# Patient Record
Sex: Male | Born: 1935
Health system: Southern US, Community
[De-identification: ages and names within clinical notes are randomized; demographics above are authoritative.]

## PROBLEM LIST (undated history)

## (undated) ENCOUNTER — Emergency Department (HOSPITAL_BASED_OUTPATIENT_CLINIC_OR_DEPARTMENT_OTHER): Payer: Medicare Other

## (undated) DIAGNOSIS — R768 Other specified abnormal immunological findings in serum: Secondary | ICD-10-CM

## (undated) DIAGNOSIS — I480 Paroxysmal atrial fibrillation: Secondary | ICD-10-CM

## (undated) DIAGNOSIS — D126 Benign neoplasm of colon, unspecified: Secondary | ICD-10-CM

## (undated) DIAGNOSIS — I10 Essential (primary) hypertension: Secondary | ICD-10-CM

## (undated) DIAGNOSIS — K5790 Diverticulosis of intestine, part unspecified, without perforation or abscess without bleeding: Secondary | ICD-10-CM

## (undated) DIAGNOSIS — G4733 Obstructive sleep apnea (adult) (pediatric): Secondary | ICD-10-CM

## (undated) HISTORY — DX: Essential (primary) hypertension: I10

## (undated) HISTORY — DX: Hemochromatosis, unspecified: E83.119

## (undated) HISTORY — DX: Obstructive sleep apnea (adult) (pediatric): G47.33

## (undated) HISTORY — DX: Diverticulosis of intestine, part unspecified, without perforation or abscess without bleeding: K57.90

## (undated) HISTORY — PX: KNEE ARTHROSCOPY: SUR90

## (undated) HISTORY — DX: Paroxysmal atrial fibrillation: I48.0

## (undated) HISTORY — DX: Benign neoplasm of colon, unspecified: D12.6

## (undated) HISTORY — PX: TONSILLECTOMY: SUR1361

## (undated) HISTORY — DX: Other specified abnormal immunological findings in serum: R76.8

## (undated) HISTORY — PX: COLONOSCOPY: SHX174

---

## 1987-10-08 ENCOUNTER — Encounter: Payer: Self-pay | Admitting: Gastroenterology

## 1988-11-10 ENCOUNTER — Encounter: Payer: Self-pay | Admitting: Gastroenterology

## 1988-11-24 ENCOUNTER — Encounter: Payer: Self-pay | Admitting: Gastroenterology

## 1990-06-18 ENCOUNTER — Encounter: Payer: Self-pay | Admitting: Gastroenterology

## 1993-10-03 DIAGNOSIS — D126 Benign neoplasm of colon, unspecified: Secondary | ICD-10-CM

## 1993-10-03 HISTORY — DX: Benign neoplasm of colon, unspecified: D12.6

## 1993-10-04 ENCOUNTER — Encounter: Payer: Self-pay | Admitting: Gastroenterology

## 1993-10-05 ENCOUNTER — Encounter: Payer: Self-pay | Admitting: Gastroenterology

## 1996-12-30 ENCOUNTER — Encounter: Payer: Self-pay | Admitting: Gastroenterology

## 2001-05-24 ENCOUNTER — Encounter (INDEPENDENT_AMBULATORY_CARE_PROVIDER_SITE_OTHER): Payer: Self-pay | Admitting: *Deleted

## 2002-02-27 ENCOUNTER — Encounter: Payer: Self-pay | Admitting: Gastroenterology

## 2002-04-07 ENCOUNTER — Encounter: Payer: Self-pay | Admitting: Gastroenterology

## 2006-04-14 ENCOUNTER — Encounter: Admission: RE | Admit: 2006-04-14 | Discharge: 2006-04-14 | Payer: Self-pay | Admitting: Orthopedic Surgery

## 2007-06-04 ENCOUNTER — Ambulatory Visit: Payer: Self-pay | Admitting: Gastroenterology

## 2007-06-11 ENCOUNTER — Ambulatory Visit: Payer: Self-pay | Admitting: Gastroenterology

## 2007-06-11 ENCOUNTER — Encounter: Payer: Self-pay | Admitting: Gastroenterology

## 2008-05-08 HISTORY — PX: NM MYOCAR PERF WALL MOTION: HXRAD629

## 2009-03-23 ENCOUNTER — Encounter: Payer: Self-pay | Admitting: Physician Assistant

## 2009-03-24 ENCOUNTER — Telehealth: Payer: Self-pay | Admitting: Gastroenterology

## 2009-03-25 ENCOUNTER — Ambulatory Visit: Payer: Self-pay | Admitting: Gastroenterology

## 2009-03-25 DIAGNOSIS — K5730 Diverticulosis of large intestine without perforation or abscess without bleeding: Secondary | ICD-10-CM | POA: Insufficient documentation

## 2009-03-25 DIAGNOSIS — I10 Essential (primary) hypertension: Secondary | ICD-10-CM | POA: Insufficient documentation

## 2009-03-25 DIAGNOSIS — Z860100 Personal history of colon polyps, unspecified: Secondary | ICD-10-CM | POA: Insufficient documentation

## 2009-03-25 DIAGNOSIS — R1011 Right upper quadrant pain: Secondary | ICD-10-CM

## 2009-03-29 ENCOUNTER — Ambulatory Visit (HOSPITAL_COMMUNITY): Admission: RE | Admit: 2009-03-29 | Discharge: 2009-03-29 | Payer: Self-pay | Admitting: Gastroenterology

## 2009-03-31 ENCOUNTER — Encounter: Payer: Self-pay | Admitting: Physician Assistant

## 2009-03-31 DIAGNOSIS — K8020 Calculus of gallbladder without cholecystitis without obstruction: Secondary | ICD-10-CM | POA: Insufficient documentation

## 2009-04-01 ENCOUNTER — Encounter: Payer: Self-pay | Admitting: Gastroenterology

## 2009-04-05 HISTORY — PX: LAPAROSCOPIC CHOLECYSTECTOMY: SUR755

## 2009-04-23 ENCOUNTER — Encounter (INDEPENDENT_AMBULATORY_CARE_PROVIDER_SITE_OTHER): Payer: Self-pay | Admitting: *Deleted

## 2009-04-23 ENCOUNTER — Ambulatory Visit (HOSPITAL_COMMUNITY): Admission: RE | Admit: 2009-04-23 | Discharge: 2009-04-23 | Payer: Self-pay | Admitting: General Surgery

## 2009-05-05 ENCOUNTER — Encounter: Payer: Self-pay | Admitting: Gastroenterology

## 2009-05-21 ENCOUNTER — Encounter: Payer: Self-pay | Admitting: Gastroenterology

## 2009-06-07 ENCOUNTER — Ambulatory Visit: Payer: Self-pay | Admitting: Gastroenterology

## 2009-06-07 DIAGNOSIS — R933 Abnormal findings on diagnostic imaging of other parts of digestive tract: Secondary | ICD-10-CM

## 2009-06-07 DIAGNOSIS — R972 Elevated prostate specific antigen [PSA]: Secondary | ICD-10-CM

## 2009-06-08 LAB — CONVERTED CEMR LAB
INR: 1.3 — ABNORMAL HIGH (ref 0.8–1.0)
PSA: 5.21 ng/mL — ABNORMAL HIGH (ref 0.10–4.00)
Prothrombin Time: 13.1 s — ABNORMAL HIGH (ref 9.1–11.7)

## 2009-06-11 DIAGNOSIS — R740 Nonspecific elevation of levels of transaminase and lactic acid dehydrogenase [LDH]: Secondary | ICD-10-CM

## 2009-06-11 LAB — CONVERTED CEMR LAB
Angiotensin 1 Converting Enzyme: 14 units/L (ref 9–67)
Anti Nuclear Antibody(ANA): POSITIVE — AB
HCV Ab: NEGATIVE
Hepatitis B Surface Ag: NEGATIVE

## 2009-06-23 ENCOUNTER — Encounter: Payer: Self-pay | Admitting: Gastroenterology

## 2009-06-23 ENCOUNTER — Ambulatory Visit (HOSPITAL_COMMUNITY): Admission: RE | Admit: 2009-06-23 | Discharge: 2009-06-23 | Payer: Self-pay | Admitting: Gastroenterology

## 2009-06-29 ENCOUNTER — Telehealth: Payer: Self-pay | Admitting: Gastroenterology

## 2009-07-07 ENCOUNTER — Ambulatory Visit: Payer: Self-pay | Admitting: Gastroenterology

## 2009-07-08 LAB — CONVERTED CEMR LAB
ALT: 36 units/L (ref 0–53)
AST: 37 units/L (ref 0–37)
Alkaline Phosphatase: 68 units/L (ref 39–117)
Bilirubin, Direct: 0.2 mg/dL (ref 0.0–0.3)
Eosinophils Absolute: 0.2 10*3/uL (ref 0.0–0.7)
Eosinophils Relative: 2.3 % (ref 0.0–5.0)
HCT: 43.8 % (ref 39.0–52.0)
Lymphs Abs: 2 10*3/uL (ref 0.7–4.0)
MCHC: 34.9 g/dL (ref 30.0–36.0)
MCV: 93.1 fL (ref 78.0–100.0)
Monocytes Absolute: 0.6 10*3/uL (ref 0.1–1.0)
Platelets: 196 10*3/uL (ref 150.0–400.0)
Prothrombin Time: 13.4 s — ABNORMAL HIGH (ref 9.1–11.7)
Total Bilirubin: 0.7 mg/dL (ref 0.3–1.2)
WBC: 7.6 10*3/uL (ref 4.5–10.5)

## 2009-07-21 ENCOUNTER — Ambulatory Visit: Payer: Self-pay | Admitting: Gastroenterology

## 2009-07-21 LAB — CONVERTED CEMR LAB
Basophils Relative: 0.1 % (ref 0.0–3.0)
Eosinophils Absolute: 0.2 10*3/uL (ref 0.0–0.7)
Hemoglobin: 15.2 g/dL (ref 13.0–17.0)
MCHC: 34.3 g/dL (ref 30.0–36.0)
MCV: 94.7 fL (ref 78.0–100.0)
Monocytes Absolute: 0.5 10*3/uL (ref 0.1–1.0)
Neutro Abs: 4.3 10*3/uL (ref 1.4–7.7)
Neutrophils Relative %: 65.2 % (ref 43.0–77.0)
RBC: 4.68 M/uL (ref 4.22–5.81)
RDW: 11.8 % (ref 11.5–14.6)

## 2009-08-04 ENCOUNTER — Ambulatory Visit: Payer: Self-pay | Admitting: Gastroenterology

## 2009-08-04 LAB — CONVERTED CEMR LAB
Basophils Absolute: 0 10*3/uL (ref 0.0–0.1)
Eosinophils Absolute: 0.1 10*3/uL (ref 0.0–0.7)
Lymphocytes Relative: 23.6 % (ref 12.0–46.0)
MCHC: 33.5 g/dL (ref 30.0–36.0)
Neutrophils Relative %: 65.4 % (ref 43.0–77.0)
RDW: 11.9 % (ref 11.5–14.6)

## 2009-08-18 ENCOUNTER — Ambulatory Visit: Payer: Self-pay | Admitting: Gastroenterology

## 2009-08-18 LAB — CONVERTED CEMR LAB
Basophils Absolute: 0 10*3/uL (ref 0.0–0.1)
Basophils Relative: 0.2 % (ref 0.0–3.0)
Hemoglobin: 14.9 g/dL (ref 13.0–17.0)
Lymphocytes Relative: 21.2 % (ref 12.0–46.0)
Monocytes Relative: 7 % (ref 3.0–12.0)
Neutro Abs: 4.2 10*3/uL (ref 1.4–7.7)
RBC: 4.63 M/uL (ref 4.22–5.81)
RDW: 11.7 % (ref 11.5–14.6)
WBC: 6 10*3/uL (ref 4.5–10.5)

## 2009-09-01 ENCOUNTER — Ambulatory Visit: Payer: Self-pay | Admitting: Gastroenterology

## 2009-09-01 LAB — CONVERTED CEMR LAB
Basophils Absolute: 0 10*3/uL (ref 0.0–0.1)
HCT: 46.3 % (ref 39.0–52.0)
Hemoglobin: 15.4 g/dL (ref 13.0–17.0)
Lymphs Abs: 1.4 10*3/uL (ref 0.7–4.0)
Monocytes Relative: 8.6 % (ref 3.0–12.0)
Neutro Abs: 4.1 10*3/uL (ref 1.4–7.7)
RDW: 11.7 % (ref 11.5–14.6)

## 2009-09-15 ENCOUNTER — Ambulatory Visit: Payer: Self-pay | Admitting: Gastroenterology

## 2009-09-15 LAB — CONVERTED CEMR LAB
Basophils Absolute: 0 10*3/uL (ref 0.0–0.1)
Eosinophils Absolute: 0.1 10*3/uL (ref 0.0–0.7)
HCT: 43.3 % (ref 39.0–52.0)
Lymphs Abs: 1.6 10*3/uL (ref 0.7–4.0)
MCV: 94.2 fL (ref 78.0–100.0)
Monocytes Absolute: 0.5 10*3/uL (ref 0.1–1.0)
Monocytes Relative: 7.2 % (ref 3.0–12.0)
Platelets: 167 10*3/uL (ref 150.0–400.0)
RDW: 12.4 % (ref 11.5–14.6)

## 2009-09-29 ENCOUNTER — Ambulatory Visit: Payer: Self-pay | Admitting: Gastroenterology

## 2009-09-29 LAB — CONVERTED CEMR LAB
Basophils Absolute: 0 10*3/uL
Basophils Relative: 0.4 %
Eosinophils Absolute: 0.1 10*3/uL
Eosinophils Relative: 1.4 %
HCT: 42.4 %
Hemoglobin: 14.4 g/dL
Lymphocytes Relative: 23.3 %
Lymphs Abs: 1.5 10*3/uL
MCHC: 34 g/dL
MCV: 94 fL
Monocytes Absolute: 0.6 10*3/uL
Monocytes Relative: 10.2 %
Neutro Abs: 4.1 10*3/uL
Neutrophils Relative %: 64.7 %
Platelets: 162 10*3/uL
RBC: 4.51 M/uL
RDW: 12 %
WBC: 6.3 10*3/uL

## 2009-10-13 ENCOUNTER — Ambulatory Visit: Payer: Self-pay | Admitting: Gastroenterology

## 2009-10-13 LAB — CONVERTED CEMR LAB
Basophils Relative: 0.5 % (ref 0.0–3.0)
Eosinophils Relative: 1.2 % (ref 0.0–5.0)
HCT: 42 % (ref 39.0–52.0)
Lymphs Abs: 1.2 10*3/uL (ref 0.7–4.0)
MCV: 93.1 fL (ref 78.0–100.0)
Monocytes Absolute: 0.6 10*3/uL (ref 0.1–1.0)
Monocytes Relative: 9.4 % (ref 3.0–12.0)
RBC: 4.51 M/uL (ref 4.22–5.81)
WBC: 6 10*3/uL (ref 4.5–10.5)

## 2009-10-27 ENCOUNTER — Ambulatory Visit: Payer: Self-pay | Admitting: Gastroenterology

## 2009-10-27 LAB — CONVERTED CEMR LAB
Basophils Relative: 0.3 % (ref 0.0–3.0)
Eosinophils Absolute: 0.1 10*3/uL (ref 0.0–0.7)
HCT: 41.2 % (ref 39.0–52.0)
Lymphs Abs: 1.1 10*3/uL (ref 0.7–4.0)
MCHC: 34.8 g/dL (ref 30.0–36.0)
MCV: 93.1 fL (ref 78.0–100.0)
Monocytes Absolute: 0.6 10*3/uL (ref 0.1–1.0)
Neutrophils Relative %: 72.7 % (ref 43.0–77.0)
RBC: 4.42 M/uL (ref 4.22–5.81)

## 2009-11-10 ENCOUNTER — Ambulatory Visit: Payer: Self-pay | Admitting: Gastroenterology

## 2009-11-10 LAB — CONVERTED CEMR LAB
Basophils Absolute: 0 10*3/uL (ref 0.0–0.1)
Basophils Relative: 0.6 % (ref 0.0–3.0)
Eosinophils Absolute: 0.2 10*3/uL (ref 0.0–0.7)
Lymphocytes Relative: 24.3 % (ref 12.0–46.0)
MCHC: 35.1 g/dL (ref 30.0–36.0)
MCV: 92.8 fL (ref 78.0–100.0)
Monocytes Absolute: 0.5 10*3/uL (ref 0.1–1.0)
Neutrophils Relative %: 63.1 % (ref 43.0–77.0)
Platelets: 182 10*3/uL (ref 150.0–400.0)
RBC: 4.42 M/uL (ref 4.22–5.81)
RDW: 12.7 % (ref 11.5–14.6)

## 2009-11-29 ENCOUNTER — Ambulatory Visit: Payer: Self-pay | Admitting: Gastroenterology

## 2009-11-29 LAB — CONVERTED CEMR LAB
Basophils Absolute: 0 10*3/uL (ref 0.0–0.1)
Basophils Relative: 0.3 % (ref 0.0–3.0)
Eosinophils Absolute: 0.3 10*3/uL (ref 0.0–0.7)
Iron: 101 ug/dL (ref 42–165)
Lymphocytes Relative: 24.4 % (ref 12.0–46.0)
MCHC: 34.9 g/dL (ref 30.0–36.0)
MCV: 92.6 fL (ref 78.0–100.0)
Monocytes Absolute: 0.6 10*3/uL (ref 0.1–1.0)
Neutrophils Relative %: 63.3 % (ref 43.0–77.0)
Platelets: 180 10*3/uL (ref 150.0–400.0)
RDW: 12.6 % (ref 11.5–14.6)
Saturation Ratios: 33.2 % (ref 20.0–50.0)
Transferrin: 217.6 mg/dL (ref 212.0–360.0)

## 2009-11-30 ENCOUNTER — Encounter (INDEPENDENT_AMBULATORY_CARE_PROVIDER_SITE_OTHER): Payer: Self-pay | Admitting: *Deleted

## 2009-12-02 ENCOUNTER — Telehealth: Payer: Self-pay | Admitting: Gastroenterology

## 2009-12-13 ENCOUNTER — Ambulatory Visit: Payer: Self-pay | Admitting: Gastroenterology

## 2009-12-13 LAB — CONVERTED CEMR LAB
Basophils Absolute: 0 10*3/uL (ref 0.0–0.1)
Basophils Relative: 0.4 % (ref 0.0–3.0)
Eosinophils Absolute: 0.1 10*3/uL (ref 0.0–0.7)
Hemoglobin: 12.9 g/dL — ABNORMAL LOW (ref 13.0–17.0)
Lymphocytes Relative: 22.6 % (ref 12.0–46.0)
MCHC: 35.3 g/dL (ref 30.0–36.0)
Monocytes Relative: 8.9 % (ref 3.0–12.0)
Neutro Abs: 4.4 10*3/uL (ref 1.4–7.7)
Neutrophils Relative %: 66 % (ref 43.0–77.0)
RBC: 3.93 M/uL — ABNORMAL LOW (ref 4.22–5.81)

## 2010-01-10 ENCOUNTER — Ambulatory Visit: Payer: Self-pay | Admitting: Gastroenterology

## 2010-01-10 LAB — CONVERTED CEMR LAB
Basophils Relative: 0.3 % (ref 0.0–3.0)
HCT: 37.5 % — ABNORMAL LOW (ref 39.0–52.0)
Hemoglobin: 13.1 g/dL (ref 13.0–17.0)
Lymphocytes Relative: 22.1 % (ref 12.0–46.0)
Lymphs Abs: 1.7 10*3/uL (ref 0.7–4.0)
MCHC: 35 g/dL (ref 30.0–36.0)
Monocytes Relative: 7.8 % (ref 3.0–12.0)
Neutro Abs: 5.2 10*3/uL (ref 1.4–7.7)
RBC: 4.02 M/uL — ABNORMAL LOW (ref 4.22–5.81)

## 2010-01-27 ENCOUNTER — Ambulatory Visit: Payer: Self-pay | Admitting: Gastroenterology

## 2010-02-09 ENCOUNTER — Ambulatory Visit: Payer: Self-pay | Admitting: Gastroenterology

## 2010-03-08 ENCOUNTER — Ambulatory Visit: Payer: Self-pay | Admitting: Gastroenterology

## 2010-03-08 LAB — CONVERTED CEMR LAB
Basophils Relative: 0.3 % (ref 0.0–3.0)
Eosinophils Absolute: 0 10*3/uL (ref 0.0–0.7)
Eosinophils Relative: 0.5 % (ref 0.0–5.0)
HCT: 39.8 % (ref 39.0–52.0)
Hemoglobin: 13.6 g/dL (ref 13.0–17.0)
Lymphs Abs: 1 10*3/uL (ref 0.7–4.0)
MCHC: 34.3 g/dL (ref 30.0–36.0)
MCV: 91 fL (ref 78.0–100.0)
Monocytes Absolute: 0.3 10*3/uL (ref 0.1–1.0)
Neutro Abs: 6.5 10*3/uL (ref 1.4–7.7)
RBC: 4.37 M/uL (ref 4.22–5.81)
WBC: 7.8 10*3/uL (ref 4.5–10.5)

## 2010-04-11 ENCOUNTER — Ambulatory Visit: Payer: Self-pay | Admitting: Gastroenterology

## 2010-04-12 LAB — CONVERTED CEMR LAB
Basophils Absolute: 0 10*3/uL (ref 0.0–0.1)
Basophils Relative: 0.5 % (ref 0.0–3.0)
Eosinophils Absolute: 0.1 10*3/uL (ref 0.0–0.7)
MCHC: 34.2 g/dL (ref 30.0–36.0)
MCV: 89.9 fL (ref 78.0–100.0)
Monocytes Absolute: 0.5 10*3/uL (ref 0.1–1.0)
Neutrophils Relative %: 64.3 % (ref 43.0–77.0)
Platelets: 181 10*3/uL (ref 150.0–400.0)
RBC: 4.11 M/uL — ABNORMAL LOW (ref 4.22–5.81)
RDW: 12.9 % (ref 11.5–14.6)

## 2010-05-18 ENCOUNTER — Encounter: Payer: Self-pay | Admitting: Gastroenterology

## 2010-06-17 ENCOUNTER — Other Ambulatory Visit: Payer: Self-pay | Admitting: Gastroenterology

## 2010-06-17 ENCOUNTER — Ambulatory Visit
Admission: RE | Admit: 2010-06-17 | Discharge: 2010-06-17 | Payer: Self-pay | Source: Home / Self Care | Attending: Gastroenterology | Admitting: Gastroenterology

## 2010-06-17 LAB — IBC PANEL
Iron: 58 ug/dL (ref 42–165)
Saturation Ratios: 15.6 % — ABNORMAL LOW (ref 20.0–50.0)
Transferrin: 265 mg/dL (ref 212.0–360.0)

## 2010-06-17 LAB — CBC WITH DIFFERENTIAL/PLATELET
Basophils Absolute: 0 10*3/uL (ref 0.0–0.1)
Basophils Relative: 0.5 % (ref 0.0–3.0)
Eosinophils Absolute: 0.1 10*3/uL (ref 0.0–0.7)
Eosinophils Relative: 1.6 % (ref 0.0–5.0)
HCT: 44.8 % (ref 39.0–52.0)
Hemoglobin: 15.3 g/dL (ref 13.0–17.0)
Lymphocytes Relative: 21.4 % (ref 12.0–46.0)
Lymphs Abs: 1.8 10*3/uL (ref 0.7–4.0)
MCHC: 34 g/dL (ref 30.0–36.0)
MCV: 88.2 fl (ref 78.0–100.0)
Monocytes Absolute: 0.6 10*3/uL (ref 0.1–1.0)
Monocytes Relative: 7.4 % (ref 3.0–12.0)
Neutro Abs: 5.7 10*3/uL (ref 1.4–7.7)
Neutrophils Relative %: 69.1 % (ref 43.0–77.0)
Platelets: 184 10*3/uL (ref 150.0–400.0)
RBC: 5.09 Mil/uL (ref 4.22–5.81)
RDW: 14.6 % (ref 11.5–14.6)
WBC: 8.3 10*3/uL (ref 4.5–10.5)

## 2010-06-17 LAB — FERRITIN: Ferritin: 13.9 ng/mL — ABNORMAL LOW (ref 22.0–322.0)

## 2010-07-07 NOTE — Letter (Signed)
Summary: [facility]   Imported By: Sherian Rein 07/05/2009 10:09:38  _____________________________________________________________________  External Attachment:    Type:   Image     Comment:   External Document

## 2010-07-07 NOTE — Procedures (Signed)
Summary: [facility]   Imported By: Sherian Rein 06/07/2009 12:00:39  _____________________________________________________________________  External Attachment:    Type:   Image     Comment:   External Document

## 2010-07-07 NOTE — Progress Notes (Signed)
Summary: Biopsy Results   Phone Note Call from Patient Call back at Home Phone [phone]   Caller: Patient Call For: Dr. Russella Dar Reason for Call: Talk to Nurse Summary of Call: Pt is calling about biopsy results Initial call taken by: Karna Christmas,  June 29, 2009 11:18 AM  Follow-up for Phone Call        Patient  aware Dr Russella Dar is out of the office this week as soon as he has a chance to review we will call patient back.  Patient would like to come in and see Dr Russella Dar to review.  REV scheduled for 07-07-09 1:45 Follow-up by: Darcey Nora RN, CGRN,  June 29, 2009 11:38 AM

## 2010-07-07 NOTE — Procedures (Signed)
Summary: Colonoscopy/[facility]  Colonoscopy/[facility]   Imported By: Sherian Rein 06/07/2009 11:58:54  _____________________________________________________________________  External Attachment:    Type:   Image     Comment:   External Document

## 2010-07-07 NOTE — Assessment & Plan Note (Signed)
Summary: f/u--ch.   History of Present Illness Visit Type: Follow-up Visit Primary GI MD: Elie Goody MD [facility] Primary Carlo Guevarra: Merri Brunette, MD Requesting Hava Massingale: na Chief Complaint: Patient here to follow up on his hemochromatosis, he states he is doing good.  History of Present Illness:   Andrew [name] returns for followup of microlithiasis. His ferritin and iron saturation have decreased substantially. He is now on monthly phlebotomies. He tolerates the phlebotomies without difficulty. Iron                      101 ug/dL                   65-784 Transferrin               217.6 mg/dL                 696.2-952.8 Iron Saturation           33.2 %        Ferritin                  33.8 ng/mL                  22.0-322.0  White Cell Count          7.7 K/uL                    4.5-10.5   Red Cell Count       [L]  4.02 Mil/uL                 4.22-5.81   Hemoglobin                13.1 g/dL                   41.3-24.4   Hematocrit           [L]  37.5 %                      39.0-52.0   MCV                       93.3 fl                     78.0-100.0   MCHC                      35.0 g/dL                   01.0-27.2   RDW                       12.4 %                      11.5-14.6   Platelet Count            164.0 K/uL                  150.0-400.0   GI Review of Systems      Denies abdominal pain, acid reflux, belching, bloating, chest pain, dysphagia with liquids, dysphagia with solids, heartburn, loss of appetite, nausea, vomiting, vomiting blood, weight loss, and  weight gain.        Denies anal fissure, black tarry stools, change in bowel habit, constipation, diarrhea, diverticulosis, fecal incontinence, heme positive  stool, hemorrhoids, irritable bowel syndrome, jaundice, light color stool, liver problems, rectal bleeding, and  rectal pain.   Current Medications (verified): 1)  Metoprolol Tartrate 50 Mg Tabs (Metoprolol Tartrate) .... Take 1 Tablet By Mouth Two Times A Day 2)   Lisinopril 40 Mg Tabs (Lisinopril) .... Take 1 Tablet By Mouth Once A Day 3)  Fish Oil Capsule (Unknown Dosage) .... Take 1 Capsule By Mouth Once Daily As Needed 4)  Multivitamins  Tabs (Multiple Vitamin) .... Take 1 Tablet By Mouth Once A Day 5)  Amlodipine Besylate 5 Mg Tabs (Amlodipine Besylate) .... Once Daily 6)  Prostate Drug .... Take One By Mouth Once Daily  Allergies (verified): No Known Drug Allergies  Past History:  Past Medical History: Hepatitis B antibody + Hypertension Diverticulosis Adenomatous Colon Polyps 10/1993 Hemochromatosis: COMPOUND HETEROZYGOUS FOR C282Y & H63D MUTATIONS  Past Surgical History: Reviewed history from 06/07/2009 and no changes required. Tonsillectomy Laparoscopic Cholecystectomy 04/2009 Knee Arthroscopy  Family History: Reviewed history from 03/25/2009 and no changes required. No FH of Colon Cancer:  Family History of Esophageal Cancer: Father  Social History: Reviewed history from 03/25/2009 and no changes required. Occupation: Retired Patient is a former smoker. -stopped at age 52 Alcohol Use - yes-2 glasses wine daily Daily Caffeine Use-3 cups daily Illicit Drug Use - no  Review of Systems       The patient complains of urination - excessive.         The pertinent positives and negatives are noted as above and in the HPI. All other ROS were reviewed and were negative.   Vital Signs:  Patient profile:   77 year old male Height:      71 inches Weight:      170.0 pounds BMI:     23.80 Pulse rate:   60 / minute Pulse rhythm:   irregularly irregular BP sitting:   140 / 80  (left arm) Cuff size:   regular  Vitals Entered By: Harlow Mares CMA Duncan Dull) (January 27, 2010 10:53 AM)  Physical Exam  General:  Well developed, well nourished, no acute distress. Head:  Normocephalic and atraumatic. Eyes:  PERRLA, no icterus. Mouth:  No deformity or lesions, dentition normal. Lungs:  Clear throughout to auscultation. Heart:   Regular rate and rhythm; no murmurs, rubs,  or bruits. Abdomen:  Soft, nontender and nondistended. No masses, hepatosplenomegaly or hernias noted. Normal bowel sounds. Neurologic:  Alert and  oriented x4;  grossly normal neurologically. Psych:  Alert and cooperative. Normal mood and affect.  Impression & Recommendations:  Problem # 1:  HEREDITARY HEMOCHROMATOSIS (ICD-275.01) I reviewed the results of his liver biopsy, hepatic serologies and quantitative iron level with him. He has genetic hemochromatosis: COMPOUND HETEROZYGOUS FOR C282Y & H63D MUTATIONS. There is a remote possibility he has superimposed autoimmune hepatitis, but this is less likely. Features of cirrhosis, were not noted on his liver biopsy although his liver appeared abnormal at laparoscopy for cholecystectomy. He is advised to have his first degree relatives screened for hemochromatosis. Continue therapeutic phlebotomies monthly for now. Will change to every other month and quarterly when appropriate with the goal to maintain mild iron deficiency anemia long-term.  Orders: TLB-Hepatic/Liver Function Pnl (80076-HEPATIC) TLB-CBC Platelet - w/Differential (85025-CBCD) TLB-PT (Protime) (85610-PTP) TLB-PTT (85730-PTTL) T-Phlebotomy, Therapuetic (16109)  Problem # 2:  PERSONAL HX COLONIC POLYPS (ICD-V12.72) Surveillance colonoscopy recommended January 2014.  Problem # 3:  DIVERTICULOSIS-COLON (ICD-562.10) Long-term high fiber diet with adequate daily water intake.  Patient Instructions: 1)  Remain  on monthly phlebotomies. 2)  Please schedule a follow-up appointment in 1 year. 3)  Copy sent to : Merri Brunette, MD 4)  The medication list was reviewed and reconciled.  All changed / newly prescribed medications were explained.  A complete medication list was provided to the patient / caregiver.

## 2010-07-07 NOTE — Letter (Signed)
Summary: Office Visit Letter  [facility]  [address] [place], [place] [address]   Phone: [phone]  Fax: [phone]      November 30, 2009 MRN: [id number]   Andrew [name] [address] [place], [place]  [address]   Dear Mr. Sou,   According to our records, it is time for you to schedule a follow-up office visit with [place].   At your convenience, please call [phone] (option #2)to schedule an office visit. If you have any questions, concerns, or feel that this letter is in error, we would appreciate your call.   Sincerely,  Judie Petit T. Russella Dar, M.D.  [facility] [phone]

## 2010-07-07 NOTE — Procedures (Signed)
Summary: Colonoscopy/[facility]  Colonoscopy/[facility]   Imported By: Sherian Rein 06/07/2009 11:57:45  _____________________________________________________________________  External Attachment:    Type:   Image     Comment:   External Document

## 2010-07-07 NOTE — Progress Notes (Signed)
Summary: Lab results   Phone Note Call from Patient Call back at [phone]   Caller: Patient Call For: Juliette Alcide Reason for Call: Lab or Test Results Summary of Call: Caling about Lab results from 11-29-09 Initial call taken by: Karna Christmas,  December 02, 2009 3:49 PM  Follow-up for Phone Call        patient notified, labs were normal Follow-up by: Darcey Nora RN, CGRN,  December 02, 2009 3:53 PM

## 2010-07-07 NOTE — Assessment & Plan Note (Signed)
Summary: follow up from /lk   History of Present Illness Visit Type: follow up Primary GI MD: Elie Goody MD [facility] Primary Provider: Merri Brunette, MD Requesting Provider: Avel Peace, MD Chief Complaint: follow up from cholecystectomy, no complaints History of Present Illness:   Andrew [name] returns following cholecystectomy with intraoperative cholangiogram. Dr. Abbey Chatters saw a diffusely nodular hepatic surface suspicious for cirrhotic changes. In reviewing his record, he has a history of mildly elevated transaminases in the early 1990s with serologies consistent with prior exposure to and subsequent immunity to hepatitis B. He also had a mildly depressed alpha 1 antitrypsin level at 186 as well as an elevated iron saturation 62.9 %, however, his TIBC was low at 251. Blood work performed in October 2010 showed a normal CBC and CMET, except for sodium of 134.   GI Review of Systems      Denies abdominal pain, acid reflux, belching, bloating, chest pain, dysphagia with liquids, dysphagia with solids, heartburn, loss of appetite, nausea, vomiting, vomiting blood, weight loss, and  weight gain.        Denies anal fissure, black tarry stools, change in bowel habit, constipation, diarrhea, diverticulosis, fecal incontinence, heme positive stool, hemorrhoids, irritable bowel syndrome, jaundice, light color stool, liver problems, rectal bleeding, and  rectal pain.   Current Medications (verified): 1)  Metoprolol Tartrate 50 Mg Tabs (Metoprolol Tartrate) .... Take 1 Tablet By Mouth Two Times A Day 2)  Lisinopril 40 Mg Tabs (Lisinopril) .... Take 1 Tablet By Mouth Once A Day 3)  Fish Oil Capsule (Unknown Dosage) .... Take 1 Capsule By Mouth Once Daily As Needed 4)  Multivitamins  Tabs (Multiple Vitamin) .... Take 1 Tablet By Mouth Once A Day  Allergies (verified): No Known Drug Allergies  Past History:  Past Medical History: Hepatitis B  antibody Hypertension Diverticulosis Adenomatous Colon Polyps 10/1993  Past Surgical History: Tonsillectomy Laparoscopic Cholecystectomy 04/2009 Knee Arthroscopy  Family History: Reviewed history from 03/25/2009 and no changes required. No FH of Colon Cancer:  Family History of Esophageal Cancer: Father  Social History: Reviewed history from 03/25/2009 and no changes required. Occupation: Retired Patient is a former smoker. -stopped at age 18 Alcohol Use - yes-2 glasses wine daily Daily Caffeine Use-3 cups daily Illicit Drug Use - no  Review of Systems       The pertinent positives and negatives are noted as above and in the HPI. All other ROS were reviewed and were negative.   Vital Signs:  Patient profile:   77 year old male Height:      71 inches Weight:      181 pounds BMI:     25.34 Pulse rate:   92 / minute Pulse rhythm:   irregular BP sitting:   124 / 82  (left arm) Cuff size:   regular  Vitals Entered By: Francee Piccolo CMA Duncan Dull) (June 07, 2009 8:52 AM)  Physical Exam  General:  Well developed, well nourished, no acute distress. Head:  Normocephalic and atraumatic. Eyes:  PERRLA, no icterus. Ears:  Normal auditory acuity. Mouth:  No deformity or lesions, dentition normal. Neck:  Supple; no masses or thyromegaly. Lungs:  Clear throughout to auscultation. Heart:  Regular rate and rhythm; no murmurs, rubs,  or bruits. Abdomen:  Soft, nontender and nondistended. No masses, hepatosplenomegaly or hernias noted. Normal bowel sounds. Msk:  Symmetrical with no gross deformities. Normal posture. Pulses:  Normal pulses noted. Extremities:  No clubbing, cyanosis, edema or deformities noted. Neurologic:  Alert  and  oriented x4;  grossly normal neurologically. Cervical Nodes:  No significant cervical adenopathy. Inguinal Nodes:  No significant inguinal adenopathy. Psych:  Alert and cooperative. Normal mood and affect.  Impression &  Recommendations:  Problem # 1:  NONSPECIFIC ABN FINDING RAD & OTH EXAM GI TRACT (ICD-793.4) Further evaluation for suspected cirrhosis. Possible hemachromatosis or alpha-1 antitrypsin deficiency. Serological evaluation as below. We discussed the risks, benefits, and alternatives to an ultrasound-guided percutaneous liver biopsy, which will be considered pending results of his blood work. Orders: TLB-Ferritin (82728-FER) TLB-IBC Pnl (Iron/FE;Transferrin) (83550-IBC) TLB-Iron, (Fe) Total (83540-FE) TLB-PT (Protime) (85610-PTP) T-Alpha-1-Antitrypsin Tot (16109-60454) T-AMA [phone]) T-ANA [id number]) T-Anti SMA (57846-96295) T-Ceruloplasmin ([phone]) T-Hepatitis B Surface Antibody (02725-36644) T-Hepatitis B Surface Antigen [phone]) T-Hepatitis C Anti HCV (38756) T-Angiotensin i-Converting Enzyme (43329-51884) T-Hemochromatosis [phone])  Problem # 2:  PERSONAL HX COLONIC POLYPS (ICD-V12.72) Adenomatous colon polyps, initially diagnosed in May 1995. Surveillance colonoscopy recommended January 2014.  Other Orders: TLB-PSA (Prostate Specific Antigen) (84153-PSA)  Patient Instructions: 1)  Get your labs drawn today in the basement.  2)  Please schedule a follow-up appointment in 2 weeks.  3)  Copy sent to : Merri Brunette, MD 4)                        Avel Peace, MD 5)  The medication list was reviewed and reconciled.  All changed / newly prescribed medications were explained.  A complete medication list was provided to the patient / caregiver.

## 2010-07-07 NOTE — Progress Notes (Signed)
[facility]   Imported By: Sherian Rein 06/07/2009 12:05:48  _____________________________________________________________________  External Attachment:    Type:   Image     Comment:   External Document

## 2010-07-07 NOTE — Assessment & Plan Note (Signed)
Summary: discuss liver bx results/sheri   History of Present Illness Visit Type: follow up Primary GI MD: Elie Goody MD [facility] Primary Provider: Merri Brunette, MD Requesting Provider: Avel Peace, MD Chief Complaint: Patient to discuss results of liver biopsy History of Present Illness:   Andrew [name] returns for followup of liver disease. Serologies revealed a mildly elevated ANA at 1:80. Liver biopsy revealed chronic active hepatitis with significant iron deposition. Typical features of autoimmune hepatitis were not identified. Cirrhosis was not identified on liver biopsy. Quantitative hepaticiron level were elevated.   GI Review of Systems      Denies abdominal pain, acid reflux, belching, bloating, chest pain, dysphagia with liquids, dysphagia with solids, heartburn, loss of appetite, nausea, vomiting, vomiting blood, weight loss, and  weight gain.      Reports liver problems.     Denies anal fissure, black tarry stools, change in bowel habit, constipation, diarrhea, diverticulosis, fecal incontinence, heme positive stool, hemorrhoids, irritable bowel syndrome, jaundice, light color stool, rectal bleeding, and  rectal pain.   Current Medications (verified): 1)  Metoprolol Tartrate 50 Mg Tabs (Metoprolol Tartrate) .... Take 1 Tablet By Mouth Two Times A Day 2)  Lisinopril 40 Mg Tabs (Lisinopril) .... Take 1 Tablet By Mouth Once A Day 3)  Fish Oil Capsule (Unknown Dosage) .... Take 1 Capsule By Mouth Once Daily As Needed 4)  Multivitamins  Tabs (Multiple Vitamin) .... Take 1 Tablet By Mouth Once A Day 5)  Amlodipine Besylate 5 Mg Tabs (Amlodipine Besylate) .... Once Daily  Allergies (verified): No Known Drug Allergies  Past History:  Past Medical History: Hepatitis B antibody + Hypertension Diverticulosis Adenomatous Colon Polyps 10/1993 Hemochromatosis: COMPOUND HETEROZYGOUS FOR THE C282Y & H63D MUTATIONS  Past Surgical History: Reviewed history from 06/07/2009 and no  changes required. Tonsillectomy Laparoscopic Cholecystectomy 04/2009 Knee Arthroscopy  Family History: Reviewed history from 03/25/2009 and no changes required. No FH of Colon Cancer:  Family History of Esophageal Cancer: Father  Social History: Reviewed history from 03/25/2009 and no changes required. Occupation: Retired Patient is a former smoker. -stopped at age 9 Alcohol Use - yes-2 glasses wine daily Daily Caffeine Use-3 cups daily Illicit Drug Use - no  Review of Systems       The patient complains of itching and urination - excessive.  The patient denies allergy/sinus, anemia, anxiety-new, arthritis/joint pain, back pain, blood in urine, breast changes/lumps, change in vision, confusion, cough, coughing up blood, depression-new, fainting, fatigue, fever, headaches-new, hearing problems, heart murmur, heart rhythm changes, menstrual pain, muscle pains/cramps, night sweats, nosebleeds, pregnancy symptoms, shortness of breath, skin rash, sleeping problems, sore throat, swelling of feet/legs, swollen lymph glands, thirst - excessive , urination - excessive , urination changes/pain, urine leakage, vision changes, and voice change.    Vital Signs:  Patient profile:   77 year old male Height:      71 inches Weight:      181.25 pounds BMI:     25.37 Pulse rate:   60 / minute Pulse rhythm:   regular BP sitting:   120 / 68  (right arm) Cuff size:   regular  Vitals Entered By: June McMurray CMA Duncan Dull) (July 07, 2009 1:53 PM)  Physical Exam  General:  Well developed, well nourished, no acute distress. Head:  Normocephalic and atraumatic. Mouth:  No deformity or lesions, dentition normal. Lungs:  Clear throughout to auscultation. Heart:  Regular rate and rhythm; no murmurs, rubs,  or bruits. Abdomen:  Soft, nontender and nondistended.  No masses, hepatosplenomegaly or hernias noted. Normal bowel sounds. Psych:  Alert and cooperative. Normal mood and affect.  Impression &  Recommendations:  Problem # 1:  HEREDITARY HEMOCHROMATOSIS (ICD-275.01) I reviewed the results of his liver biopsy, hepatic serologies and quantitative iron level with him. He has genetic hemochromatosis: COMPOUND HETEROZYGOUS FOR THE C282Y & H63D MUTATIONS. There is a remote possibility he has superimposed autoimmune hepatitis, but this is less likely. Features of cirrhosis, were not noted on his liver biopsy although his liver appeared abnormal at laparoscopy for cholecystectomy. He is advised to have his first degree relatives screened for hemochromatosis. Begin therapeutic phlebotomies every other week for 2-3 months and then every month or every other month with the goal to induce and maintained mild iron deficiency anemia long-term. Phlebotomy regimen will be adjusted based on response. Orders: TLB-Hepatic/Liver Function Pnl (80076-HEPATIC) TLB-CBC Platelet - w/Differential (85025-CBCD) TLB-PT (Protime) (85610-PTP) TLB-PTT (85730-PTTL) T-Phlebotomy, Therapuetic (09811)  Patient Instructions: 1)  You have been schedule for your phlebotomy every month for 3 months then you will go to the lab every other month.  2)  You need to go to the lab today to have labs drawn.  3)  Please schedule a follow-up appointment in 6 months. 4)  Copy sent to : Merri Brunette, MD 5)                        Avel Peace, MD 6)  The medication list was reviewed and reconciled.  All changed / newly prescribed medications were explained.  A complete medication list was provided to the patient / caregiver.

## 2010-08-15 ENCOUNTER — Other Ambulatory Visit: Payer: Self-pay

## 2010-08-15 ENCOUNTER — Other Ambulatory Visit: Payer: Self-pay | Admitting: Gastroenterology

## 2010-08-15 ENCOUNTER — Encounter (INDEPENDENT_AMBULATORY_CARE_PROVIDER_SITE_OTHER): Payer: Self-pay | Admitting: *Deleted

## 2010-08-15 LAB — CBC WITH DIFFERENTIAL/PLATELET
Basophils Absolute: 0 10*3/uL (ref 0.0–0.1)
Eosinophils Absolute: 0.1 10*3/uL (ref 0.0–0.7)
MCHC: 33.5 g/dL (ref 30.0–36.0)
MCV: 87.5 fl (ref 78.0–100.0)
Monocytes Absolute: 0.6 10*3/uL (ref 0.1–1.0)
Neutrophils Relative %: 69.5 % (ref 43.0–77.0)
Platelets: 179 10*3/uL (ref 150.0–400.0)
RDW: 14.1 % (ref 11.5–14.6)
WBC: 8.1 10*3/uL (ref 4.5–10.5)

## 2010-08-22 LAB — CBC
RBC: 4.92 MIL/uL (ref 4.22–5.81)
WBC: 5.9 10*3/uL (ref 4.0–10.5)

## 2010-08-22 LAB — PROTIME-INR
INR: 1.24 (ref 0.00–1.49)
Prothrombin Time: 15.5 seconds — ABNORMAL HIGH (ref 11.6–15.2)

## 2010-09-07 LAB — BASIC METABOLIC PANEL
CO2: 31 mEq/L (ref 19–32)
Chloride: 101 mEq/L (ref 96–112)
GFR calc non Af Amer: >60 mL/min (ref >60)
Glucose, Bld: 111 mg/dL — ABNORMAL HIGH (ref 70–99)
Potassium: 4.1 mEq/L (ref 3.5–5.1)
Sodium: 139 mEq/L (ref 135–145)

## 2010-09-07 LAB — CBC
Platelets: 171 10*3/uL (ref 150–400)
RBC: 4.89 MIL/uL (ref 4.22–5.81)
WBC: 6.5 10*3/uL (ref 4.0–10.5)

## 2010-10-17 ENCOUNTER — Other Ambulatory Visit (INDEPENDENT_AMBULATORY_CARE_PROVIDER_SITE_OTHER): Payer: Medicare Other

## 2010-10-17 ENCOUNTER — Telehealth: Payer: Self-pay

## 2010-10-17 NOTE — Telephone Encounter (Signed)
Put standing orders in for labs

## 2010-10-18 ENCOUNTER — Telehealth: Payer: Self-pay

## 2010-10-18 NOTE — Telephone Encounter (Signed)
I have left a voicemail for the patient that will need to change phlebotomies to q 3 months.  Next due 01/17/11

## 2010-10-18 NOTE — Telephone Encounter (Signed)
Message copied by Darcey Nora on Tue Oct 18, 2010 11:32 AM ------      Message from: Claudette Head      Created: Tue Oct 18, 2010 10:38 AM       We can go to every 3rd month phlebotomies now

## 2011-01-17 ENCOUNTER — Other Ambulatory Visit (INDEPENDENT_AMBULATORY_CARE_PROVIDER_SITE_OTHER): Payer: Medicare Other

## 2011-01-17 ENCOUNTER — Other Ambulatory Visit: Payer: Self-pay

## 2011-01-17 LAB — HEMATOCRIT: HCT: 44.3 % (ref 39.0–52.0)

## 2011-01-17 LAB — HEMOGLOBIN: Hemoglobin: 15 g/dL (ref 13.0–17.0)

## 2011-01-18 ENCOUNTER — Telehealth: Payer: Self-pay

## 2011-01-18 NOTE — Telephone Encounter (Signed)
Patient advised of results and to continue q 3 month phlebotomy.

## 2011-01-18 NOTE — Telephone Encounter (Signed)
Message copied by Annett Fabian on Wed Jan 18, 2011 11:22 AM ------      Message from: Claudette Head T      Created: Wed Jan 18, 2011  8:45 AM       Continue q3 mo phlebotomies      Fe/TIBC/ferritin with next phlebotomy if not already ordered

## 2011-01-18 NOTE — Telephone Encounter (Signed)
No answer and no machine I will continue to try and reach the patient. 

## 2011-04-18 ENCOUNTER — Other Ambulatory Visit (INDEPENDENT_AMBULATORY_CARE_PROVIDER_SITE_OTHER): Payer: Medicare Other

## 2011-04-19 ENCOUNTER — Encounter: Payer: Self-pay | Admitting: Gastroenterology

## 2011-04-19 LAB — IBC PANEL
Iron: 155 ug/dL (ref 42–165)
Saturation Ratios: 44.1 % (ref 20.0–50.0)
Transferrin: 251.2 mg/dL (ref 212.0–360.0)

## 2011-04-19 NOTE — Telephone Encounter (Signed)
ERROR

## 2011-06-12 DIAGNOSIS — H2510 Age-related nuclear cataract, unspecified eye: Secondary | ICD-10-CM | POA: Diagnosis not present

## 2011-06-12 DIAGNOSIS — H40019 Open angle with borderline findings, low risk, unspecified eye: Secondary | ICD-10-CM | POA: Diagnosis not present

## 2011-06-12 DIAGNOSIS — H02839 Dermatochalasis of unspecified eye, unspecified eyelid: Secondary | ICD-10-CM | POA: Diagnosis not present

## 2011-06-12 DIAGNOSIS — I10 Essential (primary) hypertension: Secondary | ICD-10-CM | POA: Diagnosis not present

## 2011-06-13 DIAGNOSIS — N401 Enlarged prostate with lower urinary tract symptoms: Secondary | ICD-10-CM | POA: Diagnosis not present

## 2011-06-28 ENCOUNTER — Other Ambulatory Visit: Payer: Self-pay | Admitting: Dermatology

## 2011-06-28 DIAGNOSIS — D239 Other benign neoplasm of skin, unspecified: Secondary | ICD-10-CM | POA: Diagnosis not present

## 2011-06-28 DIAGNOSIS — L821 Other seborrheic keratosis: Secondary | ICD-10-CM | POA: Diagnosis not present

## 2011-06-28 DIAGNOSIS — L570 Actinic keratosis: Secondary | ICD-10-CM | POA: Diagnosis not present

## 2011-07-04 DIAGNOSIS — I10 Essential (primary) hypertension: Secondary | ICD-10-CM | POA: Diagnosis not present

## 2011-07-04 DIAGNOSIS — R350 Frequency of micturition: Secondary | ICD-10-CM | POA: Diagnosis not present

## 2011-07-04 DIAGNOSIS — Z125 Encounter for screening for malignant neoplasm of prostate: Secondary | ICD-10-CM | POA: Diagnosis not present

## 2011-07-11 ENCOUNTER — Other Ambulatory Visit: Payer: Medicare Other

## 2011-07-12 DIAGNOSIS — L570 Actinic keratosis: Secondary | ICD-10-CM | POA: Diagnosis not present

## 2011-07-12 DIAGNOSIS — R7309 Other abnormal glucose: Secondary | ICD-10-CM | POA: Diagnosis not present

## 2011-07-12 DIAGNOSIS — I10 Essential (primary) hypertension: Secondary | ICD-10-CM | POA: Diagnosis not present

## 2011-07-12 DIAGNOSIS — R319 Hematuria, unspecified: Secondary | ICD-10-CM | POA: Diagnosis not present

## 2011-07-12 DIAGNOSIS — I4891 Unspecified atrial fibrillation: Secondary | ICD-10-CM | POA: Diagnosis not present

## 2011-07-14 DIAGNOSIS — I4891 Unspecified atrial fibrillation: Secondary | ICD-10-CM | POA: Diagnosis not present

## 2011-07-14 DIAGNOSIS — Z7901 Long term (current) use of anticoagulants: Secondary | ICD-10-CM | POA: Diagnosis not present

## 2011-07-14 DIAGNOSIS — I10 Essential (primary) hypertension: Secondary | ICD-10-CM | POA: Diagnosis not present

## 2011-07-26 ENCOUNTER — Telehealth: Payer: Self-pay | Admitting: Gastroenterology

## 2011-07-27 DIAGNOSIS — I4891 Unspecified atrial fibrillation: Secondary | ICD-10-CM | POA: Diagnosis not present

## 2011-07-27 HISTORY — PX: US ECHOCARDIOGRAPHY: HXRAD669

## 2011-07-27 NOTE — Telephone Encounter (Signed)
I am not sure how phlebotomy is handled on anticoagulation. Would you please check with our lab and hospital lab

## 2011-07-27 NOTE — Telephone Encounter (Signed)
Per the lab there is no change in the process due to anticoagulants.  They will just have him apply pressure after the procedure longer.

## 2011-07-27 NOTE — Telephone Encounter (Signed)
OK. Please be sure the patient and lab are aware that he needs the extra pressure post phlebotomy.

## 2011-07-27 NOTE — Telephone Encounter (Signed)
Patient has been recently started on xarelto recently due to new diagnosis of a-fib. He is due for phlebotomy now.  His cardiologist wanted him to check with Dr Russella Dar prior to having phlebotomy.  Dr Russella Dar please advise

## 2011-07-27 NOTE — Telephone Encounter (Signed)
Patient aware and lab is also aware

## 2011-08-03 DIAGNOSIS — I4891 Unspecified atrial fibrillation: Secondary | ICD-10-CM | POA: Diagnosis not present

## 2011-08-03 DIAGNOSIS — I10 Essential (primary) hypertension: Secondary | ICD-10-CM | POA: Diagnosis not present

## 2011-08-07 DIAGNOSIS — I4891 Unspecified atrial fibrillation: Secondary | ICD-10-CM | POA: Diagnosis not present

## 2011-08-07 DIAGNOSIS — Z7901 Long term (current) use of anticoagulants: Secondary | ICD-10-CM | POA: Diagnosis not present

## 2011-08-11 ENCOUNTER — Encounter (HOSPITAL_COMMUNITY): Payer: Self-pay | Admitting: Pharmacy Technician

## 2011-08-11 ENCOUNTER — Other Ambulatory Visit: Payer: Self-pay | Admitting: Cardiovascular Disease

## 2011-08-14 ENCOUNTER — Other Ambulatory Visit: Payer: Self-pay | Admitting: Cardiovascular Disease

## 2011-08-14 ENCOUNTER — Ambulatory Visit
Admission: RE | Admit: 2011-08-14 | Discharge: 2011-08-14 | Disposition: A | Discharge status: Home / Self Care | Payer: Medicare Other | Source: Ambulatory Visit | Attending: Cardiovascular Disease | Admitting: Cardiovascular Disease

## 2011-08-14 DIAGNOSIS — I119 Hypertensive heart disease without heart failure: Secondary | ICD-10-CM | POA: Diagnosis not present

## 2011-08-14 DIAGNOSIS — D689 Coagulation defect, unspecified: Secondary | ICD-10-CM | POA: Diagnosis not present

## 2011-08-14 DIAGNOSIS — Z01811 Encounter for preprocedural respiratory examination: Secondary | ICD-10-CM

## 2011-08-14 DIAGNOSIS — I4891 Unspecified atrial fibrillation: Secondary | ICD-10-CM | POA: Diagnosis not present

## 2011-08-14 DIAGNOSIS — Z01818 Encounter for other preprocedural examination: Secondary | ICD-10-CM | POA: Diagnosis not present

## 2011-08-17 MED ORDER — DEXTROSE-NACL 5-0.45 % IV SOLN: INTRAVENOUS | Status: DC

## 2011-08-18 ENCOUNTER — Other Ambulatory Visit: Payer: Self-pay

## 2011-08-18 ENCOUNTER — Encounter (HOSPITAL_COMMUNITY)
Admission: RE | Disposition: A | Discharge status: Home / Self Care | Payer: Self-pay | Source: Ambulatory Visit | Attending: Cardiovascular Disease

## 2011-08-18 ENCOUNTER — Ambulatory Visit (HOSPITAL_COMMUNITY): Payer: Medicare Other | Admitting: Anesthesiology

## 2011-08-18 ENCOUNTER — Encounter (HOSPITAL_COMMUNITY): Payer: Self-pay | Admitting: Anesthesiology

## 2011-08-18 ENCOUNTER — Ambulatory Visit (HOSPITAL_COMMUNITY)
Admission: RE | Admit: 2011-08-18 | Discharge: 2011-08-18 | Disposition: A | Discharge status: Home / Self Care | Payer: Medicare Other | Source: Ambulatory Visit | Attending: Cardiovascular Disease | Admitting: Cardiovascular Disease

## 2011-08-18 DIAGNOSIS — I10 Essential (primary) hypertension: Secondary | ICD-10-CM | POA: Diagnosis not present

## 2011-08-18 DIAGNOSIS — Z7901 Long term (current) use of anticoagulants: Secondary | ICD-10-CM | POA: Insufficient documentation

## 2011-08-18 DIAGNOSIS — I4891 Unspecified atrial fibrillation: Secondary | ICD-10-CM | POA: Insufficient documentation

## 2011-08-18 HISTORY — PX: CARDIOVERSION: SHX1299

## 2011-08-18 SURGERY — CARDIOVERSION
Anesthesia: Monitor Anesthesia Care | Wound class: Clean

## 2011-08-18 MED ORDER — HYDROCORTISONE 1 % EX CREA: 1.0000 "application " | TOPICAL_CREAM | Freq: Three times a day (TID) | CUTANEOUS | Status: DC | PRN

## 2011-08-18 MED ORDER — SODIUM CHLORIDE 0.9 % IJ SOLN: 3.0000 mL | Freq: Two times a day (BID) | INTRAMUSCULAR | Status: DC

## 2011-08-18 MED ORDER — HYDROCORTISONE 1 % EX CREA
TOPICAL_CREAM | CUTANEOUS | Status: AC
  Administered 2011-08-18: 12:00:00
  Filled 2011-08-18: qty 28

## 2011-08-18 MED ORDER — SODIUM CHLORIDE 0.9 % IV SOLN: 250.0000 mL | INTRAVENOUS | Status: DC

## 2011-08-18 MED ORDER — PROPOFOL 10 MG/ML IV BOLUS
INTRAVENOUS | Status: DC | PRN
  Administered 2011-08-18: 60 mg via INTRAVENOUS

## 2011-08-18 MED ORDER — SODIUM CHLORIDE 0.9 % IJ SOLN
3.0000 mL | INTRAMUSCULAR | Status: DC | PRN
Start: 2011-08-18 — End: 2011-08-18

## 2011-08-18 NOTE — Anesthesia Procedure Notes (Signed)
Procedure Name: MAC Date/Time: 08/18/2011 11:55 AM Performed by: Leona Singleton A Pre-anesthesia Checklist: Patient identified Patient Re-evaluated:Patient Re-evaluated prior to inductionOxygen Delivery Method: Ambu bag Preoxygenation: Pre-oxygenation with 100% oxygen Ventilation: Mask ventilation without difficulty

## 2011-08-18 NOTE — Transfer of Care (Signed)
Immediate Anesthesia Transfer of Care Note  Patient: Andrew [name]  Procedure(s) Performed: Procedure(s) (LRB): CARDIOVERSION (N/A)  Patient Location: PACU and Short Stay  Anesthesia Type: MAC  Level of Consciousness: awake, alert , oriented and patient cooperative  Airway & Oxygen Therapy: Patient Spontanous Breathing and Patient connected to nasal cannula oxygen  Post-op Assessment: Report given to PACU RN and Post -op Vital signs reviewed and stable  Post vital signs: Reviewed and stable  Complications: No apparent anesthesia complications

## 2011-08-18 NOTE — Discharge Instructions (Signed)
Electrical Cardioversion Cardioversion is the delivery of a jolt of electricity to change the rhythm of the heart. Sticky patches or metal paddles are placed on the chest to deliver the electricity from a special device. This is done to restore a normal rhythm. A rhythm that is too fast or not regular keeps the heart from pumping well. Compared to medicines used to change an abnormal rhythm, cardioversion is faster and works better. It is also unpleasant and may dislodge blood clots from the heart. WHEN WOULD THIS BE DONE?  In an emergency:   There is low or no blood pressure as a result of the heart rhythm.   Normal rhythm must be restored as fast as possible to protect the brain and heart from further damage.   It may save a life.   For less serious heart rhythms, such as atrial fibrillation or flutter, in which:   The heart is beating too fast or is not regular.   The heart is still able to pump enough blood, but not as well as it should.   Medicine to change the rhythm has not worked.   It is safe to wait in order to allow time for preparation.  LET YOUR CAREGIVER KNOW ABOUT:   Every medicine you are taking. It is very important to do this! Know when to take or stop taking any of them.   Any time in the past that you have felt your heart was not beating normally.  RISKS AND COMPLICATIONS   Clots may form in the chambers of the heart if it is beating too fast. These clots may be dislodged during the procedure and travel to other parts of the body.   There is risk of a stroke during and after the procedure if a clot moves. Blood thinners lower this risk.   You may have a special test of your heart (TEE) to make sure there are no clots in your heart.  BEFORE THE PROCEDURE   You may have some tests to see how well your heart is working.   You may start taking blood thinners so your blood does not clot as easily.   Other drugs may be given to help your heart work better.   PROCEDURE (SCHEDULED)  The procedure is typically done in a hospital by a heart doctor (cardiologist).   You will be told when and where to go.   You may be given some medicine through an intravenous (IV) access to reduce discomfort and make you sleepy before the procedure.   Your whole body may move when the shock is delivered. Your chest may feel sore.   You may be able to go home after a few hours. Your heart rhythm will be watched to make sure it does not change.  HOME CARE INSTRUCTIONS   Only take medicine as directed by your caregiver. Be sure you understand how and when to take your medicine.   Learn how to feel your pulse and check it often.   Limit your activity for 48 hours.   Avoid caffeine and other stimulants as directed.  SEEK MEDICAL CARE IF:   You feel like your heart is beating too fast or your pulse is not regular.   You have any questions about your medicines.   You have bleeding that will not stop.  SEEK IMMEDIATE MEDICAL CARE IF:   You are dizzy or feel faint.   It is hard to breathe or you feel short of breath.     There is a change in discomfort in your chest.   Your speech is slurred or you have trouble moving your arm or leg on one side.   You get a muscle cramp.   Your fingers or toes turn cold or blue.  MAKE SURE YOU:   Understand these instructions.   Will watch your condition.   Will get help right away if you are not doing well or get worse.  Document Released: 05/12/2002 Document Revised: 05/11/2011 Document Reviewed: 09/11/2007 Exit[employer] Patient Information 2012 [employer]. 

## 2011-08-18 NOTE — H&P (Signed)
  Updated H&P: See dictated office notes from 08/07/11 and 07/14/11.  Pt has been on anticoagulation with Xarelto since 07/12/11.  He presents now for DC Cardioversion.  ECG today AFib at 76.  Labs reviewed.  Consent obtained. Tyrah Broers A 08/18/2011 11:50 AM

## 2011-08-18 NOTE — Preoperative (Signed)
Beta Blockers   Reason not to administer Beta Blockers:Not Applicable 

## 2011-08-18 NOTE — CV Procedure (Signed)
[facility]  CARDIOVERSION NOTE   Procedure: Electrical Cardioversion Indications:  Atrial Fibrillation  Procedure Details:  Consent: Obtained  Time Out: Verified patient identification, verified procedure, site/side was marked, verified correct patient position, special equipment/implants available, medications/allergies/relevent history reviewed, required imaging and test results available.     ECG pre  Procedure:  A Fib at 78  Patient placed on cardiac monitor, pulse oximetry, supplemental oxygen as necessary.  Sedation given: 60 mg propofol per Dr. Malen Gauze Pacer pads placed anterior and posterior chest.  Cardioverted 1 time(s).  Cardioverted at 120J.  Evaluation: Findings: Post procedure EKG shows: NSR @60  Complications: None Patient did tolerate procedure well.     Lennette Bihari, MD, [facility] 08/18/2011 11:58 AM

## 2011-08-18 NOTE — Anesthesia Preprocedure Evaluation (Addendum)
Anesthesia Evaluation  Patient identified by MRN, date of birth, ID band Patient awake    Reviewed: Allergy & Precautions, H&P , NPO status , Patient's Chart, lab work & pertinent test results, reviewed documented beta blocker date and time   History of Anesthesia Complications Negative for: history of anesthetic complications  Airway Mallampati: II TM Distance: >3 FB Neck ROM: Full    Dental  (+) Teeth Intact and Dental Advisory Given   Pulmonary former smoker         Cardiovascular Exercise Tolerance: Poor hypertension, Pt. on medications and Pt. on home beta blockers + dysrhythmias Atrial Fibrillation Rhythm:Irregular Rate:Abnormal     Neuro/Psych negative neurological ROS  negative psych ROS   GI/Hepatic negative GI ROS, (+) Hepatitis -, BHemochromatosis    Endo/Other  negative endocrine ROS  Renal/GU negative Renal ROS  negative genitourinary   Musculoskeletal negative musculoskeletal ROS (+)   Abdominal   Peds  Hematology  (+) Blood dyscrasia, ,   Anesthesia Other Findings   Reproductive/Obstetrics negative OB ROS                          Anesthesia Physical Anesthesia Plan  ASA: II  Anesthesia Plan: MAC   Post-op Pain Management:    Induction: Intravenous  Airway Management Planned: Mask  Additional Equipment:   Intra-op Plan:   Post-operative Plan:   Informed Consent: I have reviewed the patients History and Physical, chart, labs and discussed the procedure including the risks, benefits and alternatives for the proposed anesthesia with the patient or authorized representative who has indicated his/her understanding and acceptance.   Dental advisory given and History available from chart only  Plan Discussed with: Anesthesiologist  Anesthesia Plan Comments:         Anesthesia Quick Evaluation

## 2011-08-20 NOTE — Anesthesia Postprocedure Evaluation (Signed)
  Anesthesia Post-op Note  Patient: Andrew [name]  Procedure(s) Performed: Procedure(s) (LRB): CARDIOVERSION (N/A)  Patient Location:Short Stay C  Anesthesia Type: MAC  Level of Consciousness: awake, alert  and oriented  Airway and Oxygen Therapy: Patient Spontanous Breathing  Post-op Pain: none  Post-op Assessment: Post-op Vital signs reviewed, Patient's Cardiovascular Status Stable, Respiratory Function Stable and Patent Airway  Post-op Vital Signs: Reviewed and stable  Complications: No apparent anesthesia complications

## 2011-08-22 ENCOUNTER — Encounter (HOSPITAL_COMMUNITY): Payer: Self-pay | Admitting: Cardiovascular Disease

## 2011-08-24 ENCOUNTER — Other Ambulatory Visit (INDEPENDENT_AMBULATORY_CARE_PROVIDER_SITE_OTHER): Payer: Medicare Other

## 2011-09-11 DIAGNOSIS — Z7901 Long term (current) use of anticoagulants: Secondary | ICD-10-CM | POA: Diagnosis not present

## 2011-09-11 DIAGNOSIS — I4891 Unspecified atrial fibrillation: Secondary | ICD-10-CM | POA: Diagnosis not present

## 2011-09-11 DIAGNOSIS — I119 Hypertensive heart disease without heart failure: Secondary | ICD-10-CM | POA: Diagnosis not present

## 2011-09-18 DIAGNOSIS — G4730 Sleep apnea, unspecified: Secondary | ICD-10-CM | POA: Diagnosis not present

## 2011-09-18 DIAGNOSIS — G479 Sleep disorder, unspecified: Secondary | ICD-10-CM | POA: Diagnosis not present

## 2011-09-18 DIAGNOSIS — R0609 Other forms of dyspnea: Secondary | ICD-10-CM | POA: Diagnosis not present

## 2011-09-18 DIAGNOSIS — G4733 Obstructive sleep apnea (adult) (pediatric): Secondary | ICD-10-CM | POA: Diagnosis not present

## 2011-09-27 DIAGNOSIS — Z79899 Other long term (current) drug therapy: Secondary | ICD-10-CM | POA: Diagnosis not present

## 2011-09-27 DIAGNOSIS — I10 Essential (primary) hypertension: Secondary | ICD-10-CM | POA: Diagnosis not present

## 2011-10-02 DIAGNOSIS — I10 Essential (primary) hypertension: Secondary | ICD-10-CM | POA: Diagnosis not present

## 2011-10-02 DIAGNOSIS — I4891 Unspecified atrial fibrillation: Secondary | ICD-10-CM | POA: Diagnosis not present

## 2011-10-03 DIAGNOSIS — G4733 Obstructive sleep apnea (adult) (pediatric): Secondary | ICD-10-CM | POA: Diagnosis not present

## 2011-11-10 ENCOUNTER — Other Ambulatory Visit: Payer: Self-pay | Admitting: Cardiovascular Disease

## 2011-11-10 DIAGNOSIS — Z7901 Long term (current) use of anticoagulants: Secondary | ICD-10-CM | POA: Diagnosis not present

## 2011-11-10 DIAGNOSIS — I4891 Unspecified atrial fibrillation: Secondary | ICD-10-CM | POA: Diagnosis not present

## 2011-11-13 ENCOUNTER — Encounter (HOSPITAL_COMMUNITY): Payer: Self-pay | Admitting: Pharmacy Technician

## 2011-11-17 DIAGNOSIS — Z01818 Encounter for other preprocedural examination: Secondary | ICD-10-CM | POA: Diagnosis not present

## 2011-11-17 DIAGNOSIS — I4891 Unspecified atrial fibrillation: Secondary | ICD-10-CM | POA: Diagnosis not present

## 2011-11-17 DIAGNOSIS — Z79899 Other long term (current) drug therapy: Secondary | ICD-10-CM | POA: Diagnosis not present

## 2011-11-17 DIAGNOSIS — D689 Coagulation defect, unspecified: Secondary | ICD-10-CM | POA: Diagnosis not present

## 2011-11-21 ENCOUNTER — Other Ambulatory Visit: Payer: Self-pay | Admitting: Cardiovascular Disease

## 2011-11-22 ENCOUNTER — Ambulatory Visit (HOSPITAL_COMMUNITY): Payer: Medicare Other | Admitting: Anesthesiology

## 2011-11-22 ENCOUNTER — Ambulatory Visit (HOSPITAL_COMMUNITY)
Admission: RE | Admit: 2011-11-22 | Discharge: 2011-11-22 | Disposition: A | Discharge status: Home / Self Care | Payer: Medicare Other | Source: Ambulatory Visit | Attending: Cardiovascular Disease | Admitting: Cardiovascular Disease

## 2011-11-22 ENCOUNTER — Encounter (HOSPITAL_COMMUNITY): Payer: Self-pay | Admitting: Anesthesiology

## 2011-11-22 ENCOUNTER — Encounter (HOSPITAL_COMMUNITY)
Admission: RE | Disposition: A | Discharge status: Home / Self Care | Payer: Self-pay | Source: Ambulatory Visit | Attending: Cardiovascular Disease

## 2011-11-22 DIAGNOSIS — I4891 Unspecified atrial fibrillation: Secondary | ICD-10-CM | POA: Insufficient documentation

## 2011-11-22 HISTORY — PX: CARDIOVERSION: SHX1299

## 2011-11-22 SURGERY — CARDIOVERSION
Anesthesia: General | Wound class: Clean

## 2011-11-22 MED ORDER — LIDOCAINE HCL (CARDIAC) 20 MG/ML IV SOLN
INTRAVENOUS | Status: DC | PRN
  Administered 2011-11-22: 40 mg via INTRAVENOUS

## 2011-11-22 MED ORDER — DEXTROSE-NACL 5-0.45 % IV SOLN
INTRAVENOUS | Status: DC
  Administered 2011-11-22: 13:00:00 via INTRAVENOUS

## 2011-11-22 MED ORDER — DEXTROSE-NACL 5-0.45 % IV SOLN
INTRAVENOUS | Status: DC | PRN
  Administered 2011-11-22: 15:00:00 via INTRAVENOUS

## 2011-11-22 MED ORDER — PROPOFOL 10 MG/ML IV EMUL
INTRAVENOUS | Status: DC | PRN
  Administered 2011-11-22: 100 mg via INTRAVENOUS

## 2011-11-22 NOTE — Discharge Instructions (Signed)
PATIENT INSTRUCTIONS  POST-ANESTHESIA    IMMEDIATELY FOLLOWING SURGERY:  Do not drive or operate machinery for the first twenty four hours after surgery.  Do not make any important decisions for twenty four hours after surgery or while taking narcotic pain medications or sedatives.  If you develop intractable nausea and vomiting or a severe headache please notify your doctor immediately.    FOLLOW-UP:  Please make an appointment with your surgeon as instructed. You do not need to follow up with anesthesia unless specifically instructed to do so.      QUESTIONS?:  Please feel free to call your physician or the hospital operator if you have any questions, and they will be happy to assist you.

## 2011-11-22 NOTE — Transfer of Care (Signed)
Immediate Anesthesia Transfer of Care Note  Patient: Andrew [name]  Procedure(s) Performed: Procedure(s) (LRB): CARDIOVERSION (N/A)  Patient Location: Short Stay  Anesthesia Type: General  Level of Consciousness: oriented, sedated, patient cooperative and responds to stimulation  Airway & Oxygen Therapy: Patient Spontanous Breathing and Patient connected to nasal cannula oxygen  Post-op Assessment: Report given toShort stay Nurse ,Liborio Nixon, RN , Post -op Vital signs reviewed and stable, Patient moving all extremities and Patient moving all extremities X 4  Post vital signs: Reviewed and stable  Complications: No apparent anesthesia complications

## 2011-11-22 NOTE — H&P (Signed)
  Updated H&P:  See dictated office note from 11/10/11. No significant changes.  Pt has been on propafenone as well as BiPAP therapy. He has been on Xarelto.  ECG today shows AF/Flutter in 60's. Plan DC cardioversion. Labs reviewed.

## 2011-11-22 NOTE — Preoperative (Signed)
Beta Blockers   Reason not to administer Beta Blockers:Pt took Metoprolol 25mg  PO @2300hr  on 11/21/2011

## 2011-11-22 NOTE — CV Procedure (Signed)
[facility]  CARDIOVERSION NOTE   Procedure: Electrical Cardioversion Indications:  Atrial Fibrillation  Procedure Details:  Consent: obtained  Time Out: Verified patient identification, verified procedure, site/side was marked, verified correct patient position, special equipment/implants available, medications/allergies/relevent history reviewed, required imaging and test results available.  time out performed  Patient placed on cardiac monitor, pulse oximetry, supplemental oxygen as necessary.  Sedation given: 100 mg propothol per Dr. Gypsy Balsam Pacer pads placed anterior and posterior chest.  Cardioverted 1 time(s).  Cardioverted at 120J biphasic.  Converted to NSR with prolonged SNRT.  Evaluation: Findings: Post procedure EKG shows: Sinus Rhythm at 56 with PR 256 msec. Complications: none Patient did tolerate procedure well.   Lennette Bihari, MD, [facility] 11/22/2011 2:49 PM

## 2011-11-22 NOTE — Anesthesia Preprocedure Evaluation (Signed)
Anesthesia Evaluation  Patient identified by MRN, date of birth, ID band Patient awake    Reviewed: Allergy & Precautions, H&P , NPO status , Patient's Chart, lab work & pertinent test results  Airway Mallampati: I TM Distance: >3 FB Neck ROM: Full    Dental   Pulmonary    Pulmonary exam normal       Cardiovascular hypertension, + dysrhythmias Atrial Fibrillation Rhythm:Irregular Rate:Normal     Neuro/Psych    GI/Hepatic   Endo/Other    Renal/GU      Musculoskeletal   Abdominal Normal abdominal exam  (+)   Peds  Hematology   Anesthesia Other Findings   Reproductive/Obstetrics                           Anesthesia Physical Anesthesia Plan  ASA: III  Anesthesia Plan: General   Post-op Pain Management:    Induction: Intravenous  Airway Management Planned: Mask  Additional Equipment:   Intra-op Plan:   Post-operative Plan: Extubation in OR  Informed Consent: I have reviewed the patients History and Physical, chart, labs and discussed the procedure including the risks, benefits and alternatives for the proposed anesthesia with the patient or authorized representative who has indicated his/her understanding and acceptance.     Plan Discussed with: CRNA and Surgeon  Anesthesia Plan Comments:         Anesthesia Quick Evaluation

## 2011-11-23 NOTE — Anesthesia Postprocedure Evaluation (Signed)
  Anesthesia Post-op Note  Patient: Andrew [name]  Procedure(s) Performed: Procedure(s) (LRB): CARDIOVERSION (N/A)  Patient Location: PACU and Short Stay  Anesthesia Type: General  Level of Consciousness: awake  Airway and Oxygen Therapy: Patient Spontanous Breathing  Post-op Pain: none  Post-op Assessment: Post-op Vital signs reviewed, Patient's Cardiovascular Status Stable and Respiratory Function Stable  Post-op Vital Signs: stable  Complications: No apparent anesthesia complications

## 2011-11-24 ENCOUNTER — Encounter (HOSPITAL_COMMUNITY): Payer: Self-pay | Admitting: Cardiovascular Disease

## 2011-12-06 ENCOUNTER — Telehealth: Payer: Self-pay | Admitting: Gastroenterology

## 2011-12-06 NOTE — Telephone Encounter (Signed)
Pt wants to know if he is due for any Phlebotomy.

## 2011-12-13 ENCOUNTER — Other Ambulatory Visit (INDEPENDENT_AMBULATORY_CARE_PROVIDER_SITE_OTHER): Payer: Medicare Other

## 2011-12-14 DIAGNOSIS — G4733 Obstructive sleep apnea (adult) (pediatric): Secondary | ICD-10-CM | POA: Diagnosis not present

## 2011-12-14 DIAGNOSIS — I4891 Unspecified atrial fibrillation: Secondary | ICD-10-CM | POA: Diagnosis not present

## 2011-12-21 DIAGNOSIS — H40019 Open angle with borderline findings, low risk, unspecified eye: Secondary | ICD-10-CM | POA: Diagnosis not present

## 2011-12-21 DIAGNOSIS — H2589 Other age-related cataract: Secondary | ICD-10-CM | POA: Diagnosis not present

## 2012-01-10 ENCOUNTER — Other Ambulatory Visit: Payer: Self-pay | Admitting: Dermatology

## 2012-01-10 DIAGNOSIS — L820 Inflamed seborrheic keratosis: Secondary | ICD-10-CM | POA: Diagnosis not present

## 2012-01-10 DIAGNOSIS — D485 Neoplasm of uncertain behavior of skin: Secondary | ICD-10-CM | POA: Diagnosis not present

## 2012-01-10 DIAGNOSIS — L821 Other seborrheic keratosis: Secondary | ICD-10-CM | POA: Diagnosis not present

## 2012-01-10 DIAGNOSIS — L570 Actinic keratosis: Secondary | ICD-10-CM | POA: Diagnosis not present

## 2012-02-29 DIAGNOSIS — Z7901 Long term (current) use of anticoagulants: Secondary | ICD-10-CM | POA: Diagnosis not present

## 2012-02-29 DIAGNOSIS — G4731 Primary central sleep apnea: Secondary | ICD-10-CM | POA: Diagnosis not present

## 2012-02-29 DIAGNOSIS — I4891 Unspecified atrial fibrillation: Secondary | ICD-10-CM | POA: Diagnosis not present

## 2012-03-14 ENCOUNTER — Other Ambulatory Visit (INDEPENDENT_AMBULATORY_CARE_PROVIDER_SITE_OTHER): Payer: Medicare Other

## 2012-03-14 ENCOUNTER — Other Ambulatory Visit: Payer: Self-pay

## 2012-03-14 LAB — HEMATOCRIT: HCT: 45.9 % (ref 39.0–52.0)

## 2012-03-29 DIAGNOSIS — Z23 Encounter for immunization: Secondary | ICD-10-CM | POA: Diagnosis not present

## 2012-05-01 DIAGNOSIS — H40019 Open angle with borderline findings, low risk, unspecified eye: Secondary | ICD-10-CM | POA: Diagnosis not present

## 2012-05-07 ENCOUNTER — Encounter: Payer: Self-pay | Admitting: Gastroenterology

## 2012-05-16 ENCOUNTER — Other Ambulatory Visit: Payer: Self-pay | Admitting: Dermatology

## 2012-05-16 DIAGNOSIS — L821 Other seborrheic keratosis: Secondary | ICD-10-CM | POA: Diagnosis not present

## 2012-05-16 DIAGNOSIS — D485 Neoplasm of uncertain behavior of skin: Secondary | ICD-10-CM | POA: Diagnosis not present

## 2012-05-16 DIAGNOSIS — B079 Viral wart, unspecified: Secondary | ICD-10-CM | POA: Diagnosis not present

## 2012-05-16 DIAGNOSIS — L570 Actinic keratosis: Secondary | ICD-10-CM | POA: Diagnosis not present

## 2012-05-20 ENCOUNTER — Other Ambulatory Visit: Payer: Medicare Other

## 2012-06-11 ENCOUNTER — Other Ambulatory Visit: Payer: Self-pay

## 2012-06-11 ENCOUNTER — Other Ambulatory Visit (INDEPENDENT_AMBULATORY_CARE_PROVIDER_SITE_OTHER): Payer: Medicare Other

## 2012-06-11 ENCOUNTER — Encounter: Payer: Self-pay | Admitting: Gastroenterology

## 2012-06-11 ENCOUNTER — Ambulatory Visit (INDEPENDENT_AMBULATORY_CARE_PROVIDER_SITE_OTHER): Payer: Medicare Other | Admitting: Gastroenterology

## 2012-06-11 VITALS — BP 120/82 | HR 66 | Ht 71.0 in | Wt 196.4 lb

## 2012-06-11 DIAGNOSIS — R6889 Other general symptoms and signs: Secondary | ICD-10-CM | POA: Diagnosis not present

## 2012-06-11 DIAGNOSIS — R933 Abnormal findings on diagnostic imaging of other parts of digestive tract: Secondary | ICD-10-CM

## 2012-06-11 DIAGNOSIS — Z7901 Long term (current) use of anticoagulants: Secondary | ICD-10-CM | POA: Diagnosis not present

## 2012-06-11 DIAGNOSIS — Z86010 Personal history of colonic polyps: Secondary | ICD-10-CM | POA: Diagnosis not present

## 2012-06-11 DIAGNOSIS — R198 Other specified symptoms and signs involving the digestive system and abdomen: Secondary | ICD-10-CM

## 2012-06-11 LAB — CBC WITH DIFFERENTIAL/PLATELET
Eosinophils Relative: 1.2 % (ref 0.0–5.0)
HCT: 45.2 % (ref 39.0–52.0)
Hemoglobin: 15.5 g/dL (ref 13.0–17.0)
Lymphs Abs: 1.3 10*3/uL (ref 0.7–4.0)
MCV: 93.2 fl (ref 78.0–100.0)
Monocytes Absolute: 0.7 10*3/uL (ref 0.1–1.0)
Monocytes Relative: 10.4 % (ref 3.0–12.0)
Neutro Abs: 4.3 10*3/uL (ref 1.4–7.7)
WBC: 6.3 10*3/uL (ref 4.5–10.5)

## 2012-06-11 LAB — FOLATE: Folate: 21.9 ng/mL (ref >5.9)

## 2012-06-11 LAB — IBC PANEL: Saturation Ratios: 88.3 % — ABNORMAL HIGH (ref 20.0–50.0)

## 2012-06-11 LAB — VITAMIN B12: Vitamin B-12: 351 pg/mL (ref 211–911)

## 2012-06-11 MED ORDER — PEG-KCL-NACL-NASULF-NA ASC-C 100 G PO SOLR: 1.0000 | Freq: Once | ORAL | Status: DC

## 2012-06-11 NOTE — Progress Notes (Signed)
History of Present Illness: This is a 79 year old male with a history of hemochromatosis returning for followup. He has been on every 3 month phlebotomies. He notes a change in bowel habits several months ago with looser stools. The symptoms started around that time he began Rhythmol. He is due for surveillance colonoscopy for history of colon polyps. He is maintained on Xarelto for afib. Denies weight loss, abdominal pain, constipation, change in stool caliber, melena, hematochezia, nausea, vomiting, dysphagia, reflux symptoms, chest pain.  Current Medications, Allergies, Past Medical History, Past Surgical History, Family History and Social History were reviewed in [employer] record.  Physical Exam: General: Well developed , well nourished, no acute distress Head: Normocephalic and atraumatic Eyes:  sclerae anicteric, EOMI Ears: Normal auditory acuity Mouth: No deformity or lesions Lungs: Clear throughout to auscultation Heart: Regular rate and rhythm; no murmurs, rubs or bruits Abdomen: Soft, non tender and non distended. No masses, hepatosplenomegaly or hernias noted. Normal Bowel sounds Rectal: deferred to colonoscopy Musculoskeletal: Symmetrical with no gross deformities  Pulses:  Normal pulses noted Extremities: No clubbing, cyanosis, edema or deformities noted Neurological: Alert oriented x 4, grossly nonfocal Psychological:  Alert and cooperative. Normal mood and affect  Assessment and Recommendations:  1. Hereditary hemochromatosis. Obtain CBC, iron studies and ferritin. Continue phlebotomies and adjust frequency based on his iron stores.  2. Change in bowel habits with looser stools. Advised him to begin a trial of lactose avoidance and if this is not successful begin daily probiotic. Schedule colonoscopy.  3. Personal history of adenomatous colon polyps. The risks, benefits, and alternatives to colonoscopy with possible biopsy and possible polypectomy  were discussed with the patient and they consent to proceed. The risks, benefits and alternatives to a short hold of Xarelto were discussed with the patient and he consents to proceed. Will obtain clearance from his cardiologist.

## 2012-06-11 NOTE — Patient Instructions (Addendum)
You have been scheduled for a colonoscopy with propofol. Please follow written instructions given to you at your visit today.  Please pick up your prep kit at the pharmacy within the next 1-3 days. If you use inhalers (even only as needed) or a CPAP machine, please bring them with you on the day of your procedure.  You will be contaced by our office prior to your procedure for directions on holding your Xarelto.  If you do not hear from our office 1 week prior to your scheduled procedure, please call [phone] to discuss.  Your physician has requested that you go to the basement for the following lab work before leaving today: CBC, Iron studies, and Ferritin.  You are scheduled to have your Phlebotomy tomorrow in the lab.   cc: Merri Brunette, MD

## 2012-06-13 ENCOUNTER — Telehealth: Payer: Self-pay

## 2012-06-13 NOTE — Telephone Encounter (Signed)
Received fax from Dr. Tresa Endo stating patient can stop Xarelto 2 days prior to procedure scheduled. Informed patient on telephone and pt verbalized understanding.

## 2012-06-27 ENCOUNTER — Ambulatory Visit (AMBULATORY_SURGERY_CENTER): Payer: Medicare Other | Admitting: Gastroenterology

## 2012-06-27 ENCOUNTER — Encounter: Payer: Self-pay | Admitting: Gastroenterology

## 2012-06-27 VITALS — BP 145/83 | HR 55 | Temp 95.9°F | Resp 25 | Ht 71.0 in | Wt 196.0 lb

## 2012-06-27 DIAGNOSIS — K5730 Diverticulosis of large intestine without perforation or abscess without bleeding: Secondary | ICD-10-CM

## 2012-06-27 DIAGNOSIS — Z1211 Encounter for screening for malignant neoplasm of colon: Secondary | ICD-10-CM

## 2012-06-27 DIAGNOSIS — I10 Essential (primary) hypertension: Secondary | ICD-10-CM | POA: Diagnosis not present

## 2012-06-27 DIAGNOSIS — G4733 Obstructive sleep apnea (adult) (pediatric): Secondary | ICD-10-CM | POA: Diagnosis not present

## 2012-06-27 DIAGNOSIS — D126 Benign neoplasm of colon, unspecified: Secondary | ICD-10-CM

## 2012-06-27 DIAGNOSIS — Z86010 Personal history of colonic polyps: Secondary | ICD-10-CM

## 2012-06-27 DIAGNOSIS — F959 Tic disorder, unspecified: Secondary | ICD-10-CM | POA: Diagnosis not present

## 2012-06-27 MED ORDER — SODIUM CHLORIDE 0.9 % IV SOLN: 500.0000 mL | INTRAVENOUS | Status: DC

## 2012-06-27 NOTE — Progress Notes (Signed)
Lidocaine-40mg IV prior to Propofol InductionPropofol given over incremental dosages 

## 2012-06-27 NOTE — Patient Instructions (Addendum)

## 2012-06-27 NOTE — Progress Notes (Signed)
Called to room to assist during endoscopic procedure.  Patient ID and intended procedure confirmed with present staff. Received instructions for my participation in the procedure from the performing physician.  

## 2012-06-27 NOTE — Op Note (Signed)
[facility] [address] [place], [address]   COLONOSCOPY PROCEDURE REPORT PATIENT: Andrew [name], Andrew [name]  MR#: [id number] BIRTHDATE: 1930/07/22 , 81  yrs. old GENDER: Male ENDOSCOPIST: Meryl Dare, MD, [facility] PROCEDURE DATE:  06/27/2012 PROCEDURE:   Colonoscopy with snare polypectomy and Colonoscopy with biopsy ASA CLASS:   Class III INDICATIONS:Patient's personal history of adenomatous colon polyps.  MEDICATIONS: MAC sedation, administered by CRNA and propofol (Diprivan) 150mg  IV DESCRIPTION OF PROCEDURE:   After the risks benefits and alternatives of the procedure were thoroughly explained, informed consent was obtained.  A digital rectal exam revealed no abnormalities of the rectum.   The LB [id number]  endoscope was introduced through the anus and advanced to the cecum, which was identified by both the appendix and ileocecal valve. No adverse events experienced.   The quality of the prep was good, using MoviPrep  The instrument was then slowly withdrawn as the colon was fully examined.  COLON FINDINGS: A sessile polyp measuring 3 mm in size was found at the cecum.  A polypectomy was performed with cold forceps.  The resection was complete and the polyp tissue was completely retrieved.   A single polyp measuring 6 mm in size was found in the descending colon and it was associated with a diverticulum. Polypectomy was performed with a cold snare.  The resection was complete and the polyp tissue was completely retrieved.   Moderate diverticulosis was noted in the descending colon and sigmoid colon. The colon was otherwise normal.  There was no diverticulosis, inflammation, polyps or cancers unless previously stated. Retroflexed views revealed moderate internal hemorrhoids. The time to cecum=2 minutes 17 seconds.  Withdrawal time=12 minutes 10 seconds.  The scope was withdrawn and the procedure completed. COMPLICATIONS: There were no  complications.  ENDOSCOPIC IMPRESSION: 1.   Sessile polyp measuring 3 mm at the cecum; polypectomy performed with cold forceps 2.   Single polyp measuring 6 mm in the descending colon; polypectomy performed with a cold snare 3.   Moderate diverticulosis was noted in the descending colon and sigmoid colon 4.   Moderate internal hemorrhoids  RECOMMENDATIONS: 1.  Await pathology results 2.  High fiber diet with liberal fluid intake. 3.  Given your age, you will not need another colonoscopy for colon cancer screening or polyp surveillance.  These types of tests usually stop around the age 89.  eSigned:  Meryl Dare, MD, [facility] 06/27/2012 10:36 AM   cc: Romero Liner, MD

## 2012-06-27 NOTE — Progress Notes (Signed)
Patient did not experience any of the following events: a burn prior to discharge; a fall within the facility; wrong site/side/patient/procedure/implant event; or a hospital transfer or hospital admission upon discharge from the facility. (G8907) Patient did not have preoperative order for IV antibiotic SSI prophylaxis. (G8918)  

## 2012-06-28 ENCOUNTER — Telehealth: Payer: Self-pay | Admitting: *Deleted

## 2012-06-28 NOTE — Telephone Encounter (Signed)
  Follow up Call-  Call back number 06/27/2012  Post procedure Call Back phone  # [phone]  Permission to leave phone message Yes     Patient questions:  Do you have a fever, pain , or abdominal swelling? no Pain Score  0 *  Have you tolerated food without any problems? yes  Have you been able to return to your normal activities? yes  Do you have any questions about your discharge instructions: Diet   no Medications  no Follow up visit  no  Do you have questions or concerns about your Care? no  Actions: * If pain score is 4 or above: No action needed, pain <4.

## 2012-07-02 ENCOUNTER — Encounter: Payer: Self-pay | Admitting: Gastroenterology

## 2012-07-12 ENCOUNTER — Other Ambulatory Visit: Payer: Self-pay | Admitting: Dermatology

## 2012-07-12 DIAGNOSIS — Z125 Encounter for screening for malignant neoplasm of prostate: Secondary | ICD-10-CM | POA: Diagnosis not present

## 2012-07-12 DIAGNOSIS — I10 Essential (primary) hypertension: Secondary | ICD-10-CM | POA: Diagnosis not present

## 2012-07-12 DIAGNOSIS — D1801 Hemangioma of skin and subcutaneous tissue: Secondary | ICD-10-CM | POA: Diagnosis not present

## 2012-07-12 DIAGNOSIS — D239 Other benign neoplasm of skin, unspecified: Secondary | ICD-10-CM | POA: Diagnosis not present

## 2012-07-12 DIAGNOSIS — D485 Neoplasm of uncertain behavior of skin: Secondary | ICD-10-CM | POA: Diagnosis not present

## 2012-07-12 DIAGNOSIS — Z79899 Other long term (current) drug therapy: Secondary | ICD-10-CM | POA: Diagnosis not present

## 2012-07-12 DIAGNOSIS — L719 Rosacea, unspecified: Secondary | ICD-10-CM | POA: Diagnosis not present

## 2012-07-12 DIAGNOSIS — R350 Frequency of micturition: Secondary | ICD-10-CM | POA: Diagnosis not present

## 2012-07-12 DIAGNOSIS — Z0000 Encounter for general adult medical examination without abnormal findings: Secondary | ICD-10-CM | POA: Diagnosis not present

## 2012-07-12 DIAGNOSIS — L821 Other seborrheic keratosis: Secondary | ICD-10-CM | POA: Diagnosis not present

## 2012-07-12 DIAGNOSIS — D044 Carcinoma in situ of skin of scalp and neck: Secondary | ICD-10-CM | POA: Diagnosis not present

## 2012-07-12 DIAGNOSIS — Z85828 Personal history of other malignant neoplasm of skin: Secondary | ICD-10-CM | POA: Diagnosis not present

## 2012-07-16 DIAGNOSIS — I10 Essential (primary) hypertension: Secondary | ICD-10-CM | POA: Diagnosis not present

## 2012-07-16 DIAGNOSIS — Z86010 Personal history of colonic polyps: Secondary | ICD-10-CM | POA: Diagnosis not present

## 2012-07-16 DIAGNOSIS — D45 Polycythemia vera: Secondary | ICD-10-CM | POA: Diagnosis not present

## 2012-07-16 DIAGNOSIS — I4891 Unspecified atrial fibrillation: Secondary | ICD-10-CM | POA: Diagnosis not present

## 2012-07-22 DIAGNOSIS — H2510 Age-related nuclear cataract, unspecified eye: Secondary | ICD-10-CM | POA: Diagnosis not present

## 2012-07-22 DIAGNOSIS — H40019 Open angle with borderline findings, low risk, unspecified eye: Secondary | ICD-10-CM | POA: Diagnosis not present

## 2012-07-22 DIAGNOSIS — H02839 Dermatochalasis of unspecified eye, unspecified eyelid: Secondary | ICD-10-CM | POA: Diagnosis not present

## 2012-07-23 ENCOUNTER — Other Ambulatory Visit (INDEPENDENT_AMBULATORY_CARE_PROVIDER_SITE_OTHER): Payer: Medicare Other

## 2012-07-23 LAB — HEMOGLOBIN: Hemoglobin: 15.4 g/dL (ref 13.0–17.0)

## 2012-08-15 DIAGNOSIS — I119 Hypertensive heart disease without heart failure: Secondary | ICD-10-CM | POA: Diagnosis not present

## 2012-08-15 DIAGNOSIS — G4733 Obstructive sleep apnea (adult) (pediatric): Secondary | ICD-10-CM | POA: Diagnosis not present

## 2012-08-15 DIAGNOSIS — Z7901 Long term (current) use of anticoagulants: Secondary | ICD-10-CM | POA: Diagnosis not present

## 2012-08-15 DIAGNOSIS — I4891 Unspecified atrial fibrillation: Secondary | ICD-10-CM | POA: Diagnosis not present

## 2012-08-29 ENCOUNTER — Other Ambulatory Visit (INDEPENDENT_AMBULATORY_CARE_PROVIDER_SITE_OTHER): Payer: Medicare Other

## 2012-09-26 ENCOUNTER — Other Ambulatory Visit (INDEPENDENT_AMBULATORY_CARE_PROVIDER_SITE_OTHER): Payer: Medicare Other

## 2012-10-30 ENCOUNTER — Other Ambulatory Visit (INDEPENDENT_AMBULATORY_CARE_PROVIDER_SITE_OTHER): Payer: Medicare Other

## 2012-11-13 ENCOUNTER — Other Ambulatory Visit: Payer: Self-pay | Admitting: Dermatology

## 2012-11-13 DIAGNOSIS — D485 Neoplasm of uncertain behavior of skin: Secondary | ICD-10-CM | POA: Diagnosis not present

## 2012-11-13 DIAGNOSIS — D1801 Hemangioma of skin and subcutaneous tissue: Secondary | ICD-10-CM | POA: Diagnosis not present

## 2012-11-13 DIAGNOSIS — D2370 Other benign neoplasm of skin of unspecified lower limb, including hip: Secondary | ICD-10-CM | POA: Diagnosis not present

## 2012-11-13 DIAGNOSIS — Z85828 Personal history of other malignant neoplasm of skin: Secondary | ICD-10-CM | POA: Diagnosis not present

## 2012-11-13 DIAGNOSIS — D692 Other nonthrombocytopenic purpura: Secondary | ICD-10-CM | POA: Diagnosis not present

## 2012-11-13 DIAGNOSIS — L821 Other seborrheic keratosis: Secondary | ICD-10-CM | POA: Diagnosis not present

## 2012-11-13 DIAGNOSIS — L820 Inflamed seborrheic keratosis: Secondary | ICD-10-CM | POA: Diagnosis not present

## 2012-11-13 DIAGNOSIS — L570 Actinic keratosis: Secondary | ICD-10-CM | POA: Diagnosis not present

## 2012-11-13 DIAGNOSIS — D239 Other benign neoplasm of skin, unspecified: Secondary | ICD-10-CM | POA: Diagnosis not present

## 2012-12-10 ENCOUNTER — Other Ambulatory Visit (INDEPENDENT_AMBULATORY_CARE_PROVIDER_SITE_OTHER): Payer: Medicare Other

## 2012-12-10 LAB — HEMOGLOBIN: Hemoglobin: 15.3 g/dL (ref 13.0–17.0)

## 2012-12-10 LAB — HEMATOCRIT: HCT: 44.4 % (ref 39.0–52.0)

## 2012-12-25 DIAGNOSIS — H2589 Other age-related cataract: Secondary | ICD-10-CM | POA: Diagnosis not present

## 2013-01-23 ENCOUNTER — Other Ambulatory Visit (INDEPENDENT_AMBULATORY_CARE_PROVIDER_SITE_OTHER): Payer: Medicare Other

## 2013-01-23 ENCOUNTER — Other Ambulatory Visit: Payer: Self-pay

## 2013-01-23 LAB — CBC WITH DIFFERENTIAL/PLATELET
Basophils Absolute: 0 10*3/uL (ref 0.0–0.1)
Lymphocytes Relative: 20.3 % (ref 12.0–46.0)
Monocytes Relative: 9.6 % (ref 3.0–12.0)
Neutrophils Relative %: 66.6 % (ref 43.0–77.0)
Platelets: 188 10*3/uL (ref 150.0–400.0)
RDW: 12.6 % (ref 11.5–14.6)

## 2013-01-23 LAB — FERRITIN: Ferritin: 19.8 ng/mL — ABNORMAL LOW (ref 22.0–322.0)

## 2013-01-23 LAB — IBC PANEL
Iron: 159 ug/dL (ref 42–165)
Transferrin: 230.8 mg/dL (ref 212.0–360.0)

## 2013-01-24 ENCOUNTER — Other Ambulatory Visit: Payer: Self-pay

## 2013-01-24 ENCOUNTER — Other Ambulatory Visit: Payer: Medicare Other

## 2013-02-11 ENCOUNTER — Ambulatory Visit (INDEPENDENT_AMBULATORY_CARE_PROVIDER_SITE_OTHER): Payer: Medicare Other | Admitting: Cardiovascular Disease

## 2013-02-11 ENCOUNTER — Other Ambulatory Visit: Payer: Self-pay | Admitting: Cardiovascular Disease

## 2013-02-11 ENCOUNTER — Encounter: Payer: Self-pay | Admitting: Cardiovascular Disease

## 2013-02-11 VITALS — BP 112/68 | HR 51 | Ht 71.0 in | Wt 194.7 lb

## 2013-02-11 DIAGNOSIS — I480 Paroxysmal atrial fibrillation: Secondary | ICD-10-CM

## 2013-02-11 DIAGNOSIS — G4733 Obstructive sleep apnea (adult) (pediatric): Secondary | ICD-10-CM | POA: Diagnosis not present

## 2013-02-11 DIAGNOSIS — I4891 Unspecified atrial fibrillation: Secondary | ICD-10-CM

## 2013-02-11 DIAGNOSIS — Z23 Encounter for immunization: Secondary | ICD-10-CM | POA: Diagnosis not present

## 2013-02-11 DIAGNOSIS — I10 Essential (primary) hypertension: Secondary | ICD-10-CM

## 2013-02-11 NOTE — Telephone Encounter (Signed)
Rx was sent to pharmacy electronically. 

## 2013-02-11 NOTE — Patient Instructions (Addendum)
Your physician recommends that you schedule a follow-up appointment in: 6 months  

## 2013-03-02 ENCOUNTER — Encounter: Payer: Self-pay | Admitting: Cardiovascular Disease

## 2013-03-02 DIAGNOSIS — G4733 Obstructive sleep apnea (adult) (pediatric): Secondary | ICD-10-CM | POA: Insufficient documentation

## 2013-03-02 DIAGNOSIS — I480 Paroxysmal atrial fibrillation: Secondary | ICD-10-CM | POA: Insufficient documentation

## 2013-03-02 NOTE — Progress Notes (Signed)
Patient ID: Andrew [name], male   DOB: 1930/11/17, 79 y.o.   MRN: [id number]     HPI: Andrew [name], is a 79 y.o. male who presents for six-month cardiology evaluation.  Andrew [name] is an 79 year old gentleman who has a history of paroxysmal atrial fibrillation. He was found to be in atrial fibrillation initially in [place] 2013 at which time he was placed on S[place] and beta blocker therapy. He developed recurrent atrial fibrillation after his initial cardioversion. He was found to have obstructive sleep apnea and prior to a second cardioversion CPAP was instituted. He certainly has been on BiPAP therapy. His second cardioversion June 2013 was successful and with treatment of his sleep apnea and propefenone he is maintaining sinus rhythm. His MDE company is [employer].  He does have a history of hemachromatosis and has phlebotomies monthly. He denies chest pain. He denies palpitations. He has a history of hypertension  Past Medical History  Diagnosis Date  . Hepatitis B antibody positive   . Hypertension   . Diverticulosis   . Adenomatous colon polyp 10/1993  . Hemochromatosis     Compound Heterozygous for C282Y & H63D mutations  . Atrial fibrillation     Past Surgical History  Procedure Laterality Date  . Cardioversion  08/18/2011    Procedure: CARDIOVERSION;  Surgeon: Andrew Bihari, MD;  Location: [facility] OR;  Service: Cardiovascular;  Laterality: N/A;  . Cardioversion  11/22/2011    Procedure: CARDIOVERSION;  Surgeon: Andrew Bihari, MD;  Location: [facility] OR;  Service: Cardiovascular;  Laterality: N/A;  . Tonsillectomy    . Laparoscopic cholecystectomy  04/2009  . Knee arthroscopy    . Colonoscopy      No Known Allergies  Current Outpatient Prescriptions  Medication Sig Dispense Refill  . amLODipine (NORVASC) 5 MG tablet Take 5 mg by mouth daily.      . fish oil-omega-3 fatty acids 1000 MG capsule Take 1 g by mouth daily.      [name] Kitchen lisinopril (PRINIVIL,ZESTRIL) 40 MG tablet  Take 40 mg by mouth daily.      . metoprolol succinate (TOPROL-XL) 25 MG 24 hr tablet Take 25 mg by mouth daily.      . Rivaroxaban (XARELTO) 20 MG TABS Take 1 tablet by mouth daily.      . Tamsulosin HCl (FLOMAX) 0.4 MG CAPS Take 0.4 mg by mouth daily.      . propafenone (RYTHMOL) 150 MG tablet TAKE ONE TABLET BY MOUTH EVERY EIGHT HOURS  90 tablet  5   No current facility-administered medications for this visit.    History   Social History  . Marital Status: Married    Spouse Name: N/A    Number of Children: N/A  . Years of Education: N/A   Occupational History  . Retired    Social History Main Topics  . Smoking status: Former Games developer  . Smokeless tobacco: Never Used  . Alcohol Use: Yes     Comment: 2 glasses if wine daily  . Drug Use: No  . Sexual Activity: Not on file   Other Topics Concern  . Not on file   Social History Narrative  . No narrative on file    Family History  Problem Relation Age of Onset  . Esophageal cancer Father    Socially, he is retired. He is the owner of [employer]. He is a,married and has 3 children and 9 grandchildren. He does exercise. No tobacco use. He does drink occasional  alcohol.   ROS is negative for fevers, chills or night sweats. He denies recurrent tachycardia palpitations. He denies chest pain. He uses his CPAP 100% of nights. He is unaware of breakthrough snoring. He denies daytime sleepiness. He denies abdominal pain presently. He gets monthly phlebotomies. There is no edema.   Other system review is negative.  PE BP 112/68  Pulse 51  Ht 5\' 11"  (1.803 m)  Wt 194 lb 11.2 oz (88.315 kg)  BMI 27.17 kg/m2  General: Alert, oriented, no distress.  Skin: normal turgor, no rashes HEENT: Normocephalic, atraumatic. Pupils round and reactive; sclera anicteric;no lid lag.  Nose without nasal septal hypertrophy Mouth/Parynx benign; Mallinpatti scale 3 Neck: No JVD, no carotid briuts Lungs: clear to ausculatation and percussion;  no wheezing or rales Heart: RRR, s1 s2 normal 1/6 systolic murmur Abdomen: soft, nontender; no hepatosplenomehaly, BS+; abdominal aorta nontender and not dilated by palpation. Pulses 2+ Extremities: no clubbing cyanosis or edema, Homan's sign negative  Neurologic: grossly nonfocal  ECG: Sinus bradycardia at 51 per minute. First degree AV [name]; QTc interval 458 ms  LABS:  BMET    Component Value Date/Time   NA 139 04/22/2009 0830   K 4.1 04/22/2009 0830   CL 101 04/22/2009 0830   CO2 31 04/22/2009 0830   GLUCOSE 111* 04/22/2009 0830   BUN 9 04/22/2009 0830   CREATININE 0.78 04/22/2009 0830   CALCIUM 9.5 04/22/2009 0830   GFRNONAA >60 04/22/2009 0830   GFRAA  Value: >60        The eGFR has been calculated using the MDRD equation. This calculation has not been validated in all clinical situations. eGFR's persistently <60 mL/min signify possible Chronic Kidney Disease. 04/22/2009 0830     Hepatic Function Panel     Component Value Date/Time   PROT 7.0 07/07/2009 1448   ALBUMIN 4.0 07/07/2009 1448   AST 37 07/07/2009 1448   ALT 36 07/07/2009 1448   ALKPHOS 68 07/07/2009 1448   BILITOT 0.7 07/07/2009 1448   BILIDIR 0.2 07/07/2009 1448     CBC    Component Value Date/Time   WBC 7.6 01/23/2013 1457   RBC 4.73 01/23/2013 1457   HGB 15.0 01/23/2013 1457   HCT 42.9 01/23/2013 1457   PLT 188.0 01/23/2013 1457   MCV 90.9 01/23/2013 1457   MCHC 34.9 01/23/2013 1457   RDW 12.6 01/23/2013 1457   LYMPHSABS 1.5 01/23/2013 1457   MONOABS 0.7 01/23/2013 1457   EOSABS 0.2 01/23/2013 1457   BASOSABS 0.0 01/23/2013 1457     BNP No results found for this basename: probnp    Lipid Panel  No results found for this basename: chol, trig, hdl, cholhdl, vldl, ldlcalc     RADIOLOGY: No results found.    ASSESSMENT AND PLAN:  Andrew [name] is maintaining sinus rhythm. He is asymptomatic. He is bradycardic but seems to feel well on his current therapy. He is using his CPAP with 100% compliance. He  denies any chest pain. He's not short of breath. His blood pressure today is controlled on lisinopril 40 mg, Toprol-XL 25 mg in addition to his amlodipine 5 mg daily. He is uncertain although at 20 mg for anticoagulation. There is no bleeding. He has not had phlebotomy for his hemochromatosis. Stable, I will see him in 6 months for cardiology reevaluation.    Andrew Bihari, MD, [facility]  03/02/2013 9:09 PM

## 2013-03-04 ENCOUNTER — Other Ambulatory Visit: Payer: Self-pay | Admitting: Gastroenterology

## 2013-03-04 ENCOUNTER — Other Ambulatory Visit (INDEPENDENT_AMBULATORY_CARE_PROVIDER_SITE_OTHER): Payer: Medicare Other

## 2013-03-04 LAB — CBC WITH DIFFERENTIAL/PLATELET
Basophils Absolute: 0 10*3/uL (ref 0.0–0.1)
Basophils Relative: 0.5 % (ref 0.0–3.0)
Eosinophils Absolute: 0.3 10*3/uL (ref 0.0–0.7)
Lymphs Abs: 1.8 10*3/uL (ref 0.7–4.0)
MCHC: 34.4 g/dL (ref 30.0–36.0)
MCV: 90.9 fl (ref 78.0–100.0)
Monocytes Absolute: 0.8 10*3/uL (ref 0.1–1.0)
Neutrophils Relative %: 63.7 % (ref 43.0–77.0)
Platelets: 191 10*3/uL (ref 150.0–400.0)
RBC: 4.59 Mil/uL (ref 4.22–5.81)
RDW: 12.8 % (ref 11.5–14.6)

## 2013-03-17 ENCOUNTER — Other Ambulatory Visit: Payer: Self-pay | Admitting: Cardiovascular Disease

## 2013-04-04 ENCOUNTER — Other Ambulatory Visit (INDEPENDENT_AMBULATORY_CARE_PROVIDER_SITE_OTHER): Payer: Medicare Other

## 2013-04-04 LAB — CBC WITH DIFFERENTIAL/PLATELET
Basophils Absolute: 0 10*3/uL (ref 0.0–0.1)
Basophils Relative: 0.4 % (ref 0.0–3.0)
Eosinophils Absolute: 0.1 10*3/uL (ref 0.0–0.7)
Lymphocytes Relative: 23.5 % (ref 12.0–46.0)
Lymphs Abs: 1.5 10*3/uL (ref 0.7–4.0)
MCHC: 34.2 g/dL (ref 30.0–36.0)
Monocytes Relative: 9.4 % (ref 3.0–12.0)
Neutrophils Relative %: 65.1 % (ref 43.0–77.0)
RBC: 4.67 Mil/uL (ref 4.22–5.81)
WBC: 6.2 10*3/uL (ref 4.5–10.5)

## 2013-04-08 ENCOUNTER — Other Ambulatory Visit: Payer: Self-pay

## 2013-06-17 ENCOUNTER — Other Ambulatory Visit: Payer: Medicare Other

## 2013-06-20 DIAGNOSIS — N138 Other obstructive and reflux uropathy: Secondary | ICD-10-CM | POA: Diagnosis not present

## 2013-06-20 DIAGNOSIS — N139 Obstructive and reflux uropathy, unspecified: Secondary | ICD-10-CM | POA: Diagnosis not present

## 2013-06-20 DIAGNOSIS — R972 Elevated prostate specific antigen [PSA]: Secondary | ICD-10-CM | POA: Diagnosis not present

## 2013-06-20 DIAGNOSIS — N401 Enlarged prostate with lower urinary tract symptoms: Secondary | ICD-10-CM | POA: Diagnosis not present

## 2013-06-25 ENCOUNTER — Other Ambulatory Visit: Payer: Self-pay

## 2013-06-25 ENCOUNTER — Other Ambulatory Visit (INDEPENDENT_AMBULATORY_CARE_PROVIDER_SITE_OTHER): Payer: Medicare Other

## 2013-06-25 LAB — CBC WITH DIFFERENTIAL/PLATELET
BASOS ABS: 0 10*3/uL (ref 0.0–0.1)
Basophils Relative: 0.3 % (ref 0.0–3.0)
EOS PCT: 2.9 % (ref 0.0–5.0)
Eosinophils Absolute: 0.2 10*3/uL (ref 0.0–0.7)
HCT: 45.3 % (ref 39.0–52.0)
Hemoglobin: 15.7 g/dL (ref 13.0–17.0)
Lymphocytes Relative: 20.3 % (ref 12.0–46.0)
Lymphs Abs: 1.5 10*3/uL (ref 0.7–4.0)
MCHC: 34.7 g/dL (ref 30.0–36.0)
MCV: 89 fl (ref 78.0–100.0)
MONO ABS: 0.8 10*3/uL (ref 0.1–1.0)
Monocytes Relative: 11.3 % (ref 3.0–12.0)
NEUTROS PCT: 65.2 % (ref 43.0–77.0)
Neutro Abs: 4.7 10*3/uL (ref 1.4–7.7)
PLATELETS: 181 10*3/uL (ref 150.0–400.0)
RBC: 5.09 Mil/uL (ref 4.22–5.81)
RDW: 13.2 % (ref 11.5–14.6)
WBC: 7.1 10*3/uL (ref 4.5–10.5)

## 2013-06-25 LAB — IBC PANEL
Iron: 158 ug/dL (ref 42–165)
Saturation Ratios: 49.4 % (ref 20.0–50.0)
Transferrin: 228.6 mg/dL (ref 212.0–360.0)

## 2013-06-25 LAB — FERRITIN: FERRITIN: 32.8 ng/mL (ref 22.0–322.0)

## 2013-06-26 NOTE — Progress Notes (Signed)
Quick Note:    For your review.  ______

## 2013-07-17 DIAGNOSIS — H40019 Open angle with borderline findings, low risk, unspecified eye: Secondary | ICD-10-CM | POA: Diagnosis not present

## 2013-07-24 DIAGNOSIS — I10 Essential (primary) hypertension: Secondary | ICD-10-CM | POA: Diagnosis not present

## 2013-07-24 DIAGNOSIS — Z125 Encounter for screening for malignant neoplasm of prostate: Secondary | ICD-10-CM | POA: Diagnosis not present

## 2013-07-24 DIAGNOSIS — Z79899 Other long term (current) drug therapy: Secondary | ICD-10-CM | POA: Diagnosis not present

## 2013-07-24 DIAGNOSIS — E785 Hyperlipidemia, unspecified: Secondary | ICD-10-CM | POA: Diagnosis not present

## 2013-07-28 DIAGNOSIS — Z0000 Encounter for general adult medical examination without abnormal findings: Secondary | ICD-10-CM | POA: Diagnosis not present

## 2013-08-11 DIAGNOSIS — I4891 Unspecified atrial fibrillation: Secondary | ICD-10-CM | POA: Diagnosis not present

## 2013-08-11 DIAGNOSIS — I10 Essential (primary) hypertension: Secondary | ICD-10-CM | POA: Diagnosis not present

## 2013-08-11 DIAGNOSIS — D45 Polycythemia vera: Secondary | ICD-10-CM | POA: Diagnosis not present

## 2013-08-11 DIAGNOSIS — Z86010 Personal history of colonic polyps: Secondary | ICD-10-CM | POA: Diagnosis not present

## 2013-08-11 DIAGNOSIS — Z23 Encounter for immunization: Secondary | ICD-10-CM | POA: Diagnosis not present

## 2013-08-13 ENCOUNTER — Ambulatory Visit (INDEPENDENT_AMBULATORY_CARE_PROVIDER_SITE_OTHER): Payer: Medicare Other | Admitting: Cardiovascular Disease

## 2013-08-13 ENCOUNTER — Encounter: Payer: Self-pay | Admitting: Cardiovascular Disease

## 2013-08-13 VITALS — BP 142/70 | HR 57 | Ht 71.0 in | Wt 194.7 lb

## 2013-08-13 DIAGNOSIS — Z9989 Dependence on other enabling machines and devices: Secondary | ICD-10-CM

## 2013-08-13 DIAGNOSIS — G4733 Obstructive sleep apnea (adult) (pediatric): Secondary | ICD-10-CM | POA: Diagnosis not present

## 2013-08-13 DIAGNOSIS — I10 Essential (primary) hypertension: Secondary | ICD-10-CM | POA: Diagnosis not present

## 2013-08-13 DIAGNOSIS — I4891 Unspecified atrial fibrillation: Secondary | ICD-10-CM

## 2013-08-13 DIAGNOSIS — Z7901 Long term (current) use of anticoagulants: Secondary | ICD-10-CM | POA: Diagnosis not present

## 2013-08-13 DIAGNOSIS — I480 Paroxysmal atrial fibrillation: Secondary | ICD-10-CM

## 2013-08-13 NOTE — Patient Instructions (Signed)
Your physician recommends that you schedule a follow-up appointment in: 6 months  

## 2013-08-13 NOTE — Progress Notes (Signed)
Patient ID: Andrew [name], male   DOB: 25-May-1931, 80 y.o.   MRN: [id number]      HPI: Andrew [name] is a 80 y.o. male who presents for six-month cardiology evaluation.  Andrew [name] has a history of paroxysmal atrial fibrillation. He was found to be in atrial fibrillation initially in [place] 2013 at which time he was placed on Xarelto and beta blocker therapy. He developed recurrent atrial fibrillation after his initial cardioversion. He was found to have obstructive sleep apnea and prior to a second cardioversion CPAP/BiPAP was instituted. He has been on BiPAP therapy ever since. His second cardioversion June 2013 was successful and with treatment of his sleep apnea and propefenone he has maintained sinus rhythm. His MDE company is [employer]. Over the past 6 months, he is unaware of any breakthrough atrial fibrillation or tachycardic spells. He uses his BiPAP with 100% compliance. Typically he goes to bed approximately 8 PM and wakes up between 5:30 and 6 AM. His sleep is restorative. He is unaware breakthrough snoring. There is no residual daytime sleepiness.  He does have a history of hemachromatosis and has phlebotomies monthly. He denies chest pain. He denies palpitations. He has a history of hypertension. He saw Andrew [name] recently. EKG from Andrew [name] office did show sinus bradycardia with first-degree V. block with a heart rate of 53 beats per minute. He also had laboratory of Andrew [name] office. I reviewed the laboratory results which show a normal CBC and chemistry profile. Total cholesterol 165 triglycerides 119 HDL 38 LDL 103. Abnormal liver function studies. He presents for evaluation.  Past Medical History  Diagnosis Date  . Hepatitis B antibody positive   . Hypertension   . Diverticulosis   . Adenomatous colon polyp 10/1993  . Hemochromatosis     Compound Heterozygous for C282Y & H63D mutations  . Atrial fibrillation     Past Surgical History  Procedure Laterality  Date  . Cardioversion  08/18/2011    Procedure: CARDIOVERSION;  Surgeon: Andrew Sine, MD;  Location: [place];  Service: Cardiovascular;  Laterality: N/A;  . Cardioversion  11/22/2011    Procedure: CARDIOVERSION;  Surgeon: Andrew Sine, MD;  Location: [place];  Service: Cardiovascular;  Laterality: N/A;  . Tonsillectomy    . Laparoscopic cholecystectomy  04/2009  . Knee arthroscopy    . Colonoscopy      No Known Allergies  Current Outpatient Prescriptions  Medication Sig Dispense Refill  . amLODipine (NORVASC) 5 MG tablet Take 5 mg by mouth daily.      . fish oil-omega-3 fatty acids 1000 MG capsule Take 1 g by mouth daily.      [name] Kitchen lisinopril (PRINIVIL,ZESTRIL) 40 MG tablet Take 40 mg by mouth daily.      . metoprolol succinate (TOPROL-XL) 25 MG 24 hr tablet Take 25 mg by mouth daily.      . propafenone (RYTHMOL) 150 MG tablet TAKE ONE TABLET BY MOUTH EVERY EIGHT HOURS  90 tablet  5  . Tamsulosin HCl (FLOMAX) 0.4 MG CAPS Take 0.4 mg by mouth daily.      Andrew [name] 20 MG TABS tablet TAKE ONE TABLET BY MOUTH ONE TIME DAILY.  30 tablet  5   No current facility-administered medications for this visit.    History   Social History  . Marital Status: Married    Spouse Name: N/A    Number of Children: N/A  . Years of Education: N/A   Occupational History  .  Retired    Social History Main Topics  . Smoking status: Former Research scientist (life sciences)  . Smokeless tobacco: Never Used  . Alcohol Use: Yes     Comment: 2 glasses if wine daily  . Drug Use: No  . Sexual Activity: Not on file   Other Topics Concern  . Not on file   Social History Narrative  . No narrative on file    Family History  Problem Relation Age of Onset  . Esophageal cancer Father    Socially, he is retired. 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 He is married and has 3 children and 9 grandchildren. He does exercise. No tobacco use. He does drink occasional alcohol.   ROS is negative for fevers, chills or night sweats. He  denies any change in weight. He denies any visual changes or change in hearing. He is unaware of lymphadenopathy. He denies recurrent tachycardia palpitations. He denies chest pain. He denies abdominal pain. He denies nausea vomiting or diarrhea. Is unaware of any blood in his stool or urine. He denies claudication. He denies edema. He uses his BiPAP 100% of nights. He is unaware of breakthrough snoring. He denies daytime sleepiness.  He gets monthly phlebotomies. There is no edema.  He denies cold or heat intolerance. There is no diabetes. Other comprehensive 14 point system review is negative.  PE BP 142/70  Pulse 57  Ht 5' 11"  (1.803 m)  Wt 194 lb 11.2 oz (88.315 kg)  BMI 27.17 kg/m2  General: Alert, oriented, no distress.  Skin: normal turgor, no rashes HEENT: Normocephalic, atraumatic. Pupils round and reactive; sclera anicteric;no lid lag. No xanthelasmas. Nose without nasal septal hypertrophy Mouth/Parynx benign; Mallinpatti scale 3 Neck: No JVD, no carotid bruits with normal carotid upstroke Chest wall: Nontender to palpation Lungs: clear to ausculatation and percussion; no wheezing or rales Heart: RRR, s1 s2 normal 1/6 systolic murmur; no diastolic murmur. No S3 or S4 gallop. No rubs thrills or heaves. Abdomen: soft, nontender; no hepatosplenomehaly, BS+; abdominal aorta nontender and not dilated by palpation. Back: No CVA tenderness Musculoskeletal: No discomfort Pulses 2+ Extremities: no clubbing cyanosis or edema, Homan's sign negative  Neurologic: grossly nonfocal Psychological: Normal affect and mood  ECG: Sinus bradycardia at 51 per minute. First degree AV block; QTc interval 458 ms  LABS:  BMET    Component Value Date/Time   NA 139 04/22/2009 0830   K 4.1 04/22/2009 0830   CL 101 04/22/2009 0830   CO2 31 04/22/2009 0830   GLUCOSE 111* 04/22/2009 0830   BUN 9 04/22/2009 0830   CREATININE 0.78 04/22/2009 0830   CALCIUM 9.5 04/22/2009 0830   GFRNONAA >60  04/22/2009 0830   GFRAA  Value: >60        The eGFR has been calculated using the MDRD equation. This calculation has not been validated in all clinical situations. eGFR's persistently <60 mL/min signify possible Chronic Kidney Disease. 04/22/2009 0830     Hepatic Function Panel     Component Value Date/Time   PROT 7.0 07/07/2009 1448   ALBUMIN 4.0 07/07/2009 1448   AST 37 07/07/2009 1448   ALT 36 07/07/2009 1448   ALKPHOS 68 07/07/2009 1448   BILITOT 0.7 07/07/2009 1448   BILIDIR 0.2 07/07/2009 1448     CBC    Component Value Date/Time   WBC 7.1 06/25/2013 0948   RBC 5.09 06/25/2013 0948   HGB 15.7 06/25/2013 0948   HCT 45.3 06/25/2013 0948   PLT 181.0 06/25/2013 0948  MCV 89.0 06/25/2013 0948   MCHC 34.7 06/25/2013 0948   RDW 13.2 06/25/2013 0948   LYMPHSABS 1.5 06/25/2013 0948   MONOABS 0.8 06/25/2013 0948   EOSABS 0.2 06/25/2013 0948   BASOSABS 0.0 06/25/2013 0948     BNP No results found for this basename: probnp    Lipid Panel  No results found for this basename: chol,  trig,  hdl,  cholhdl,  vldl,  ldlcalc     RADIOLOGY: No results found.    ASSESSMENT AND PLAN:  Mr. Mccartin is maintaining sinus rhythm and is tolerating Lia Hopping a known 50 mg every 8 hours in addition to Toprol-XL 25 mg. His EKG today shows sinus rhythm at a heart rate of 57 beats per minute which is well-tolerated. He has stable first degree AV block with a PR interval of 224 ms. His blood pressure today is controlled on his current therapy. His BiPAP is working well. He denies any awareness of breakthrough snoring or residual hypersomnolence. He denies any recurrent atrial fibrillation and this has been being significantly helped by his treatment for his obstructive sleep apnea. He is asymptomatic. He is bradycardic but seems to feel well on his current therapy. His blood pressure is stable and repeat by me today was 122/64 on his metoprolol succinate 25 mg, lisinopril 40 mg, as well as amlodipine 5 mg. He denies any  peripheral edema. He's not having any bleeding on anticoagulation with Xarelto. I reviewed her laboratory from Dr. Thayer Jew for his office. I will see him in 6 months for cardiology reassessment.    Andrew Sine, MD, [facility]  08/13/2013 2:11 PM

## 2013-08-21 DIAGNOSIS — G4730 Sleep apnea, unspecified: Secondary | ICD-10-CM | POA: Diagnosis not present

## 2013-09-02 ENCOUNTER — Other Ambulatory Visit: Payer: Self-pay | Admitting: Cardiovascular Disease

## 2013-09-03 DIAGNOSIS — H40019 Open angle with borderline findings, low risk, unspecified eye: Secondary | ICD-10-CM | POA: Diagnosis not present

## 2013-09-03 DIAGNOSIS — H5200 Hypermetropia, unspecified eye: Secondary | ICD-10-CM | POA: Diagnosis not present

## 2013-09-03 DIAGNOSIS — H2510 Age-related nuclear cataract, unspecified eye: Secondary | ICD-10-CM | POA: Diagnosis not present

## 2013-09-11 ENCOUNTER — Other Ambulatory Visit (INDEPENDENT_AMBULATORY_CARE_PROVIDER_SITE_OTHER): Payer: Medicare Other

## 2013-09-11 LAB — CBC WITH DIFFERENTIAL/PLATELET
BASOS PCT: 0.5 % (ref 0.0–3.0)
Basophils Absolute: 0 10*3/uL (ref 0.0–0.1)
EOS ABS: 0.1 10*3/uL (ref 0.0–0.7)
Eosinophils Relative: 2.2 % (ref 0.0–5.0)
HCT: 43.7 % (ref 39.0–52.0)
HEMOGLOBIN: 15.2 g/dL (ref 13.0–17.0)
LYMPHS ABS: 1.5 10*3/uL (ref 0.7–4.0)
Lymphocytes Relative: 24.6 % (ref 12.0–46.0)
MCHC: 34.8 g/dL (ref 30.0–36.0)
MCV: 91.6 fl (ref 78.0–100.0)
MONO ABS: 0.7 10*3/uL (ref 0.1–1.0)
Monocytes Relative: 10.7 % (ref 3.0–12.0)
Neutro Abs: 3.9 10*3/uL (ref 1.4–7.7)
Neutrophils Relative %: 62 % (ref 43.0–77.0)
Platelets: 189 10*3/uL (ref 150.0–400.0)
RBC: 4.77 Mil/uL (ref 4.22–5.81)
RDW: 12.7 % (ref 11.5–14.6)
WBC: 6.3 10*3/uL (ref 4.5–10.5)

## 2013-11-07 ENCOUNTER — Other Ambulatory Visit: Payer: Self-pay | Admitting: Cardiovascular Disease

## 2013-11-11 ENCOUNTER — Other Ambulatory Visit: Payer: Self-pay | Admitting: Pharmacist Clinician (PhC)/ Clinical Pharmacy Specialist

## 2013-11-11 MED ORDER — RIVAROXABAN 20 MG PO TABS: 20.0000 mg | ORAL_TABLET | Freq: Every day | ORAL | Status: DC

## 2013-12-04 ENCOUNTER — Other Ambulatory Visit: Payer: Self-pay | Admitting: Dermatology

## 2013-12-04 DIAGNOSIS — D485 Neoplasm of uncertain behavior of skin: Secondary | ICD-10-CM | POA: Diagnosis not present

## 2013-12-04 DIAGNOSIS — L820 Inflamed seborrheic keratosis: Secondary | ICD-10-CM | POA: Diagnosis not present

## 2013-12-04 DIAGNOSIS — Z85828 Personal history of other malignant neoplasm of skin: Secondary | ICD-10-CM | POA: Diagnosis not present

## 2013-12-04 DIAGNOSIS — W57XXXA Bitten or stung by nonvenomous insect and other nonvenomous arthropods, initial encounter: Secondary | ICD-10-CM | POA: Diagnosis not present

## 2013-12-04 DIAGNOSIS — D239 Other benign neoplasm of skin, unspecified: Secondary | ICD-10-CM | POA: Diagnosis not present

## 2013-12-04 DIAGNOSIS — L570 Actinic keratosis: Secondary | ICD-10-CM | POA: Diagnosis not present

## 2013-12-04 DIAGNOSIS — T148 Other injury of unspecified body region: Secondary | ICD-10-CM | POA: Diagnosis not present

## 2013-12-04 DIAGNOSIS — L821 Other seborrheic keratosis: Secondary | ICD-10-CM | POA: Diagnosis not present

## 2013-12-04 DIAGNOSIS — B351 Tinea unguium: Secondary | ICD-10-CM | POA: Diagnosis not present

## 2013-12-18 ENCOUNTER — Other Ambulatory Visit: Payer: Self-pay | Admitting: Gastroenterology

## 2013-12-18 ENCOUNTER — Other Ambulatory Visit (INDEPENDENT_AMBULATORY_CARE_PROVIDER_SITE_OTHER): Payer: Medicare Other

## 2013-12-18 LAB — CBC WITH DIFFERENTIAL/PLATELET
BASOS ABS: 0 10*3/uL (ref 0.0–0.1)
Basophils Relative: 0.3 % (ref 0.0–3.0)
EOS ABS: 0.2 10*3/uL (ref 0.0–0.7)
Eosinophils Relative: 3 % (ref 0.0–5.0)
HCT: 46.2 % (ref 39.0–52.0)
HEMOGLOBIN: 15.9 g/dL (ref 13.0–17.0)
Lymphocytes Relative: 24 % (ref 12.0–46.0)
Lymphs Abs: 1.5 10*3/uL (ref 0.7–4.0)
MCHC: 34.4 g/dL (ref 30.0–36.0)
MCV: 92.4 fl (ref 78.0–100.0)
Monocytes Absolute: 0.5 10*3/uL (ref 0.1–1.0)
Monocytes Relative: 8.9 % (ref 3.0–12.0)
NEUTROS ABS: 3.9 10*3/uL (ref 1.4–7.7)
NEUTROS PCT: 63.8 % (ref 43.0–77.0)
Platelets: 174 10*3/uL (ref 150.0–400.0)
RBC: 5 Mil/uL (ref 4.22–5.81)
RDW: 12.4 % (ref 11.5–15.5)
WBC: 6.1 10*3/uL (ref 4.0–10.5)

## 2013-12-18 LAB — HEMATOCRIT: HEMATOCRIT: 46.2 % (ref 39.0–52.0)

## 2013-12-18 LAB — HEMOGLOBIN: Hemoglobin: 15.9 g/dL (ref 13.0–17.0)

## 2013-12-19 ENCOUNTER — Other Ambulatory Visit: Payer: Self-pay | Admitting: Gastroenterology

## 2013-12-23 ENCOUNTER — Other Ambulatory Visit: Payer: Self-pay | Admitting: Cardiovascular Disease

## 2013-12-23 NOTE — Telephone Encounter (Signed)
Rx was sent to pharmacy electronically. 

## 2014-03-05 ENCOUNTER — Encounter: Payer: Self-pay | Admitting: Cardiovascular Disease

## 2014-03-05 ENCOUNTER — Ambulatory Visit (INDEPENDENT_AMBULATORY_CARE_PROVIDER_SITE_OTHER): Payer: Medicare Other | Admitting: Cardiovascular Disease

## 2014-03-05 VITALS — BP 120/80 | HR 54 | Ht 71.0 in | Wt 202.3 lb

## 2014-03-05 DIAGNOSIS — Z7901 Long term (current) use of anticoagulants: Secondary | ICD-10-CM | POA: Diagnosis not present

## 2014-03-05 DIAGNOSIS — I480 Paroxysmal atrial fibrillation: Secondary | ICD-10-CM

## 2014-03-05 DIAGNOSIS — G4733 Obstructive sleep apnea (adult) (pediatric): Secondary | ICD-10-CM | POA: Diagnosis not present

## 2014-03-05 DIAGNOSIS — Z9989 Dependence on other enabling machines and devices: Secondary | ICD-10-CM

## 2014-03-05 DIAGNOSIS — I10 Essential (primary) hypertension: Secondary | ICD-10-CM | POA: Diagnosis not present

## 2014-03-05 NOTE — Patient Instructions (Signed)
Your physician has recommended you make the following change in your medication: cut the metoprolol to 1/2 tablet daily.  Your physician wants you to follow-up in:  6 months or sooner if needed. You will receive a reminder letter in the mail two months in advance. If you don't receive a letter, please call our office to schedule the follow-up appointment.

## 2014-03-05 NOTE — Progress Notes (Signed)
Patient ID: Andrew [name], male   DOB: Feb 14, 1931, 80 y.o.   MRN: [id number]      HPI: Andrew [name] is a 80 y.o. male who presents for six-month cardiology evaluation.  Mrs. Hackman has a history of paroxysmal atrial fibrillation. He was found to be in atrial fibrillation initially in February 2013 and was placed on Xarelto and beta blocker therapy. He developed recurrent atrial fibrillation after his initial cardioversion. He was found to have obstructive sleep apnea and prior to a second cardioversion CPAP/BiPAP was instituted. He has been on BiPAP therapy ever since. His second cardioversion June 2013 was successful and with treatment of his sleep apnea and propefenone he has maintained sinus rhythm. His MDE company is [employer]. Over the past 6 months, he is unaware of any breakthrough atrial fibrillation or tachycardic spells. He uses his BiPAP with 100% compliance. Typically he goes to bed approximately 8 PM and wakes up between 5:30 and 6 AM. His sleep is restorative. He is unaware breakthrough snoring. There is no residual daytime sleepiness.  He does have a history of hemachromatosis and has phlebotomies monthly. He denies chest pain. He denies palpitations. He has a history of hypertension. His last laboratory at Dr. Pennie Banter office revealed anormal CBC and chemistry profile. Total cholesterol 165 triglycerides 119 HDL 38 LDL 103.   Present, he is unaware of any breakthrough atrial fibrillation.  He does admit to fatigability.  He does exercise at [employer] 3 times per week.  He does less gardening and he had in the past.  He denies any bleeding.  His most recent hemoglobin and hematocrit from July 6 18 2015 was 15.9 and 46.2.  Past Medical History  Diagnosis Date  . Hepatitis B antibody positive   . Hypertension   . Diverticulosis   . Adenomatous colon polyp 10/1993  . Hemochromatosis     Compound Heterozygous for C282Y & H63D mutations  . Atrial fibrillation      Past Surgical History  Procedure Laterality Date  . Cardioversion  08/18/2011    Procedure: CARDIOVERSION;  Surgeon: Troy Sine, MD;  Location: [place];  Service: Cardiovascular;  Laterality: N/A;  . Cardioversion  11/22/2011    Procedure: CARDIOVERSION;  Surgeon: Troy Sine, MD;  Location: [place];  Service: Cardiovascular;  Laterality: N/A;  . Tonsillectomy    . Laparoscopic cholecystectomy  04/2009  . Knee arthroscopy    . Colonoscopy      No Known Allergies  Current Outpatient Prescriptions  Medication Sig Dispense Refill  . amLODipine (NORVASC) 5 MG tablet Take 5 mg by mouth daily.      [name] Kitchen lisinopril (PRINIVIL,ZESTRIL) 40 MG tablet Take 40 mg by mouth daily.      . metoprolol succinate (TOPROL-XL) 25 MG 24 hr tablet Take 25 mg by mouth daily.      . propafenone (RYTHMOL) 150 MG tablet TAKE 1 TABLET BY MOUTH EVERY 8 HOURS.  90 tablet  6  . rivaroxaban (XARELTO) 20 MG TABS tablet Take 1 tablet (20 mg total) by mouth daily with supper.  30 tablet  5  . Tamsulosin HCl (FLOMAX) 0.4 MG CAPS Take 0.4 mg by mouth daily.       No current facility-administered medications for this visit.    History   Social History  . Marital Status: Married    Spouse Name: N/A    Number of Children: N/A  . Years of Education: N/A   Occupational History  .  Retired    Social History Main Topics  . Smoking status: Former Research scientist (life sciences)  . Smokeless tobacco: Never Used  . Alcohol Use: Yes     Comment: 2 glasses if wine daily  . Drug Use: No  . Sexual Activity: Not on file   Other Topics Concern  . Not on file   Social History Narrative  . No narrative on file    Family History  Problem Relation Age of Onset  . 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 He does exercise. No tobacco use. He does drink occasional alcohol.  ROS General: Negative; No fevers, chills, or night sweats;  positive for fatigue HEENT: Negative; No changes in vision or hearing, sinus congestion, difficulty swallowing Pulmonary: Negative; No cough, wheezing, shortness of breath, hemoptysis Cardiovascular: Negative; No chest pain, presyncope, syncope, palpitations GI: Negative; No nausea, vomiting, diarrhea, or abdominal pain GU: Negative; No dysuria, hematuria, or difficulty voiding Musculoskeletal: Negative; no myalgias, joint pain, or weakness Hematologic/Oncology: Positive for hemochromatosis undergoing monthly phlebotomies; no easy bruising, bleeding Endocrine: Negative; no heat/cold intolerance; no diabetes Neuro: Negative; no changes in balance, headaches Skin: Negative; No rashes or skin lesions Psychiatric: Negative; No behavioral problems, depression Sleep: Positive for sleep apnea on CPAP therapy with 100% compliance.  No snoring, daytime sleepiness, hypersomnolence, bruxism, restless legs, hypnogognic hallucinations, no cataplexy Other comprehensive 14 point system review is negative.   PE BP 120/80  Pulse 54  Ht _0  (1.803 m)  Wt 202 lb 4.8 oz (91.763 kg)  BMI 28.23 kg/m2  General: Alert, oriented, no distress.  Skin: normal turgor, no rashes HEENT: Normocephalic, atraumatic. Pupils round and reactive; sclera anicteric;no lid lag. No xanthelasmas. Nose without nasal septal hypertrophy Mouth/Parynx benign; Mallinpatti scale 3 Neck: No JVD, no carotid bruits with normal carotid upstroke Chest wall: Nontender to palpation Lungs: clear to ausculatation and percussion; no wheezing or rales Heart: RRR, s1 s2 normal 1/6 systolic murmur; no diastolic murmur. No S3 or S4 gallop. No rubs thrills or heaves. Abdomen: soft, nontender; no hepatosplenomehaly, BS+; abdominal aorta nontender and not dilated by palpation. Back: No CVA tenderness Musculoskeletal: No discomfort Pulses 2+ Extremities: no clubbing cyanosis or edema, Homan's sign negative  Neurologic: grossly  nonfocal Psychological: Normal affect and mood  ECG (independently read by me): Sinus bradycardia at 54 beats per minute with first-degree AV block.  Nonspecific T changes.  Prior March 2015 ECG: Sinus bradycardia at 51 per minute. First degree AV block; QTc interval 458 ms  LABS:  BMET    Component Value Date/Time   NA 139 04/22/2009 0830   K 4.1 04/22/2009 0830   CL 101 04/22/2009 0830   CO2 31 04/22/2009 0830   GLUCOSE 111* 04/22/2009 0830   BUN 9 04/22/2009 0830   CREATININE 0.78 04/22/2009 0830   CALCIUM 9.5 04/22/2009 0830   GFRNONAA >60 04/22/2009 0830   GFRAA  Value: >60        The eGFR has been calculated using the MDRD equation. This calculation has not been validated in all clinical situations. eGFR's persistently <60 mL/min signify possible Chronic Kidney Disease. 04/22/2009 0830     Hepatic Function Panel     Component Value Date/Time   PROT 7.0 07/07/2009 1448   ALBUMIN 4.0 07/07/2009 1448   AST 37 07/07/2009 1448   ALT 36 07/07/2009 1448   ALKPHOS 68 07/07/2009 1448   BILITOT  0.7 07/07/2009 1448   BILIDIR 0.2 07/07/2009 1448     CBC    Component Value Date/Time   WBC 6.1 12/18/2013 1043   RBC 5.00 12/18/2013 1043   HGB 15.9 12/18/2013 1048   HCT 46.2 12/18/2013 1048   PLT 174.0 12/18/2013 1043   MCV 92.4 12/18/2013 1043   MCHC 34.4 12/18/2013 1043   RDW 12.4 12/18/2013 1043   LYMPHSABS 1.5 12/18/2013 1043   MONOABS 0.5 12/18/2013 1043   EOSABS 0.2 12/18/2013 1043   BASOSABS 0.0 12/18/2013 1043     BNP No results found for this basename: probnp    Lipid Panel  No results found for this basename: chol,  trig,  hdl,  cholhdl,  vldl,  ldlcalc     RADIOLOGY: No results found.    ASSESSMENT AND PLAN:  Mr. Vigen is maintaining sinus rhythm on his current medical regimen consisting of propofenone 150 mg every 8 hours in addition to Toprol-XL 25 mg and aso takes lisinopril 40 mg and amlodipine 5 mg for blood pressure.  His blood pressure today is stable.  He does  not have orthostatic change.  He is bradycardic and has first degree AV block.  I have recommended he decrease his Toprol-XL from 25 mg to 12.5 mg daily, which may help his fatigue and decreased energy.  He continues to be 100% compliant with reference to his obstructive sleep apnea.  He is sleeping adequate duration and admits to at least 8-9 hours of sleep at minimum daily.  He's not having any bleeding issues on Xarelto for anticoagulation.  Dr. Shelia Media will be rechecking laboratory in January.  As long as Mr. Deshler remains stable, we'll see him in 6 months for cardiology reevaluation or sooner if problem arise.  Troy Sine, MD, [facility]  03/05/2014 9:02 AM

## 2014-03-23 ENCOUNTER — Other Ambulatory Visit (INDEPENDENT_AMBULATORY_CARE_PROVIDER_SITE_OTHER): Payer: Medicare Other

## 2014-03-23 LAB — CBC WITH DIFFERENTIAL/PLATELET
BASOS ABS: 0 10*3/uL (ref 0.0–0.1)
Basophils Relative: 0.3 % (ref 0.0–3.0)
EOS PCT: 2.8 % (ref 0.0–5.0)
Eosinophils Absolute: 0.2 10*3/uL (ref 0.0–0.7)
HEMATOCRIT: 47 % (ref 39.0–52.0)
HEMOGLOBIN: 15.8 g/dL (ref 13.0–17.0)
LYMPHS ABS: 1.6 10*3/uL (ref 0.7–4.0)
LYMPHS PCT: 22.2 % (ref 12.0–46.0)
MCHC: 33.7 g/dL (ref 30.0–36.0)
MCV: 92.9 fl (ref 78.0–100.0)
MONOS PCT: 6.9 % (ref 3.0–12.0)
Monocytes Absolute: 0.5 10*3/uL (ref 0.1–1.0)
Neutro Abs: 4.8 10*3/uL (ref 1.4–7.7)
Neutrophils Relative %: 67.8 % (ref 43.0–77.0)
Platelets: 192 10*3/uL (ref 150.0–400.0)
RBC: 5.06 Mil/uL (ref 4.22–5.81)
RDW: 12.6 % (ref 11.5–15.5)
WBC: 7 10*3/uL (ref 4.0–10.5)

## 2014-03-23 LAB — IBC PANEL
Iron: 177 ug/dL — ABNORMAL HIGH (ref 42–165)
SATURATION RATIOS: 62.6 % — AB (ref 20.0–50.0)
TRANSFERRIN: 201.9 mg/dL — AB (ref 212.0–360.0)

## 2014-03-23 LAB — FERRITIN: Ferritin: 64.4 ng/mL (ref 22.0–322.0)

## 2014-04-28 DIAGNOSIS — Z23 Encounter for immunization: Secondary | ICD-10-CM | POA: Diagnosis not present

## 2014-05-15 DIAGNOSIS — H40013 Open angle with borderline findings, low risk, bilateral: Secondary | ICD-10-CM | POA: Diagnosis not present

## 2014-06-10 ENCOUNTER — Other Ambulatory Visit: Payer: Self-pay | Admitting: Cardiovascular Disease

## 2014-06-22 ENCOUNTER — Other Ambulatory Visit (INDEPENDENT_AMBULATORY_CARE_PROVIDER_SITE_OTHER): Payer: Medicare Other

## 2014-06-22 LAB — HEMOGLOBIN: Hemoglobin: 16.4 g/dL (ref 13.0–17.0)

## 2014-06-22 LAB — HEMATOCRIT: HCT: 48.5 % (ref 39.0–52.0)

## 2014-06-29 DIAGNOSIS — R350 Frequency of micturition: Secondary | ICD-10-CM | POA: Diagnosis not present

## 2014-06-29 DIAGNOSIS — N401 Enlarged prostate with lower urinary tract symptoms: Secondary | ICD-10-CM | POA: Diagnosis not present

## 2014-06-29 DIAGNOSIS — R972 Elevated prostate specific antigen [PSA]: Secondary | ICD-10-CM | POA: Diagnosis not present

## 2014-07-06 DIAGNOSIS — H2513 Age-related nuclear cataract, bilateral: Secondary | ICD-10-CM | POA: Diagnosis not present

## 2014-07-06 DIAGNOSIS — H40003 Preglaucoma, unspecified, bilateral: Secondary | ICD-10-CM | POA: Diagnosis not present

## 2014-08-10 ENCOUNTER — Encounter: Payer: Self-pay | Admitting: *Deleted

## 2014-08-18 ENCOUNTER — Other Ambulatory Visit: Payer: Self-pay | Admitting: Cardiovascular Disease

## 2014-08-18 NOTE — Telephone Encounter (Signed)
Rx(s) sent to pharmacy electronically.  

## 2014-08-24 ENCOUNTER — Encounter: Payer: Self-pay | Admitting: Cardiovascular Disease

## 2014-08-24 ENCOUNTER — Ambulatory Visit (INDEPENDENT_AMBULATORY_CARE_PROVIDER_SITE_OTHER): Payer: Medicare Other | Admitting: Cardiovascular Disease

## 2014-08-24 VITALS — BP 140/88 | HR 63 | Ht 71.0 in | Wt 205.0 lb

## 2014-08-24 DIAGNOSIS — G4733 Obstructive sleep apnea (adult) (pediatric): Secondary | ICD-10-CM

## 2014-08-24 DIAGNOSIS — Z7901 Long term (current) use of anticoagulants: Secondary | ICD-10-CM

## 2014-08-24 DIAGNOSIS — I480 Paroxysmal atrial fibrillation: Secondary | ICD-10-CM

## 2014-08-24 DIAGNOSIS — I10 Essential (primary) hypertension: Secondary | ICD-10-CM | POA: Diagnosis not present

## 2014-08-24 DIAGNOSIS — Z9989 Dependence on other enabling machines and devices: Secondary | ICD-10-CM

## 2014-08-24 NOTE — Patient Instructions (Signed)
Your physician wants you to follow-up in: 6 Months You will receive a reminder letter in the mail two months in advance. If you don't receive a letter, please call our office to schedule the follow-up appointment.  

## 2014-08-24 NOTE — Progress Notes (Signed)
Patient ID: Andrew [name], male   DOB: Oct 08, 1930, 81 y.o.   MRN: [id number]    HPI: Andrew [name] is a 81 y.o. male who presents for six-month cardiology evaluation.  Mrs. Mcvay has a history of paroxysmal atrial fibrillation. He was found to be in atrial fibrillation initially in February 2013 and was placed on Xarelto and beta blocker therapy. He developed recurrent atrial fibrillation after his initial cardioversion. He was found to have obstructive sleep apnea and prior to a second cardioversion CPAP/BiPAP was instituted. He has been on BiPAP therapy ever since. His second cardioversion June 2013 was successful and with treatment of his sleep apnea and propofenone/ toprol he has maintained sinus rhythm. His M[employer] company is [employer].  Since I last saw him he is unaware of any breakthrough atrial fibrillation or tachycardic spells. He uses his BiPAP with 100% compliance. Typically he goes to bed approximately 8 PM and wakes up between 5:30 and 6 AM. His sleep is restorative. He is unaware breakthrough snoring. There is no residual daytime sleepiness.  He has a history of hemachromatosis and has phlebotomies monthly. He denies chest pain. He denies palpitations. He has a history of hypertension and has been on amlodipine 5 mg, lisinopril 40 mg, Toprol XL 25 mg. He states that his BP at home typically in the 671'I systolic.   He will be seeing Dr. Shelia Media who will be checking his laboratory.  Past Medical History  Diagnosis Date  . Hepatitis B antibody positive   . Hypertension   . Diverticulosis   . Adenomatous colon polyp 10/1993  . Hemochromatosis     Compound Heterozygous for C282Y & H63D mutations  . PAF (paroxysmal atrial fibrillation)   . OSA treated with BiPAP     Past Surgical History  Procedure Laterality Date  . Cardioversion  08/18/2011    Procedure: CARDIOVERSION;  Surgeon: Troy Sine, MD;  Location: [place];  Service: Cardiovascular;  Laterality: N/A;  .  Cardioversion  11/22/2011    Procedure: CARDIOVERSION;  Surgeon: Troy Sine, MD;  Location: [place];  Service: Cardiovascular;  Laterality: N/A;  . Tonsillectomy    . Laparoscopic cholecystectomy  04/2009  . Knee arthroscopy    . Colonoscopy    . US echocardiography  07/27/2011    Mild concentric LVH, LA & RA is moderately dilated, mild to mod MR.  [name] Kitchen Nm myocar perf wall motion  05/08/2008    Normal    No Known Allergies  Current Outpatient Prescriptions  Medication Sig Dispense Refill  . amLODipine (NORVASC) 5 MG tablet Take 5 mg by mouth daily.    [name] Kitchen lisinopril (PRINIVIL,ZESTRIL) 40 MG tablet Take 40 mg by mouth daily.    . metoprolol succinate (TOPROL-XL) 25 MG 24 hr tablet Take 25 mg by mouth daily.    . propafenone (RYTHMOL) 150 MG tablet TAKE 1 TABLET BY MOUTH EVERY 8 HOURS 90 tablet 6  . Tamsulosin HCl (FLOMAX) 0.4 MG CAPS Take 0.4 mg by mouth daily.    Alveda Reasons 20 MG TABS tablet TAKE 1 TABLET BY MOUTH DAILY WITH SUPPER 30 tablet 5   No current facility-administered medications for this visit.    History   Social History  . Marital Status: Married    Spouse Name: N/A  . Number of Children: N/A  . Years of Education: N/A   Occupational History  . Retired    Social History Main Topics  . Smoking status: Former Research scientist (life sciences)  .  Smokeless tobacco: Never Used  . Alcohol Use: 0.0 oz/week    0 Standard drinks or equivalent per week     Comment: 2 glasses if wine daily  . Drug Use: No  . Sexual Activity: Not on file   Other Topics Concern  . Not on file   Social History Narrative    Family History  Problem Relation Age of Onset  . Esophageal cancer Father   . Emphysema Mother   . Stroke Andrew [name], he is retired. He is the owner of [employer]. He is married and has 3 children and 9 grandchildren. He does exercise. No tobacco use. He does drink occasional alcohol.  ROS General: Negative; No fevers, chills, or night sweats; positive for fatigue HEENT:  Negative; No changes in vision or hearing, sinus congestion, difficulty swallowing Pulmonary: Negative; No cough, wheezing, shortness of breath, hemoptysis Cardiovascular: Negative; No chest pain, presyncope, syncope, palpitations GI: Negative; No nausea, vomiting, diarrhea, or abdominal pain GU: Negative; No dysuria, hematuria, or difficulty voiding Musculoskeletal: Negative; no myalgias, joint pain, or weakness Hematologic/Oncology: Positive for hemochromatosis undergoing monthly phlebotomies; no easy bruising, bleeding Endocrine: Negative; no heat/cold intolerance; no diabetes Neuro: Negative; no changes in balance, headaches Skin: Negative; No rashes or skin lesions Psychiatric: Negative; No behavioral problems, depression Sleep: Positive for sleep apnea on CPAP therapy with 100% compliance.  No snoring, daytime sleepiness, hypersomnolence, bruxism, restless legs, hypnogognic hallucinations, no cataplexy Other comprehensive 14 point system review is negative.   PE BP 140/88 mmHg  Pulse 63  Ht 5\' 11"  (1.803 m)  Wt 205 lb (92.987 kg)  BMI 28.60 kg/m2  General: Alert, oriented, no distress.  Skin: normal turgor, no rashes HEENT: Normocephalic, atraumatic. Pupils round and reactive; sclera anicteric;no lid lag. No xanthelasmas. Nose without nasal septal hypertrophy Mouth/Parynx benign; Mallinpatti scale 3 Neck: No JVD, no carotid bruits with normal carotid upstroke Chest wall: Nontender to palpation Lungs: clear to ausculatation and percussion; no wheezing or rales Heart: RRR, s1 s2 normal 1/6 systolic murmur; no diastolic murmur. No S3 or S4 gallop. No rubs thrills or heaves. Abdomen: soft, nontender; no hepatosplenomehaly, BS+; abdominal aorta nontender and not dilated by palpation. Back: No CVA tenderness Musculoskeletal: No discomfort Pulses 2+ Extremities: no clubbing cyanosis or edema, Homan's sign negative  Neurologic: grossly nonfocal Psychological: Normal affect and  mood  ECG (independently read by me): NSR at 63 with first-degree AV block.   October 2015 ECG (independently read by me): Sinus bradycardia at 54 beats per minute with first-degree AV block.  Nonspecific T changes.  Prior March 2015 ECG: Sinus bradycardia at 51 per minute. First degree AV block; QTc interval 458 ms  LABS:  BMET  BMP Latest Ref Rng 04/22/2009  Glucose 70 - 99 mg/dL 111(H)  BUN 6 - 23 mg/dL 9  Creatinine 0.4 - 1.5 mg/dL 0.78  Sodium 135 - 145 mEq/L 139  Potassium 3.5 - 5.1 mEq/L 4.1  Chloride 96 - 112 mEq/L 101  CO2 19 - 32 mEq/L 31  Calcium 8.4 - 10.5 mg/dL 9.5     Hepatic Function Panel     Component Value Date/Time   PROT 7.0 07/07/2009 1448   ALBUMIN 4.0 07/07/2009 1448   AST 37 07/07/2009 1448   ALT 36 07/07/2009 1448   ALKPHOS 68 07/07/2009 1448   BILITOT 0.7 07/07/2009 1448   BILIDIR 0.2 07/07/2009 1448     CBC  CBC Latest Ref Rng 06/22/2014 03/23/2014 12/18/2013  WBC 4.0 - 10.5  K/uL - 7.0 -  Hemoglobin 13.0 - 17.0 g/dL 16.4 15.8 15.9  Hematocrit 39.0 - 52.0 % 48.5 47.0 46.2  Platelets 150.0 - 400.0 K/uL - 192.0 -     BNP No results found for: PROBNP  Lipid Panel  No results found for: CHOL   RADIOLOGY: No results found.    ASSESSMENT AND PLAN:  Mr. Keziah is maintaining sinus rhythm on his current medical regimen consisting of propofenone 150 mg every 8 hours in addition to Toprol-XL 25 mg.  He aso takes lisinopril 40 mg and amlodipine 5 mg for blood pressure.  His blood pressure today is mildly increased at 140/88 when taken by the nurse and 154/80 taken by me.  He does not have orthostatic change.  He is no longer bradycardic but has first degree block.    He continues to be 100% compliant with reference to his obstructive sleep apnea.  He is sleeping adequate duration and admits to at least 8-9 hours of sleep at minimum daily. He has more energy, and since he is on treatment for his OSA there has not been recurrence of his PAF.  He's not having any bleeding issues on Xarelto for anticoagulation.  Dr. Shelia Media will be rechecking laboratory and I will ask that he send me a copy for my review. .  As long as Mr. Larusso remains stable, I will see him in 6 months for cardiology reevaluation or sooner if problem arise.  Troy Sine, MD, [facility]  08/24/2014 9:39 PM

## 2014-09-04 DIAGNOSIS — E785 Hyperlipidemia, unspecified: Secondary | ICD-10-CM | POA: Diagnosis not present

## 2014-09-04 DIAGNOSIS — E780 Pure hypercholesterolemia: Secondary | ICD-10-CM | POA: Diagnosis not present

## 2014-09-04 DIAGNOSIS — Z0000 Encounter for general adult medical examination without abnormal findings: Secondary | ICD-10-CM | POA: Diagnosis not present

## 2014-09-23 DIAGNOSIS — G4733 Obstructive sleep apnea (adult) (pediatric): Secondary | ICD-10-CM | POA: Diagnosis not present

## 2014-09-23 DIAGNOSIS — R7309 Other abnormal glucose: Secondary | ICD-10-CM | POA: Diagnosis not present

## 2014-09-23 DIAGNOSIS — N401 Enlarged prostate with lower urinary tract symptoms: Secondary | ICD-10-CM | POA: Diagnosis not present

## 2014-09-23 DIAGNOSIS — E780 Pure hypercholesterolemia: Secondary | ICD-10-CM | POA: Diagnosis not present

## 2014-09-30 ENCOUNTER — Other Ambulatory Visit: Payer: Medicare Other

## 2014-10-01 ENCOUNTER — Other Ambulatory Visit (INDEPENDENT_AMBULATORY_CARE_PROVIDER_SITE_OTHER): Payer: Medicare Other

## 2014-10-01 DIAGNOSIS — Z1212 Encounter for screening for malignant neoplasm of rectum: Secondary | ICD-10-CM | POA: Diagnosis not present

## 2014-10-01 LAB — HEMOGLOBIN: HEMOGLOBIN: 15.7 g/dL (ref 13.0–17.0)

## 2014-10-01 LAB — HEMATOCRIT: HCT: 44.6 % (ref 39.0–52.0)

## 2014-10-20 DIAGNOSIS — J40 Bronchitis, not specified as acute or chronic: Secondary | ICD-10-CM | POA: Diagnosis not present

## 2014-10-27 DIAGNOSIS — R7309 Other abnormal glucose: Secondary | ICD-10-CM | POA: Diagnosis not present

## 2014-11-04 DIAGNOSIS — R7309 Other abnormal glucose: Secondary | ICD-10-CM | POA: Diagnosis not present

## 2015-01-01 ENCOUNTER — Other Ambulatory Visit: Payer: Self-pay

## 2015-01-01 ENCOUNTER — Telehealth: Payer: Self-pay | Admitting: Gastroenterology

## 2015-01-01 NOTE — Telephone Encounter (Signed)
Patient is due for q 3 month phlebotomy and needs to have new orders placed. His wife is advisied that they are in and he can come at his convenience

## 2015-01-04 ENCOUNTER — Other Ambulatory Visit (INDEPENDENT_AMBULATORY_CARE_PROVIDER_SITE_OTHER): Payer: Medicare Other

## 2015-01-04 LAB — HEMATOCRIT: HCT: 43.6 % (ref 39.0–52.0)

## 2015-01-04 LAB — HEMOGLOBIN: Hemoglobin: 15.1 g/dL (ref 13.0–17.0)

## 2015-02-05 DIAGNOSIS — D692 Other nonthrombocytopenic purpura: Secondary | ICD-10-CM | POA: Diagnosis not present

## 2015-02-05 DIAGNOSIS — D1801 Hemangioma of skin and subcutaneous tissue: Secondary | ICD-10-CM | POA: Diagnosis not present

## 2015-02-05 DIAGNOSIS — L820 Inflamed seborrheic keratosis: Secondary | ICD-10-CM | POA: Diagnosis not present

## 2015-02-05 DIAGNOSIS — L308 Other specified dermatitis: Secondary | ICD-10-CM | POA: Diagnosis not present

## 2015-02-05 DIAGNOSIS — D2271 Melanocytic nevi of right lower limb, including hip: Secondary | ICD-10-CM | POA: Diagnosis not present

## 2015-02-05 DIAGNOSIS — Z85828 Personal history of other malignant neoplasm of skin: Secondary | ICD-10-CM | POA: Diagnosis not present

## 2015-02-05 DIAGNOSIS — D224 Melanocytic nevi of scalp and neck: Secondary | ICD-10-CM | POA: Diagnosis not present

## 2015-02-05 DIAGNOSIS — L570 Actinic keratosis: Secondary | ICD-10-CM | POA: Diagnosis not present

## 2015-02-05 DIAGNOSIS — L817 Pigmented purpuric dermatosis: Secondary | ICD-10-CM | POA: Diagnosis not present

## 2015-02-05 DIAGNOSIS — L821 Other seborrheic keratosis: Secondary | ICD-10-CM | POA: Diagnosis not present

## 2015-02-05 DIAGNOSIS — L918 Other hypertrophic disorders of the skin: Secondary | ICD-10-CM | POA: Diagnosis not present

## 2015-02-22 ENCOUNTER — Other Ambulatory Visit: Payer: Self-pay | Admitting: *Deleted

## 2015-02-23 ENCOUNTER — Ambulatory Visit (INDEPENDENT_AMBULATORY_CARE_PROVIDER_SITE_OTHER): Payer: Medicare Other | Admitting: Cardiovascular Disease

## 2015-02-23 ENCOUNTER — Other Ambulatory Visit: Payer: Self-pay | Admitting: Pharmacist Clinician (PhC)/ Clinical Pharmacy Specialist

## 2015-02-23 VITALS — BP 122/72 | HR 59 | Ht 71.0 in | Wt 193.7 lb

## 2015-02-23 DIAGNOSIS — I10 Essential (primary) hypertension: Secondary | ICD-10-CM

## 2015-02-23 DIAGNOSIS — I480 Paroxysmal atrial fibrillation: Secondary | ICD-10-CM

## 2015-02-23 DIAGNOSIS — Z7901 Long term (current) use of anticoagulants: Secondary | ICD-10-CM | POA: Diagnosis not present

## 2015-02-23 MED ORDER — RIVAROXABAN 20 MG PO TABS: ORAL_TABLET | ORAL | Status: DC

## 2015-02-23 NOTE — Patient Instructions (Signed)
Your physician wants you to follow-up in: 1 year or sooner if needed. You will receive a reminder letter in the mail two months in advance. If you don't receive a letter, please call our office to schedule the follow-up appointment.  

## 2015-02-25 ENCOUNTER — Encounter: Payer: Self-pay | Admitting: Cardiovascular Disease

## 2015-02-25 NOTE — Progress Notes (Signed)
Patient ID: THOM OLLINGER, male   DOB: 12-28-1930, 81 y.o.   MRN: [id number]    HPI: FAYE SANFILIPPO is a 81 y.o. male who presents for six-month cardiology evaluation.  Mrs. Kranz has a history of paroxysmal atrial fibrillation. He was initially found to be in atrial fibrillation  in February 2013 and was placed on Xarelto and beta blocker therapy. He developed recurrent atrial fibrillation after his initial cardioversion. He was found to have obstructive sleep apnea and prior to a second cardioversion CPAP/BiPAP was instituted. He has been on BiPAP therapy ever since. His second cardioversion June 2013 was successful and with treatment of his sleep apnea and propofenone/ toprol he has maintained sinus rhythm. His DME company is [employer].  Since I last saw him he is unaware of any breakthrough atrial fibrillation or tachycardic spells. He uses his BiPAP with 100% compliance. Typically he goes to bed approximately 8 PM and wakes up between 5:30 and 6 AM. His sleep is restorative. He is unaware breakthrough snoring. There is no residual daytime sleepiness.  He has a history of hemachromatosis and has phlebotomies monthly. He denies chest pain. He denies palpitations. He has a history of hypertension and has been on amlodipine 5 mg, lisinopril 40 mg, Toprol XL 25 mg. He states that his BP at home typically in the 756'E systolic.   Over the past year, he continues to do well.  He denies chest pain.  He denies PND, orthopnea.  He is using CPAP with 100% compliance and typically sleeps 8-9 hours per night.  His sleep is restful.  He denies residual daytime sleepiness or any breakthrough snoring.  He denies bleeding on Xarelto.  He recently had laboratory by Dr. Shelia Media.  He presents for evaluation  Past Medical History  Diagnosis Date  . Hepatitis B antibody positive   . Hypertension   . Diverticulosis   . Adenomatous colon polyp 10/1993  . Hemochromatosis     Compound Heterozygous for C282Y  & H63D mutations  . PAF (paroxysmal atrial fibrillation)   . OSA treated with BiPAP     Past Surgical History  Procedure Laterality Date  . Cardioversion  08/18/2011    Procedure: CARDIOVERSION;  Surgeon: Troy Sine, MD;  Location: [place];  Service: Cardiovascular;  Laterality: N/A;  . Cardioversion  11/22/2011    Procedure: CARDIOVERSION;  Surgeon: Troy Sine, MD;  Location: [place];  Service: Cardiovascular;  Laterality: N/A;  . Tonsillectomy    . Laparoscopic cholecystectomy  04/2009  . Knee arthroscopy    . Colonoscopy    . US echocardiography  07/27/2011    Mild concentric LVH, LA & RA is moderately dilated, mild to mod MR.  [name] Kitchen Nm myocar perf wall motion  05/08/2008    Normal    No Known Allergies  Current Outpatient Prescriptions  Medication Sig Dispense Refill  . amLODipine (NORVASC) 5 MG tablet Take 5 mg by mouth daily.    [name] Kitchen lisinopril (PRINIVIL,ZESTRIL) 40 MG tablet Take 40 mg by mouth daily.    . metoprolol succinate (TOPROL-XL) 25 MG 24 hr tablet Take 25 mg by mouth daily.    . propafenone (RYTHMOL) 150 MG tablet TAKE 1 TABLET BY MOUTH EVERY 8 HOURS 90 tablet 6  . rivaroxaban (XARELTO) 20 MG TABS tablet TAKE 1 TABLET BY MOUTH DAILY WITH SUPPER 90 tablet 3  . Tamsulosin HCl (FLOMAX) 0.4 MG CAPS Take 0.4 mg by mouth daily.     No current  facility-administered medications for this visit.    Social History   Social History  . Marital Status: Married    Spouse Name: N/A  . Number of Children: N/A  . Years of Education: N/A   Occupational History  . Retired    Social History Main Topics  . Smoking status: Former Research scientist (life sciences)  . Smokeless tobacco: Never Used  . Alcohol Use: 0.0 oz/week    0 Standard drinks or equivalent per week     Comment: 2 glasses if wine daily  . Drug Use: No  . Sexual Activity: Not on file   Other Topics Concern  . Not on file   Social History Narrative    Family History  Problem Relation Age of Onset  . Esophageal cancer Father   .  Emphysema Mother   . Stroke [name], he is retired. 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 He does exercise. No tobacco use. He does drink occasional alcohol.  ROS General: Negative; No fevers, chills, or night sweats; positive for fatigue HEENT: Negative; No changes in vision or hearing, sinus congestion, difficulty swallowing Pulmonary: Negative; No cough, wheezing, shortness of breath, hemoptysis Cardiovascular: Negative; No chest pain, presyncope, syncope, palpitations GI: Negative; No nausea, vomiting, diarrhea, or abdominal pain GU: Negative; No dysuria, hematuria, or difficulty voiding Musculoskeletal: Negative; no myalgias, joint pain, or weakness Hematologic/Oncology: Positive for hemochromatosis undergoing monthly phlebotomies; no easy bruising, bleeding Endocrine: Negative; no heat/cold intolerance; no diabetes Neuro: Negative; no changes in balance, headaches Skin: Negative; No rashes or skin lesions Psychiatric: Negative; No behavioral problems, depression Sleep: Positive for sleep apnea on CPAP therapy with 100% compliance.  No snoring, daytime sleepiness, hypersomnolence, bruxism, restless legs, hypnogognic hallucinations, no cataplexy Other comprehensive 14 point system review is negative.    PE BP 122/72 mmHg  Pulse 59  Ht 5\' 11"  (1.803 m)  Wt 193 lb 11.2 oz (87.862 kg)  BMI 27.03 kg/m2   Wt Readings from Last 3 Encounters:  02/23/15 193 lb 11.2 oz (87.862 kg)  08/24/14 205 lb (92.987 kg)  03/05/14 202 lb 4.8 oz (91.763 kg)   General: Alert, oriented, no distress.  Skin: normal turgor, no rashes HEENT: Normocephalic, atraumatic. Pupils round and reactive; sclera anicteric;no lid lag. No xanthelasmas. Nose without nasal septal hypertrophy Mouth/Parynx benign; Mallinpatti scale 3 Neck: No JVD, no carotid bruits with normal carotid upstroke Chest wall: Nontender to palpation Lungs: clear to  ausculatation and percussion; no wheezing or rales Heart: RRR, s1 s2 normal 1/6 systolic murmur; no diastolic murmur. No S3 or S4 gallop. No rubs thrills or heaves. Abdomen: soft, nontender; no hepatosplenomehaly, BS+; abdominal aorta nontender and not dilated by palpation. Back: No CVA tenderness Musculoskeletal: No discomfort Pulses 2+ Extremities: no clubbing cyanosis or edema, Homan's sign negative  Neurologic: grossly nonfocal Psychological: Normal affect and mood  ECG (independently read by me): Sinus bradycardia 59 bpm.  First-degree AV block with a PR interval at 238 ms.  QTc interval 465 ms.  Incomplete right bundle branch block.  March 2016 ECG (independently read by me): NSR at 63 with first-degree AV block.   October 2015 ECG (independently read by me): Sinus bradycardia at 54 beats per minute with first-degree AV block.  Nonspecific T changes.  Prior March 2015 ECG: Sinus bradycardia at 51 per minute. First degree AV block; QTc interval 458 ms  LABS:  BMP Latest Ref Rng 04/22/2009  Glucose 70 - 99  mg/dL 111(H)  BUN 6 - 23 mg/dL 9  Creatinine 0.4 - 1.5 mg/dL 0.78  Sodium 135 - 145 mEq/L 139  Potassium 3.5 - 5.1 mEq/L 4.1  Chloride 96 - 112 mEq/L 101  CO2 19 - 32 mEq/L 31  Calcium 8.4 - 10.5 mg/dL 9.5      Component Value Date/Time   PROT 7.0 07/07/2009 1448   ALBUMIN 4.0 07/07/2009 1448   AST 37 07/07/2009 1448   ALT 36 07/07/2009 1448   ALKPHOS 68 07/07/2009 1448   BILITOT 0.7 07/07/2009 1448   BILIDIR 0.2 07/07/2009 1448    CBC Latest Ref Rng 01/04/2015 10/01/2014 06/22/2014  WBC 4.0 - 10.5 K/uL - - -  Hemoglobin 13.0 - 17.0 g/dL 15.1 15.7 16.4  Hematocrit 39.0 - 52.0 % 43.6 44.6 48.5  Platelets 150.0 - 400.0 K/uL - - -   Lab Results  Component Value Date   MCV 92.9 03/23/2014   MCV 92.4 12/18/2013   MCV 91.6 09/11/2013   Lipid Panel  No results found for: CHOL, TRIG, HDL, CHOLHDL, VLDL, LDLCALC, LDLDIRECT   Recent lab work done on 10/27/2014 by Dr.  Deland Pretty were reviewed: Hemoglobin A1c 5.7.  Hemoglobin 16.5, hematocrit 47.0.  BUN 14, creatinine 1.2.  Normal liver function studies.  Total cluster 183, triglycerides 101, HDL 39, LDL 124.  Urinalysis negative.  RADIOLOGY: No results found.    ASSESSMENT AND PLAN: Mr. Dority a young appearing 81 year old white male with a history of PAF and  is maintaining sinus rhythm on his current medical regimen consisting of propofenone 150 mg every 8 hours in addition to Toprol-XL 25 mg.  He aso takes lisinopril 40 mg and amlodipine 5 mg for blood pressure.  His blood pressure today is normal.  He has not had any breakthrough atrial fibrillation.  He continues to use CPAP 100% of the time and feels that this has made a significant difference in his quality of sleep and quality of life.  Based on his most recent lab work, his creatinine clearance is still greater than 50 and I renewed his Xarelto dose today at 20 mg since he had run out effective today.  He continues to be active.  There is no anemia.  As long as he remains stable, I will see him in one year for reevaluation.  Time spent: 25 minutes  Troy Sine, MD, [facility]  02/25/2015 6:25 PM

## 2015-03-02 DIAGNOSIS — H40003 Preglaucoma, unspecified, bilateral: Secondary | ICD-10-CM | POA: Diagnosis not present

## 2015-03-03 ENCOUNTER — Encounter: Payer: Self-pay | Admitting: Cardiovascular Disease

## 2015-03-08 DIAGNOSIS — H2513 Age-related nuclear cataract, bilateral: Secondary | ICD-10-CM | POA: Diagnosis not present

## 2015-03-08 DIAGNOSIS — H40003 Preglaucoma, unspecified, bilateral: Secondary | ICD-10-CM | POA: Diagnosis not present

## 2015-03-23 DIAGNOSIS — Z23 Encounter for immunization: Secondary | ICD-10-CM | POA: Diagnosis not present

## 2015-04-09 ENCOUNTER — Other Ambulatory Visit (INDEPENDENT_AMBULATORY_CARE_PROVIDER_SITE_OTHER): Payer: Medicare Other

## 2015-04-09 LAB — HEMOGLOBIN: Hemoglobin: 15.3 g/dL (ref 13.0–17.0)

## 2015-04-09 LAB — HEMATOCRIT: HCT: 44.5 % (ref 39.0–52.0)

## 2015-04-19 ENCOUNTER — Other Ambulatory Visit: Payer: Self-pay

## 2015-04-19 ENCOUNTER — Other Ambulatory Visit: Payer: Self-pay | Admitting: Cardiovascular Disease

## 2015-04-19 MED ORDER — PROPAFENONE HCL 150 MG PO TABS: 150.0000 mg | ORAL_TABLET | Freq: Three times a day (TID) | ORAL | Status: DC

## 2015-04-19 NOTE — Telephone Encounter (Signed)
°*  STAT* If patient is at the pharmacy, call can be transferred to refill team.   1. Which medications need to be refilled? (please list name of each medication and dose if known) Propfenone 150mg   2. Which pharmacy/location (including street and city if local pharmacy) is medication to be sent to? [facility] on [address]  3. Do they need a 30 day or 90 day supply? [place]

## 2015-04-19 NOTE — Telephone Encounter (Signed)
Pt's Rx was sent to pt's pharmacy requested. Confirmation received. °

## 2015-07-02 DIAGNOSIS — R351 Nocturia: Secondary | ICD-10-CM | POA: Diagnosis not present

## 2015-07-02 DIAGNOSIS — N401 Enlarged prostate with lower urinary tract symptoms: Secondary | ICD-10-CM | POA: Diagnosis not present

## 2015-07-02 DIAGNOSIS — N138 Other obstructive and reflux uropathy: Secondary | ICD-10-CM | POA: Diagnosis not present

## 2015-07-02 DIAGNOSIS — Z0000 Encounter for general adult medical examination without abnormal findings: Secondary | ICD-10-CM | POA: Diagnosis not present

## 2015-07-19 ENCOUNTER — Other Ambulatory Visit: Payer: Self-pay

## 2015-07-19 ENCOUNTER — Other Ambulatory Visit: Payer: Medicare Other

## 2015-07-19 ENCOUNTER — Other Ambulatory Visit (INDEPENDENT_AMBULATORY_CARE_PROVIDER_SITE_OTHER): Payer: Medicare Other

## 2015-07-19 LAB — HEMOGLOBIN: HEMOGLOBIN: 16.2 g/dL (ref 13.0–17.0)

## 2015-07-19 LAB — HEMATOCRIT: HEMATOCRIT: 47.3 % (ref 39.0–52.0)

## 2015-08-11 DIAGNOSIS — M7021 Olecranon bursitis, right elbow: Secondary | ICD-10-CM | POA: Diagnosis not present

## 2015-08-25 ENCOUNTER — Telehealth: Payer: Self-pay | Admitting: Gastroenterology

## 2015-08-25 NOTE — Telephone Encounter (Signed)
Patient calling to schedule an appt for annual follow up.  I scheduled him for 10/06/15

## 2015-09-22 DIAGNOSIS — E7800 Pure hypercholesterolemia, unspecified: Secondary | ICD-10-CM | POA: Diagnosis not present

## 2015-10-04 ENCOUNTER — Other Ambulatory Visit: Payer: Self-pay

## 2015-10-04 ENCOUNTER — Other Ambulatory Visit (INDEPENDENT_AMBULATORY_CARE_PROVIDER_SITE_OTHER): Payer: Medicare Other

## 2015-10-04 LAB — HEMOGLOBIN: Hemoglobin: 15.3 g/dL (ref 13.0–17.0)

## 2015-10-04 LAB — HEMATOCRIT: HCT: 44.4 % (ref 39.0–52.0)

## 2015-10-04 LAB — IBC PANEL
Iron: 147 ug/dL (ref 42–165)
SATURATION RATIOS: 50.2 % — AB (ref 20.0–50.0)
TRANSFERRIN: 209 mg/dL — AB (ref 212.0–360.0)

## 2015-10-04 LAB — FERRITIN: Ferritin: 53.9 ng/mL (ref 22.0–322.0)

## 2015-10-05 DIAGNOSIS — Z7901 Long term (current) use of anticoagulants: Secondary | ICD-10-CM | POA: Diagnosis not present

## 2015-10-05 DIAGNOSIS — I4891 Unspecified atrial fibrillation: Secondary | ICD-10-CM | POA: Diagnosis not present

## 2015-10-05 DIAGNOSIS — I10 Essential (primary) hypertension: Secondary | ICD-10-CM | POA: Diagnosis not present

## 2015-10-06 ENCOUNTER — Ambulatory Visit: Payer: Medicare Other | Admitting: Gastroenterology

## 2015-10-15 DIAGNOSIS — L918 Other hypertrophic disorders of the skin: Secondary | ICD-10-CM | POA: Diagnosis not present

## 2015-10-15 DIAGNOSIS — L820 Inflamed seborrheic keratosis: Secondary | ICD-10-CM | POA: Diagnosis not present

## 2015-10-15 DIAGNOSIS — Z85828 Personal history of other malignant neoplasm of skin: Secondary | ICD-10-CM | POA: Diagnosis not present

## 2015-10-15 DIAGNOSIS — L570 Actinic keratosis: Secondary | ICD-10-CM | POA: Diagnosis not present

## 2015-11-08 DIAGNOSIS — H2513 Age-related nuclear cataract, bilateral: Secondary | ICD-10-CM | POA: Diagnosis not present

## 2015-11-08 DIAGNOSIS — H40003 Preglaucoma, unspecified, bilateral: Secondary | ICD-10-CM | POA: Diagnosis not present

## 2015-11-09 ENCOUNTER — Other Ambulatory Visit: Payer: Self-pay

## 2015-11-09 MED ORDER — PROPAFENONE HCL 150 MG PO TABS: 150.0000 mg | ORAL_TABLET | Freq: Three times a day (TID) | ORAL | Status: DC

## 2016-01-06 ENCOUNTER — Other Ambulatory Visit: Payer: Medicare Other

## 2016-01-06 ENCOUNTER — Other Ambulatory Visit (INDEPENDENT_AMBULATORY_CARE_PROVIDER_SITE_OTHER): Payer: Medicare Other

## 2016-01-06 LAB — HEMATOCRIT: HEMATOCRIT: 45.7 % (ref 39.0–52.0)

## 2016-01-06 LAB — HEMOGLOBIN: HEMOGLOBIN: 15.9 g/dL (ref 13.0–17.0)

## 2016-01-07 ENCOUNTER — Other Ambulatory Visit: Payer: Self-pay

## 2016-01-10 ENCOUNTER — Other Ambulatory Visit: Payer: Self-pay | Admitting: Cardiovascular Disease

## 2016-01-10 MED ORDER — RIVAROXABAN 20 MG PO TABS: ORAL_TABLET | ORAL | 3 refills | Status: DC

## 2016-02-01 ENCOUNTER — Telehealth: Payer: Self-pay | Admitting: Cardiovascular Disease

## 2016-02-01 NOTE — Telephone Encounter (Signed)
Returned call to patient. Discussed blood thinners. Have made patient aware that other NOAC options are usually comparable in cost, coumadin is an option also. Pt voiced understanding, notes he will continue to take Xarelto, will follow up on this w/ Dr. Claiborne Billings at September appt.

## 2016-02-01 NOTE — Telephone Encounter (Signed)
New Message  Pt c/o medication issue:  1. Name of Medication: Xarelto  2. How are you currently taking this medication (dosage and times per day)? 20 mg tablets 1 daily  3. Are you having a reaction (difficulty breathing--STAT)? No  4. What is your medication issue? Pt voiced he wants to ask about his blood thinners.  Pt voiced he's been in rhythm for a long time and wonders if he needs to continue to take this medication.  Pt voiced this medication is expensive also.  Please follow up with pt. Thanks!

## 2016-02-11 ENCOUNTER — Other Ambulatory Visit: Payer: Self-pay | Admitting: Cardiovascular Disease

## 2016-03-02 ENCOUNTER — Encounter: Payer: Self-pay | Admitting: Cardiovascular Disease

## 2016-03-02 ENCOUNTER — Ambulatory Visit (INDEPENDENT_AMBULATORY_CARE_PROVIDER_SITE_OTHER): Payer: Medicare Other | Admitting: Cardiovascular Disease

## 2016-03-02 VITALS — BP 154/84 | Ht 71.0 in | Wt 202.1 lb

## 2016-03-02 DIAGNOSIS — I480 Paroxysmal atrial fibrillation: Secondary | ICD-10-CM

## 2016-03-02 DIAGNOSIS — G4733 Obstructive sleep apnea (adult) (pediatric): Secondary | ICD-10-CM | POA: Diagnosis not present

## 2016-03-02 DIAGNOSIS — I10 Essential (primary) hypertension: Secondary | ICD-10-CM

## 2016-03-02 MED ORDER — PROPAFENONE HCL 150 MG PO TABS: 150.0000 mg | ORAL_TABLET | Freq: Three times a day (TID) | ORAL | 3 refills | Status: DC

## 2016-03-02 NOTE — Patient Instructions (Signed)
Medication Instructions:  Continue current medications  Labwork: None Ordered  Testing/Procedures: None Ordered  Follow-Up: Your physician wants you to follow-up in: 6 Months. You will receive a reminder letter in the mail two months in advance. If you don't receive a letter, please call our office to schedule the follow-up appointment.  Your physician recommends that you schedule a follow-up appointment in: 1 Weeks for EKG check   Any Other Special Instructions Will Be Listed Below (If Applicable).   If you need a refill on your cardiac medications before your next appointment, please call your pharmacy.

## 2016-03-04 NOTE — Progress Notes (Signed)
Patient ID: Andrew [name], male   DOB: 10-11-1930, 82 y.o.   MRN: [id number]    HPI: Andrew [name] is a 82 y.o. male who presents for a 72 month cardiology evaluation.  Andrew [name] has a history of paroxysmal atrial fibrillation. He was initially found to be in atrial fibrillation  in February 2013 and was placed on Xarelto and beta blocker therapy. He developed recurrent atrial fibrillation after his initial cardioversion. He was found to have obstructive sleep apnea and prior to a second cardioversion CPAP/BiPAP was instituted. He has been on BiPAP therapy ever since. His second cardioversion June 2013 was successful and with treatment of his sleep apnea and propofenone/ toprol he has maintained sinus rhythm. His DME company is [employer].  Since I last saw him he is unaware of any breakthrough atrial fibrillation or tachycardic spells. He uses his BiPAP with 100% compliance. Typically he goes to bed approximately 8 PM and wakes up between 5:30 and 6 AM. His sleep is restorative. He is unaware breakthrough snoring. There is no residual daytime sleepiness.  He has a history of hemachromatosis and has phlebotomies every 3 months.. He denies chest pain. He denies palpitations. He has a history of hypertension and has been on amlodipine 5 mg, lisinopril 40 mg, Toprol XL 25 mg. He states that his BP at home typically in the [id number] systolic.   Andrew [name] now resides at [facility] in independent living.  He is eating better.  Over the past year, he continues to do well.  He denies chest pain.  He denies PND, orthopnea.  He is maintaining active and in the silver sneaker program at least 3 days per week and swims at least 2 days per week.  He is using CPAP with 100% compliance and typically sleeps 8-9 hours per night.  His sleep is restful.  He denies residual daytime sleepiness or any breakthrough snoring.  He denies bleeding on Xarelto.  He  had laboratory by Andrew [name] which I have reviewed from  09/22/2015.  He presents for evaluation  Past Medical History:  Diagnosis Date  . Adenomatous colon polyp 10/1993  . Diverticulosis   . 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 Hepatitis B antibody positive   . Hypertension   . OSA treated with BiPAP   . PAF (paroxysmal atrial fibrillation) ([place])     Past Surgical History:  Procedure Laterality Date  . CARDIOVERSION  08/18/2011   Procedure: CARDIOVERSION;  Surgeon: Troy Sine, MD;  Location: [place];  Service: Cardiovascular;  Laterality: N/A;  . CARDIOVERSION  11/22/2011   Procedure: CARDIOVERSION;  Surgeon: Troy Sine, MD;  Location: [place];  Service: Cardiovascular;  Laterality: N/A;  . COLONOSCOPY    . KNEE ARTHROSCOPY    . LAPAROSCOPIC CHOLECYSTECTOMY  04/2009  . NM MYOCAR PERF WALL MOTION  05/08/2008   Normal  . TONSILLECTOMY    . US ECHOCARDIOGRAPHY  07/27/2011   Mild concentric LVH, LA & RA is moderately dilated, mild to mod MR.    No Known Allergies  Current Outpatient Prescriptions  Medication Sig Dispense Refill  . amLODipine (NORVASC) 5 MG tablet Take 5 mg by mouth daily.    [name] Kitchen lisinopril (PRINIVIL,ZESTRIL) 40 MG tablet Take 40 mg by mouth daily.    . metoprolol succinate (TOPROL-XL) 25 MG 24 hr tablet Take 25 mg by mouth daily.    . propafenone (RYTHMOL) 150 MG tablet Take 1 tablet (  150 mg total) by mouth every 8 (eight) hours. 270 tablet 3  . rivaroxaban (XARELTO) 20 MG TABS tablet TAKE 1 TABLET BY MOUTH DAILY WITH SUPPER 90 tablet 3  . Tamsulosin HCl (FLOMAX) 0.4 MG CAPS Take 0.4 mg by mouth daily.     No current facility-administered medications for this visit.     Social History   Social History  . Marital status: Married    Spouse name: N/A  . Number of children: N/A  . Years of education: N/A   Occupational History  . Retired Retired   Social History Main Topics  . Smoking status: Former Research scientist (life sciences)  . Smokeless tobacco: Never Used  . Alcohol use 0.0 oz/week      Comment: 2 glasses if wine daily  . Drug use: No  . Sexual activity: Not on file   Other Topics Concern  . Not on file   Social History Narrative  . No narrative on file    Family History  Problem Relation Age of Onset  . Esophageal cancer Father   . Emphysema Mother   . Stroke Andrew [name], he is retired. He is the owner of [employer]. He is married and has 3 children and 9 grandchildren. He does exercise. No tobacco use. He does drink occasional alcohol.  ROS General: Negative; No fevers, chills, or night sweats; positive for fatigue HEENT: Negative; No changes in vision or hearing, sinus congestion, difficulty swallowing Pulmonary: Negative; No cough, wheezing, shortness of breath, hemoptysis Cardiovascular: Negative; No chest pain, presyncope, syncope, palpitations GI: Negative; No nausea, vomiting, diarrhea, or abdominal pain GU: Negative; No dysuria, hematuria, or difficulty voiding Musculoskeletal: Negative; no myalgias, joint pain, or weakness Hematologic/Oncology: Positive for hemochromatosis undergoing monthly phlebotomies; no easy bruising, bleeding Endocrine: Negative; no heat/cold intolerance; no diabetes Neuro: Negative; no changes in balance, headaches Skin: Negative; No rashes or skin lesions Psychiatric: Negative; No behavioral problems, depression Sleep: Positive for sleep apnea on CPAP therapy with 100% compliance.  No snoring, daytime sleepiness, hypersomnolence, bruxism, restless legs, hypnogognic hallucinations, no cataplexy Other comprehensive 14 point system review is negative.    PE BP (!) 154/84 (BP Location: Left Arm, Patient Position: Sitting, Cuff Size: Normal)   Ht 5\' 11"  (1.803 m)   Wt 202 lb 2 oz (91.7 kg)   BMI 28.19 kg/m    Repeat blood pressure 142/80  Wt Readings from Last 3 Encounters:  03/02/16 202 lb 2 oz (91.7 kg)  02/23/15 193 lb 11.2 oz (87.9 kg)  08/24/14 205 lb (93 kg)   General: Alert, oriented, no distress.    Skin: normal turgor, no rashes HEENT: Normocephalic, atraumatic. Pupils round and reactive; sclera anicteric;no lid lag. No xanthelasmas. Nose without nasal septal hypertrophy Mouth/Parynx benign; Mallinpatti scale 3 Neck: No JVD, no carotid bruits with normal carotid upstroke Chest wall: Nontender to palpation Lungs: clear to ausculatation and percussion; no wheezing or rales Heart: RRR, s1 s2 normal 1/6 systolic murmur; no diastolic murmur. No S3 or S4 gallop. No rubs thrills or heaves. Abdomen: soft, nontender; no hepatosplenomehaly, BS+; abdominal aorta nontender and not dilated by palpation. Back: No CVA tenderness Musculoskeletal: No discomfort Pulses 2+ Extremities: no clubbing cyanosis or edema, Homan's sign negative  Neurologic: grossly nonfocal Psychological: Normal affect and mood  ECG (independently read by me): Normal sinus rhythm at 61 bpm.  First-degree AV block with a PR interval of 216 ms.  Nonspecific T changes.  September 2016 ECG (independently read by me): Sinus bradycardia 59  bpm.  First-degree AV block with a PR interval at 238 ms.  QTc interval 465 ms.  Incomplete right bundle branch block.  March 2016 ECG (independently read by me): NSR at 63 with first-degree AV block.   October 2015 ECG (independently read by me): Sinus bradycardia at 54 beats per minute with first-degree AV block.  Nonspecific T changes.  Prior March 2015 ECG: Sinus bradycardia at 51 per minute. First degree AV block; QTc interval 458 ms  LABS: I personally reviewed the blood work from Dr. Pennie Banter office on 09/22/2015. Hemoglobin 16.1./Hematocrit 46.3/platelets 171, BUN 12, creatinine 0.9.  Serum 4.2.  LFTs normal. Cholesterol 193, triglycerides 92, HDL 47, LDL 128  BMP Latest Ref Rng & Units 04/22/2009  Glucose 70 - 99 mg/dL 111(H)  BUN 6 - 23 mg/dL 9  Creatinine 0.4 - 1.5 mg/dL 0.78  Sodium 135 - 145 mEq/L 139  Potassium 3.5 - 5.1 mEq/L 4.1  Chloride 96 - 112 mEq/L 101  CO2 19 -  32 mEq/L 31  Calcium 8.4 - 10.5 mg/dL 9.5  Some recent data might be hidden      Component Value Date/Time   PROT 7.0 07/07/2009 1448   ALBUMIN 4.0 07/07/2009 1448   AST 37 07/07/2009 1448   ALT 36 07/07/2009 1448   ALKPHOS 68 07/07/2009 1448   BILITOT 0.7 07/07/2009 1448   BILIDIR 0.2 07/07/2009 1448    CBC Latest Ref Rng & Units 01/06/2016 10/04/2015 07/19/2015  WBC 4.0 - 10.5 K/uL - - -  Hemoglobin 13.0 - 17.0 g/dL 15.9 15.3 16.2  Hematocrit 39.0 - 52.0 % 45.7 44.4 47.3  Platelets 150.0 - 400.0 K/uL - - -  Some recent data might be hidden   Lab Results  Component Value Date   MCV 92.9 03/23/2014   MCV 92.4 12/18/2013   MCV 91.6 09/11/2013   Lipid Panel  No results found for: CHOL, TRIG, HDL, CHOLHDL, VLDL, LDLCALC, LDLDIRECT   Recent lab work done on 10/27/2014 by Dr. Deland Pretty were reviewed: Hemoglobin A1c 5.7.  Hemoglobin 16.5, hematocrit 47.0.  BUN 14, creatinine 1.2.  Normal liver function studies.  Total cluster 183, triglycerides 101, HDL 39, LDL 124.  Urinalysis negative.  RADIOLOGY: No results found.    ASSESSMENT AND PLAN: Mr. Schlender a young appearing 82 year old white male with a history of PAF and  is maintaining sinus rhythm on his current medical regimen consisting of propofenone 150 mg every 8 hours in addition to Toprol-XL 25 mg. He had run out of propefenone and we had called in a prescription, which he inadvertently never picked up.  ECG tody continues to show sinus rhythm with a QTc interval of 446 ms.  He aso takes lisinopril 40 mg and amlodipine 5 mg for blood pressure.  His blood pressure today is normal.  He has not had any breakthrough atrial fibrillation.  I have recommended resumption of propofenone.  In one week.  He will return for follow-up ECG to make certain reinstitution has not resulted in a significant increase in the QTc interval. He continues to use CPAP 100% of the time and feels that this has made a significant difference in his quality of  sleep and quality of life.  He has not had recurrent atrial fibrillation since initiating CPAP therapy.  Based on his most recent lab work, his creatinine clearance is still greater than 50 and he continues to be on Xarelto 20 mg .  I commended him on his frequent exercise.  Blood  work from April did show an LDL at 128.  I discussed improve diet.  Lab work will be repeated by Andrew [name].  I will see him in 6 months for follow-up evaluation  Time spent: 25 minutes  Troy Sine, MD, [facility]  03/04/2016 4:22 PM

## 2016-03-09 ENCOUNTER — Ambulatory Visit (INDEPENDENT_AMBULATORY_CARE_PROVIDER_SITE_OTHER): Payer: Medicare Other

## 2016-03-09 VITALS — BP 120/82 | HR 48 | Ht 71.0 in | Wt 202.5 lb

## 2016-03-09 DIAGNOSIS — I480 Paroxysmal atrial fibrillation: Secondary | ICD-10-CM

## 2016-03-09 NOTE — Patient Instructions (Addendum)
1.) Reason for visit: EKG  2.) Name of MD requesting visit: Dr.Kelly  3.) H&P: Patient restarted Rythmol 150 mg one week ago.Dr.Kelly advised to have EKG in 1 week to check QTC interval.  4.) ROS related to problem: No complaints  5.) Assessment and plan per MD: DOD Dr.Hilty reviewed EKG.sinus brady rate 48, with normal QTC interval.Advised to continue same medications.Keep appointment with [facility] as planned.

## 2016-03-16 DIAGNOSIS — Z23 Encounter for immunization: Secondary | ICD-10-CM | POA: Diagnosis not present

## 2016-04-10 ENCOUNTER — Other Ambulatory Visit (INDEPENDENT_AMBULATORY_CARE_PROVIDER_SITE_OTHER): Payer: Medicare Other

## 2016-04-10 LAB — HEMATOCRIT: HCT: 44.7 % (ref 39.0–52.0)

## 2016-04-10 LAB — IBC PANEL
IRON: 128 ug/dL (ref 42–165)
Saturation Ratios: 47.4 % (ref 20.0–50.0)
TRANSFERRIN: 193 mg/dL — AB (ref 212.0–360.0)

## 2016-04-10 LAB — HEMOGLOBIN: HEMOGLOBIN: 15.5 g/dL (ref 13.0–17.0)

## 2016-04-10 LAB — FERRITIN: FERRITIN: 102.3 ng/mL (ref 22.0–322.0)

## 2016-05-04 DIAGNOSIS — D225 Melanocytic nevi of trunk: Secondary | ICD-10-CM | POA: Diagnosis not present

## 2016-05-04 DIAGNOSIS — L820 Inflamed seborrheic keratosis: Secondary | ICD-10-CM | POA: Diagnosis not present

## 2016-05-04 DIAGNOSIS — D224 Melanocytic nevi of scalp and neck: Secondary | ICD-10-CM | POA: Diagnosis not present

## 2016-05-04 DIAGNOSIS — D1801 Hemangioma of skin and subcutaneous tissue: Secondary | ICD-10-CM | POA: Diagnosis not present

## 2016-05-04 DIAGNOSIS — D2261 Melanocytic nevi of right upper limb, including shoulder: Secondary | ICD-10-CM | POA: Diagnosis not present

## 2016-05-04 DIAGNOSIS — Z85828 Personal history of other malignant neoplasm of skin: Secondary | ICD-10-CM | POA: Diagnosis not present

## 2016-05-04 DIAGNOSIS — L814 Other melanin hyperpigmentation: Secondary | ICD-10-CM | POA: Diagnosis not present

## 2016-05-04 DIAGNOSIS — D2271 Melanocytic nevi of right lower limb, including hip: Secondary | ICD-10-CM | POA: Diagnosis not present

## 2016-05-04 DIAGNOSIS — L817 Pigmented purpuric dermatosis: Secondary | ICD-10-CM | POA: Diagnosis not present

## 2016-05-04 DIAGNOSIS — L570 Actinic keratosis: Secondary | ICD-10-CM | POA: Diagnosis not present

## 2016-05-04 DIAGNOSIS — L821 Other seborrheic keratosis: Secondary | ICD-10-CM | POA: Diagnosis not present

## 2016-07-03 DIAGNOSIS — N401 Enlarged prostate with lower urinary tract symptoms: Secondary | ICD-10-CM | POA: Diagnosis not present

## 2016-07-03 DIAGNOSIS — R350 Frequency of micturition: Secondary | ICD-10-CM | POA: Diagnosis not present

## 2016-07-20 ENCOUNTER — Other Ambulatory Visit (INDEPENDENT_AMBULATORY_CARE_PROVIDER_SITE_OTHER): Payer: Medicare Other

## 2016-07-20 LAB — HEMATOCRIT: HEMATOCRIT: 45.2 % (ref 39.0–52.0)

## 2016-07-20 LAB — HEMOGLOBIN: HEMOGLOBIN: 16 g/dL (ref 13.0–17.0)

## 2016-07-25 ENCOUNTER — Other Ambulatory Visit: Payer: Self-pay

## 2016-08-30 ENCOUNTER — Encounter: Payer: Self-pay | Admitting: Cardiovascular Disease

## 2016-08-30 ENCOUNTER — Ambulatory Visit (INDEPENDENT_AMBULATORY_CARE_PROVIDER_SITE_OTHER): Payer: Medicare Other | Admitting: Cardiovascular Disease

## 2016-08-30 VITALS — BP 129/77 | HR 53 | Ht 71.0 in | Wt 199.8 lb

## 2016-08-30 DIAGNOSIS — I10 Essential (primary) hypertension: Secondary | ICD-10-CM

## 2016-08-30 DIAGNOSIS — Z9989 Dependence on other enabling machines and devices: Secondary | ICD-10-CM | POA: Diagnosis not present

## 2016-08-30 DIAGNOSIS — Z7901 Long term (current) use of anticoagulants: Secondary | ICD-10-CM | POA: Diagnosis not present

## 2016-08-30 DIAGNOSIS — G4733 Obstructive sleep apnea (adult) (pediatric): Secondary | ICD-10-CM | POA: Diagnosis not present

## 2016-08-30 DIAGNOSIS — I480 Paroxysmal atrial fibrillation: Secondary | ICD-10-CM | POA: Diagnosis not present

## 2016-08-30 NOTE — Patient Instructions (Signed)
Your physician wants you to follow-up in: October  You will receive a reminder letter in the mail two months in advance. If you don't receive a letter, please call our office to schedule the follow-up appointment.   If you need a refill on your cardiac medications before your next appointment, please call your pharmacy.

## 2016-08-30 NOTE — Progress Notes (Signed)
Patient ID: Andrew [name], male   DOB: [birth year], 83 y.o.   MRN: [id number]    HPI: Andrew [name] is a 83 y.o. male who presents for a 6 month cardiology evaluation.  Andrew [name] has a history of paroxysmal atrial fibrillation. He was initially found to be in atrial fibrillation  in February 2013 and was placed on Xarelto and beta blocker therapy. He developed recurrent atrial fibrillation after his initial cardioversion. He was found to have obstructive sleep apnea and prior to a second cardioversion CPAP/BiPAP was instituted. He has been on BiPAP therapy ever since. His second cardioversion June 2013 was successful and with treatment of his sleep apnea and propofenone/ toprol he has maintained sinus rhythm. His DME company is [employer].  Since I last saw him he is unaware of any breakthrough atrial fibrillation or tachycardic spells. He uses his BiPAP with 100% compliance. Typically he goes to bed approximately 8 PM and wakes up between 5:30 and 6 AM. His sleep is restorative. He is unaware breakthrough snoring. There is no residual daytime sleepiness.  He has a history of hemachromatosis and has phlebotomies every 3 months.. He denies chest pain. He denies palpitations. He has a history of hypertension and has been on amlodipine 5 mg, lisinopril 40 mg, Toprol XL 25 mg. He states that his BP at home typically in the 563'O systolic.   Andrew [name]  resides at [facility] in independent living.  He is eating better, and swims at least 2-3 times per week and does exercise classes.  Over the past year, he continues to do well.  He denies chest pain.  He denies PND, orthopnea.  He is using CPAP with 100% compliance and typically sleeps 8-9 hours per night.  His sleep is restful.  He denies residual daytime sleepiness or any breakthrough snoring.  He denies bleeding on Xarelto.  Laboratory has been done by Dr. Shelia Media.  He presents for a  f/u evaluation.  Past Medical History:  Diagnosis Date    . Adenomatous colon polyp 10/1993  . Diverticulosis   . 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 Hepatitis B antibody positive   . Hypertension   . OSA treated with BiPAP   . PAF (paroxysmal atrial fibrillation) ([place])     Past Surgical History:  Procedure Laterality Date  . CARDIOVERSION  08/18/2011   Procedure: CARDIOVERSION;  Surgeon: Troy Sine, MD;  Location: [place];  Service: Cardiovascular;  Laterality: N/A;  . CARDIOVERSION  11/22/2011   Procedure: CARDIOVERSION;  Surgeon: Troy Sine, MD;  Location: [place];  Service: Cardiovascular;  Laterality: N/A;  . COLONOSCOPY    . KNEE ARTHROSCOPY    . LAPAROSCOPIC CHOLECYSTECTOMY  04/2009  . NM MYOCAR PERF WALL MOTION  05/08/2008   Normal  . TONSILLECTOMY    . US ECHOCARDIOGRAPHY  07/27/2011   Mild concentric LVH, LA & RA is moderately dilated, mild to mod MR.    No Known Allergies  Current Outpatient Prescriptions  Medication Sig Dispense Refill  . amLODipine (NORVASC) 5 MG tablet Take 5 mg by mouth daily.    [name] Kitchen lisinopril (PRINIVIL,ZESTRIL) 40 MG tablet Take 40 mg by mouth daily.    . metoprolol succinate (TOPROL-XL) 25 MG 24 hr tablet Take 25 mg by mouth daily.    . propafenone (RYTHMOL) 150 MG tablet Take 1 tablet (150 mg total) by mouth every 8 (eight) hours. 270 tablet 3  . rivaroxaban (  XARELTO) 20 MG TABS tablet TAKE 1 TABLET BY MOUTH DAILY WITH SUPPER 90 tablet 3  . Tamsulosin HCl (FLOMAX) 0.4 MG CAPS Take 0.4 mg by mouth daily.     No current facility-administered medications for this visit.     Social History   Social History  . Marital status: Married    Spouse name: N/A  . Number of children: N/A  . Years of education: N/A   Occupational History  . Retired Retired   Social History Main Topics  . Smoking status: Former Research scientist (life sciences)  . Smokeless tobacco: Never Used  . Alcohol use 0.0 oz/week     Comment: 2 glasses if wine daily  . Drug use: No  . Sexual activity: Not on file    Other Topics Concern  . Not on file   Social History Narrative  . No narrative on file    Family History  Problem Relation Age of Onset  . Esophageal cancer Father   . Emphysema Mother   . 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 He does exercise. No tobacco use. He does drink occasional alcohol.  ROS General: Negative; No fevers, chills, or night sweats; positive for fatigue HEENT: Negative; No changes in vision or hearing, sinus congestion, difficulty swallowing Pulmonary: Negative; No cough, wheezing, shortness of breath, hemoptysis Cardiovascular: Negative; No chest pain, presyncope, syncope, palpitations GI: Negative; No nausea, vomiting, diarrhea, or abdominal pain GU: Negative; No dysuria, hematuria, or difficulty voiding Musculoskeletal: Negative; no myalgias, joint pain, or weakness Hematologic/Oncology: Positive for hemochromatosis undergoing monthly phlebotomies; no easy bruising, bleeding Endocrine: Negative; no heat/cold intolerance; no diabetes Neuro: Negative; no changes in balance, headaches Skin: Negative; No rashes or skin lesions Psychiatric: Negative; No behavioral problems, depression Sleep: Positive for sleep apnea on CPAP therapy with 100% compliance.  No snoring, daytime sleepiness, hypersomnolence, bruxism, restless legs, hypnogognic hallucinations, no cataplexy Other comprehensive 14 point system review is negative.    PE BP 129/77   Pulse (!) 53   Ht 5\' 11"  (1.803 m)   Wt 199 lb 12.8 oz (90.6 kg)   BMI 27.87 kg/m    Repeat blood pressure 122/74  Wt Readings from Last 3 Encounters:  08/30/16 199 lb 12.8 oz (90.6 kg)  03/09/16 202 lb 8 oz (91.9 kg)  03/02/16 202 lb 2 oz (91.7 kg)   General: Alert, oriented, no distress.  Skin: normal turgor, no rashes HEENT: Normocephalic, atraumatic. Pupils round and reactive; sclera anicteric;no lid lag. No  xanthelasmas. Nose without nasal septal hypertrophy Mouth/Parynx benign; Mallinpatti scale 3 Neck: No JVD, no carotid bruits with normal carotid upstroke Chest wall: Nontender to palpation Lungs: clear to ausculatation and percussion; no wheezing or rales Heart: RRR, s1 s2 normal 1/6 systolic murmur; no diastolic murmur. No S3 or S4 gallop. No rubs thrills or heaves. Abdomen: soft, nontender; no hepatosplenomehaly, BS+; abdominal aorta nontender and not dilated by palpation. Back: No CVA tenderness Musculoskeletal: No discomfort Pulses 2+ Extremities: no clubbing cyanosis or edema, Homan's sign negative  Neurologic: grossly nonfocal Psychological: Normal affect and mood  ECG (independently read by me): Sinus bradycardia 53 bpm.  First-degree AV block with a PR interval at 222 ms.  QTc interval 452 ms.  No signal ST-T changes.  September 2017 ECG (independently read by me): Normal sinus rhythm at 61 bpm.  First-degree AV block with a PR interval of 216 ms.  Nonspecific  T changes.  September 2016 ECG (independently read by me): Sinus bradycardia 59 bpm.  First-degree AV block with a PR interval at 238 ms.  QTc interval 465 ms.  Incomplete right bundle branch block.  March 2016 ECG (independently read by me): NSR at 63 with first-degree AV block.   October 2015 ECG (independently read by me): Sinus bradycardia at 54 beats per minute with first-degree AV block.  Nonspecific T changes.  Prior March 2015 ECG: Sinus bradycardia at 51 per minute. First degree AV block; QTc interval 458 ms  LABS: I reviewed the blood work from Dr. Pennie Banter office on 09/22/2015. Hemoglobin 16.1./Hematocrit 46.3/platelets 171, BUN 12, creatinine 0.9.  Serum 4.2.  LFTs normal. Cholesterol 193, triglycerides 92, HDL 47, LDL 128  BMP Latest Ref Rng & Units 04/22/2009  Glucose 70 - 99 mg/dL 111(H)  BUN 6 - 23 mg/dL 9  Creatinine 0.4 - 1.5 mg/dL 0.78  Sodium 135 - 145 mEq/L 139  Potassium 3.5 - 5.1 mEq/L 4.1    Chloride 96 - 112 mEq/L 101  CO2 19 - 32 mEq/L 31  Calcium 8.4 - 10.5 mg/dL 9.5      Component Value Date/Time   PROT 7.0 07/07/2009 1448   ALBUMIN 4.0 07/07/2009 1448   AST 37 07/07/2009 1448   ALT 36 07/07/2009 1448   ALKPHOS 68 07/07/2009 1448   BILITOT 0.7 07/07/2009 1448   BILIDIR 0.2 07/07/2009 1448    CBC Latest Ref Rng & Units 07/20/2016 04/10/2016 01/06/2016  WBC 4.0 - 10.5 K/uL - - -  Hemoglobin 13.0 - 17.0 g/dL 16.0 15.5 15.9  Hematocrit 39.0 - 52.0 % 45.2 44.7 45.7  Platelets 150.0 - 400.0 K/uL - - -   Lab Results  Component Value Date   MCV 92.9 03/23/2014   MCV 92.4 12/18/2013   MCV 91.6 09/11/2013   Lipid Panel  No results found for: CHOL, TRIG, HDL, CHOLHDL, VLDL, LDLCALC, LDLDIRECT   Lab work done on 10/27/2014 by Dr. Deland Pretty were reviewed: Hemoglobin A1c 5.7.  Hemoglobin 16.5, hematocrit 47.0.  BUN 14, creatinine 1.2.  Normal liver function studies.  Total cluster 183, triglycerides 101, HDL 39, LDL 124.  Urinalysis negative.  RADIOLOGY: No results found.  IMPRESSION:  1. PAF (paroxysmal atrial fibrillation) ([place])   2. Essential hypertension   3. OSA on CPAP   4. Anticoagulation adequate     ASSESSMENT AND PLAN: Mr. Prabhakar a young appearing 83 year old white male with a history of PAF and is maintaining sinus rhythm on his current medical regimen consisting of propofenone 150 mg every 8 hours in addition to Toprol-XL 25 mg.  He  takes lisinopril 40 mg and amlodipine 5 mg for blood pressure.  His blood pressure today is normal.  He has not had any breakthrough atrial fibrillation.  His ECG today shows sinus bradycardia which is asymptomatic.  QTc interval is normal.. He continues to use CPAP 100% of the time and feels that this has made a significant difference in his quality of sleep and quality of life.  He has not had recurrent atrial fibrillation since initiating CPAP therapy.  Based on his lab work, his creatinine clearance is greater than 50 and  he continues to be on Xarelto 20 mg.  He will be having repeat laboratory with Dr. Shelia Media, and I will need to review this.  He is not having any bleeding.  Long as he remains stable, I will see him in 6 months for reevaluation.   Keeghan Mcintire A.  Claiborne Billings, MD, [facility]  09/01/2016 3:31 PM

## 2016-10-04 DIAGNOSIS — Z0000 Encounter for general adult medical examination without abnormal findings: Secondary | ICD-10-CM | POA: Diagnosis not present

## 2016-10-04 DIAGNOSIS — E785 Hyperlipidemia, unspecified: Secondary | ICD-10-CM | POA: Diagnosis not present

## 2016-10-04 DIAGNOSIS — I10 Essential (primary) hypertension: Secondary | ICD-10-CM | POA: Diagnosis not present

## 2016-10-11 DIAGNOSIS — Z1212 Encounter for screening for malignant neoplasm of rectum: Secondary | ICD-10-CM | POA: Diagnosis not present

## 2016-10-11 DIAGNOSIS — K5730 Diverticulosis of large intestine without perforation or abscess without bleeding: Secondary | ICD-10-CM | POA: Diagnosis not present

## 2016-10-11 DIAGNOSIS — G4733 Obstructive sleep apnea (adult) (pediatric): Secondary | ICD-10-CM | POA: Diagnosis not present

## 2016-10-11 DIAGNOSIS — Z862 Personal history of diseases of the blood and blood-forming organs and certain disorders involving the immune mechanism: Secondary | ICD-10-CM | POA: Diagnosis not present

## 2016-10-11 DIAGNOSIS — Z86010 Personal history of colonic polyps: Secondary | ICD-10-CM | POA: Diagnosis not present

## 2016-10-13 DIAGNOSIS — H40013 Open angle with borderline findings, low risk, bilateral: Secondary | ICD-10-CM | POA: Diagnosis not present

## 2016-10-20 ENCOUNTER — Other Ambulatory Visit: Payer: Medicare Other

## 2016-10-20 ENCOUNTER — Other Ambulatory Visit (INDEPENDENT_AMBULATORY_CARE_PROVIDER_SITE_OTHER): Payer: Medicare Other

## 2016-10-20 ENCOUNTER — Telehealth: Payer: Self-pay | Admitting: Gastroenterology

## 2016-10-20 ENCOUNTER — Other Ambulatory Visit: Payer: Self-pay

## 2016-10-20 LAB — HEMOGLOBIN: Hemoglobin: 16.1 g/dL (ref 13.0–17.0)

## 2016-10-20 LAB — HEMATOCRIT: HCT: 45.8 % (ref 39.0–52.0)

## 2016-10-20 LAB — IBC PANEL
IRON: 125 ug/dL (ref 42–165)
Saturation Ratios: 46.5 % (ref 20.0–50.0)
Transferrin: 192 mg/dL — ABNORMAL LOW (ref 212.0–360.0)

## 2016-10-20 LAB — FERRITIN: Ferritin: 85.5 ng/mL (ref 22.0–322.0)

## 2016-10-20 NOTE — Telephone Encounter (Signed)
Patient notified he is due for labs as well as phlebotomy. He will come at his convenience

## 2016-10-23 ENCOUNTER — Other Ambulatory Visit: Payer: Self-pay

## 2016-10-23 ENCOUNTER — Telehealth: Payer: Self-pay

## 2016-10-23 NOTE — Telephone Encounter (Signed)
Patient notified

## 2016-10-23 NOTE — Telephone Encounter (Signed)
-----   Message from Ladene Artist, MD sent at 10/23/2016 12:12 PM EDT ----- Ferritin trend is slightly increased however saturations remain controlled, below 50%  Continue 6 month phlebotomies with CBC, Fe, TIBC and ferritin

## 2016-11-13 DIAGNOSIS — H2513 Age-related nuclear cataract, bilateral: Secondary | ICD-10-CM | POA: Diagnosis not present

## 2016-11-13 DIAGNOSIS — H40003 Preglaucoma, unspecified, bilateral: Secondary | ICD-10-CM | POA: Diagnosis not present

## 2017-01-17 ENCOUNTER — Other Ambulatory Visit: Payer: Self-pay | Admitting: Cardiovascular Disease

## 2017-01-17 NOTE — Telephone Encounter (Signed)
[place] for most recent metabolic panel

## 2017-01-26 ENCOUNTER — Other Ambulatory Visit: Payer: Medicare Other

## 2017-01-26 ENCOUNTER — Other Ambulatory Visit (INDEPENDENT_AMBULATORY_CARE_PROVIDER_SITE_OTHER): Payer: Medicare Other

## 2017-01-26 LAB — CBC WITH DIFFERENTIAL/PLATELET
BASOS PCT: 0.6 % (ref 0.0–3.0)
Basophils Absolute: 0 10*3/uL (ref 0.0–0.1)
EOS ABS: 0.1 10*3/uL (ref 0.0–0.7)
Eosinophils Relative: 2.1 % (ref 0.0–5.0)
HEMATOCRIT: 45.9 % (ref 39.0–52.0)
Hemoglobin: 15.8 g/dL (ref 13.0–17.0)
Lymphocytes Relative: 21.2 % (ref 12.0–46.0)
Lymphs Abs: 1.3 10*3/uL (ref 0.7–4.0)
MCHC: 34.5 g/dL (ref 30.0–36.0)
MCV: 93.7 fl (ref 78.0–100.0)
Monocytes Absolute: 0.4 10*3/uL (ref 0.1–1.0)
Monocytes Relative: 6.8 % (ref 3.0–12.0)
NEUTROS ABS: 4.4 10*3/uL (ref 1.4–7.7)
Neutrophils Relative %: 69.3 % (ref 43.0–77.0)
PLATELETS: 167 10*3/uL (ref 150.0–400.0)
RBC: 4.9 Mil/uL (ref 4.22–5.81)
RDW: 12.8 % (ref 11.5–15.5)
WBC: 6.3 10*3/uL (ref 4.0–10.5)

## 2017-01-26 LAB — IBC PANEL
IRON: 143 ug/dL (ref 42–165)
SATURATION RATIOS: 53.5 % — AB (ref 20.0–50.0)
TRANSFERRIN: 191 mg/dL — AB (ref 212.0–360.0)

## 2017-01-26 LAB — FERRITIN: Ferritin: 85.3 ng/mL (ref 22.0–322.0)

## 2017-01-31 DIAGNOSIS — H2513 Age-related nuclear cataract, bilateral: Secondary | ICD-10-CM | POA: Diagnosis not present

## 2017-01-31 DIAGNOSIS — H02834 Dermatochalasis of left upper eyelid: Secondary | ICD-10-CM | POA: Diagnosis not present

## 2017-01-31 DIAGNOSIS — H40003 Preglaucoma, unspecified, bilateral: Secondary | ICD-10-CM | POA: Diagnosis not present

## 2017-01-31 DIAGNOSIS — H02831 Dermatochalasis of right upper eyelid: Secondary | ICD-10-CM | POA: Diagnosis not present

## 2017-02-27 ENCOUNTER — Other Ambulatory Visit: Payer: Self-pay | Admitting: Cardiovascular Disease

## 2017-03-02 ENCOUNTER — Ambulatory Visit (INDEPENDENT_AMBULATORY_CARE_PROVIDER_SITE_OTHER): Payer: Medicare Other | Admitting: Cardiovascular Disease

## 2017-03-02 ENCOUNTER — Encounter: Payer: Self-pay | Admitting: Cardiovascular Disease

## 2017-03-02 VITALS — BP 134/71 | HR 52 | Ht 71.0 in | Wt 202.8 lb

## 2017-03-02 DIAGNOSIS — I480 Paroxysmal atrial fibrillation: Secondary | ICD-10-CM

## 2017-03-02 DIAGNOSIS — Z9989 Dependence on other enabling machines and devices: Secondary | ICD-10-CM

## 2017-03-02 DIAGNOSIS — I10 Essential (primary) hypertension: Secondary | ICD-10-CM | POA: Diagnosis not present

## 2017-03-02 DIAGNOSIS — H2512 Age-related nuclear cataract, left eye: Secondary | ICD-10-CM | POA: Diagnosis not present

## 2017-03-02 DIAGNOSIS — Z7901 Long term (current) use of anticoagulants: Secondary | ICD-10-CM

## 2017-03-02 DIAGNOSIS — H2513 Age-related nuclear cataract, bilateral: Secondary | ICD-10-CM | POA: Diagnosis not present

## 2017-03-02 DIAGNOSIS — G4733 Obstructive sleep apnea (adult) (pediatric): Secondary | ICD-10-CM | POA: Diagnosis not present

## 2017-03-02 MED ORDER — METOPROLOL SUCCINATE ER 25 MG PO TB24: ORAL_TABLET | ORAL | 3 refills | Status: AC

## 2017-03-02 NOTE — Progress Notes (Signed)
Patient ID: Andrew [name], male   DOB: [birth year], 83 y.o.   MRN: [id number]    HPI: Andrew [name] is a 83 y.o. male who presents for a 6 month cardiology evaluation.  Mrs. Liby has a history of paroxysmal atrial fibrillation. He was initially found to be in atrial fibrillation  in February 2013 and was placed on Xarelto and beta blocker therapy. He developed recurrent atrial fibrillation after his initial cardioversion. He was found to have obstructive sleep apnea and prior to a second cardioversion CPAP/BiPAP was instituted. He has been on BiPAP therapy ever since. His second cardioversion June 2013 was successful and with treatment of his sleep apnea and propofenone/ toprol he has maintained sinus rhythm. His DME company is [employer].  Since I last saw him he is unaware of any breakthrough atrial fibrillation or tachycardic spells. He uses his BiPAP with 100% compliance. Typically he goes to bed approximately 8 PM and wakes up between 5:30 and 6 AM. His sleep is restorative. He is unaware breakthrough snoring. There is no residual daytime sleepiness.  He has a history of hemachromatosis and has phlebotomies every 3 months.. He denies chest pain. He denies palpitations. He has a history of hypertension and has been on amlodipine 5 mg, lisinopril 40 mg, Toprol XL 25 mg. He states that his BP at home typically in the 026'V systolic.   Andrew [name]  resides at [facility] in independent living.  He is eating better, and swims at least 2-3 times per week and does exercise classes.  Since I last saw him, he has remained stable.  His resting pulse is in the 50s, but he denies any dizziness or orthostasis symptoms.  At times he inadvertently misses an occasional dose of propafenone.  He had blood work done by Dr. Deland Pretty in May 2018.  Glucose was mildly increased at 122.  Hemoglobin and hematocrit were stable.  He is normal renal function with a creatinine of 1.1.  LFTs were normal.  Total  cholesterol was 165, triglycerides 89, HDL 36, and LDL was slightly increased at 111.  He tells me next week.  He will begun to going cataract surgery.  He continues to use CPAP with 100% compliance.  He denies any chest pain, PND, orthopnea.  He presents for evaluation.    Past Medical History:  Diagnosis Date  . Adenomatous colon polyp 10/1993  . Diverticulosis   . 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 Hepatitis B antibody positive   . Hypertension   . OSA treated with BiPAP   . PAF (paroxysmal atrial fibrillation) ([place])     Past Surgical History:  Procedure Laterality Date  . CARDIOVERSION  08/18/2011   Procedure: CARDIOVERSION;  Surgeon: Troy Sine, MD;  Location: [place];  Service: Cardiovascular;  Laterality: N/A;  . CARDIOVERSION  11/22/2011   Procedure: CARDIOVERSION;  Surgeon: Troy Sine, MD;  Location: [place];  Service: Cardiovascular;  Laterality: N/A;  . COLONOSCOPY    . KNEE ARTHROSCOPY    . LAPAROSCOPIC CHOLECYSTECTOMY  04/2009  . NM MYOCAR PERF WALL MOTION  05/08/2008   Normal  . TONSILLECTOMY    . US ECHOCARDIOGRAPHY  07/27/2011   Mild concentric LVH, LA & RA is moderately dilated, mild to mod MR.    No Known Allergies  Current Outpatient Prescriptions  Medication Sig Dispense Refill  . amLODipine (NORVASC) 5 MG tablet Take 5 mg by mouth daily.    [name] Kitchen  lisinopril (PRINIVIL,ZESTRIL) 40 MG tablet Take 40 mg by mouth daily.    . metoprolol succinate (TOPROL-XL) 25 MG 24 hr tablet Alternate taking 25mg  and 12.5mg  daily 135 tablet 3  . propafenone (RYTHMOL) 150 MG tablet TAKE 1 TABLET (150 MG TOTAL) BY MOUTH EVERY 8 (EIGHT) HOURS. 270 tablet 3  . Tamsulosin HCl (FLOMAX) 0.4 MG CAPS Take 0.4 mg by mouth daily.    Alveda Reasons 20 MG TABS tablet TAKE 1 TABLET BY MOUTH DAILY WITH SUPPER 90 tablet 3   No current facility-administered medications for this visit.     Social History   Social History  . Marital status: Married    Spouse  name: N/A  . Number of children: N/A  . Years of education: N/A   Occupational History  . Retired Retired   Social History Main Topics  . Smoking status: Former Research scientist (life sciences)  . Smokeless tobacco: Never Used  . Alcohol use 0.0 oz/week     Comment: 2 glasses if wine daily  . Drug use: No  . Sexual activity: Not on file   Other Topics Concern  . Not on file   Social History Narrative  . No narrative on file    Family History  Problem Relation Age of Onset  . Esophageal cancer Father   . Emphysema Mother   . Stroke [name], he is retired. He is the owner of [employer]. He is married and has 3 children and 9 grandchildren. He does exercise. No tobacco use. He does drink occasional alcohol.  ROS General: Negative; No fevers, chills, or night sweats; positive for fatigue HEENT: He will be undergoing cataract surgery; No changes in hearing, sinus congestion, difficulty swallowing Pulmonary: Negative; No cough, wheezing, shortness of breath, hemoptysis Cardiovascular: Negative; No chest pain, presyncope, syncope, palpitations GI: Negative; No nausea, vomiting, diarrhea, or abdominal pain GU: Negative; No dysuria, hematuria, or difficulty voiding Musculoskeletal: Negative; no myalgias, joint pain, or weakness Hematologic/Oncology: Positive for hemochromatosis undergoing monthly phlebotomies; no easy bruising, bleeding Endocrine: Negative; no heat/cold intolerance; no diabetes Neuro: Negative; no changes in balance, headaches Skin: Negative; No rashes or skin lesions Psychiatric: Negative; No behavioral problems, depression Sleep: Positive for sleep apnea on CPAP therapy with 100% compliance.  No snoring, daytime sleepiness, hypersomnolence, bruxism, restless legs, hypnogognic hallucinations, no cataplexy Other comprehensive 14 point system review is negative.    PE BP 134/71 (BP Location: Right Arm)   Pulse (!) 52   Ht 5\' 11"  (1.803 m)   Wt 202 lb 12.8 oz (92 kg)    BMI 28.28 kg/m    Repeat blood pressure 132/74  Wt Readings from Last 3 Encounters:  03/02/17 202 lb 12.8 oz (92 kg)  08/30/16 199 lb 12.8 oz (90.6 kg)  03/09/16 202 lb 8 oz (91.9 kg)   General: Alert, oriented, no distress.  Skin: normal turgor, no rashes, warm and dry HEENT: Normocephalic, atraumatic. Pupils equal round and reactive to light; sclera anicteric; extraocular muscles intact; Nose without nasal septal hypertrophy Mouth/Parynx benign; Mallinpatti scale 3 Neck: No JVD, no carotid bruits; normal carotid upstroke Lungs: clear to ausculatation and percussion; no wheezing or rales Chest wall: without tenderness to palpitation Heart: PMI not displaced, RRR, s1 s2 normal, 1/6 systolic murmur, no diastolic murmur, no rubs, gallops, thrills, or heaves Abdomen: soft, nontender; no hepatosplenomehaly, BS+; abdominal aorta nontender and not dilated by palpation. Back: no CVA tenderness Pulses 2+ Musculoskeletal: full range of motion, normal strength, no joint deformities Extremities: no  clubbing cyanosis or edema, Homan's sign negative  Neurologic: grossly nonfocal; Cranial nerves grossly wnl Psychologic: Normal mood and affect   ECG (independently read by me): Sinus bradycardia 52 bpm.  First degree AV block with a PR interval 240 ms.  No ectopy.  Normal QTc interval.  ECG (independently read by me): Sinus bradycardia 53 bpm.  First-degree AV block with a PR interval at 222 ms.  QTc interval 452 ms.  No signal ST-T changes.  September 2017 ECG (independently read by me): Normal sinus rhythm at 61 bpm.  First-degree AV block with a PR interval of 216 ms.  Nonspecific T changes.  September 2016 ECG (independently read by me): Sinus bradycardia 59 bpm.  First-degree AV block with a PR interval at 238 ms.  QTc interval 465 ms.  Incomplete right bundle branch block.  March 2016 ECG (independently read by me): NSR at 63 with first-degree AV block.   October 2015 ECG  (independently read by me): Sinus bradycardia at 54 beats per minute with first-degree AV block.  Nonspecific T changes.  Prior March 2015 ECG: Sinus bradycardia at 51 per minute. First degree AV block; QTc interval 458 ms  LABS: Laboratory from Dr. Pennie Banter office on 09/22/2015: Hemoglobin 16.1./Hematocrit 46.3/platelets 171, BUN 12, creatinine 0.9.  Serum 4.2.  LFTs normal. Cholesterol 193, triglycerides 92, HDL 47, LDL 128  Laboratory from 10/04/2016 was reviewed as noted above.  BMP Latest Ref Rng & Units 04/22/2009  Glucose 70 - 99 mg/dL 111(H)  BUN 6 - 23 mg/dL 9  Creatinine 0.4 - 1.5 mg/dL 0.78  Sodium 135 - 145 mEq/L 139  Potassium 3.5 - 5.1 mEq/L 4.1  Chloride 96 - 112 mEq/L 101  CO2 19 - 32 mEq/L 31  Calcium 8.4 - 10.5 mg/dL 9.5      Component Value Date/Time   PROT 7.0 07/07/2009 1448   ALBUMIN 4.0 07/07/2009 1448   AST 37 07/07/2009 1448   ALT 36 07/07/2009 1448   ALKPHOS 68 07/07/2009 1448   BILITOT 0.7 07/07/2009 1448   BILIDIR 0.2 07/07/2009 1448    CBC Latest Ref Rng & Units 01/26/2017 10/20/2016 07/20/2016  WBC 4.0 - 10.5 K/uL 6.3 - -  Hemoglobin 13.0 - 17.0 g/dL 15.8 16.1 16.0  Hematocrit 39.0 - 52.0 % 45.9 45.8 45.2  Platelets 150.0 - 400.0 K/uL 167.0 - -   Lab Results  Component Value Date   MCV 93.7 01/26/2017   MCV 92.9 03/23/2014   MCV 92.4 12/18/2013   Lipid Panel  No results found for: CHOL, TRIG, HDL, CHOLHDL, VLDL, LDLCALC, LDLDIRECT   Lab work done on 10/27/2014 by Dr. Deland Pretty were reviewed: Hemoglobin A1c 5.7.  Hemoglobin 16.5, hematocrit 47.0.  BUN 14, creatinine 1.2.  Normal liver function studies.  Total cluster 183, triglycerides 101, HDL 39, LDL 124.  Urinalysis negative.  RADIOLOGY: No results found.  IMPRESSION:  1. Essential hypertension   2. Paroxysmal atrial fibrillation (HCC)   3. Anticoagulation adequate   4. OSA on CPAP   5. Hereditary hemochromatosis ([place])     ASSESSMENT AND PLAN: Andrew [name] a young appearing  83 year old white male with a history of PAF and is maintaining sinus rhythm on his current medical regimen consisting of propofenone 150 mg every 8 hours in addition to Toprol-XL 25 mg. He is unaware of any breakthrough arrhythmia.  He takes lisinopril 40 mg and amlodipine 5 mg for blood pressure. [name] Kitchen  His ECG today shows sinus bradycardia 52 bpm.  I have  suggested a very slight reduction in his Toprol dose and he will alternate 25 mg and 12.5 mg every other day.  I reviewed his recent laboratory.  Glucose was mildly increased at 122, as was his LDL at 111.  Creatinine is 1.1.  He is on anticoagulation with Xarelto 20 mg based on his renal function.  Since he will be undergoing eye surgery next week.  I recommended he hold Xarelto for 48 hours.  He will resume this when okay with his ophthalmologist.  He continues to use CPAP with 100% compliance.  Since he has been on CPAP therapy, he has not had any recurrence of atrial fibrillation.  We discussed improve compliance with propafenone.  His QTc interval is normal.  I will see him in 6 months for reevaluation.  Troy Sine, MD, [facility]  03/02/2017 11:37 AM

## 2017-03-02 NOTE — Patient Instructions (Signed)
Medication Instructions:  Alternate taking Metoprolol succinate (Toprol XL) 25mg  (1 tablet) and 12.5 mg (1/2 tablet) --25 mg on even days and 12.5 mg on odd day   Follow-Up: Your physician wants you to follow-up in: 6 MONTHS with Dr. Claiborne Billings. You will receive a reminder letter in the mail two months in advance. If you don't receive a letter, please call our office to schedule the follow-up appointment.   Any Other Special Instructions Will Be Listed Below (If Applicable).     If you need a refill on your cardiac medications before your next appointment, please call your pharmacy.

## 2017-03-03 DIAGNOSIS — Z23 Encounter for immunization: Secondary | ICD-10-CM | POA: Diagnosis not present

## 2017-03-08 DIAGNOSIS — H25812 Combined forms of age-related cataract, left eye: Secondary | ICD-10-CM | POA: Diagnosis not present

## 2017-03-08 DIAGNOSIS — G4733 Obstructive sleep apnea (adult) (pediatric): Secondary | ICD-10-CM | POA: Diagnosis not present

## 2017-03-08 DIAGNOSIS — Z87891 Personal history of nicotine dependence: Secondary | ICD-10-CM | POA: Diagnosis not present

## 2017-03-08 DIAGNOSIS — I480 Paroxysmal atrial fibrillation: Secondary | ICD-10-CM | POA: Diagnosis not present

## 2017-03-08 DIAGNOSIS — H25813 Combined forms of age-related cataract, bilateral: Secondary | ICD-10-CM | POA: Diagnosis not present

## 2017-03-08 DIAGNOSIS — I10 Essential (primary) hypertension: Secondary | ICD-10-CM | POA: Diagnosis not present

## 2017-04-12 DIAGNOSIS — Z9842 Cataract extraction status, left eye: Secondary | ICD-10-CM | POA: Diagnosis not present

## 2017-04-12 DIAGNOSIS — I10 Essential (primary) hypertension: Secondary | ICD-10-CM | POA: Diagnosis not present

## 2017-04-12 DIAGNOSIS — I480 Paroxysmal atrial fibrillation: Secondary | ICD-10-CM | POA: Diagnosis not present

## 2017-04-12 DIAGNOSIS — Z87891 Personal history of nicotine dependence: Secondary | ICD-10-CM | POA: Diagnosis not present

## 2017-04-12 DIAGNOSIS — Z7901 Long term (current) use of anticoagulants: Secondary | ICD-10-CM | POA: Diagnosis not present

## 2017-04-12 DIAGNOSIS — Z961 Presence of intraocular lens: Secondary | ICD-10-CM | POA: Diagnosis not present

## 2017-04-12 DIAGNOSIS — Z9989 Dependence on other enabling machines and devices: Secondary | ICD-10-CM | POA: Diagnosis not present

## 2017-04-12 DIAGNOSIS — H25811 Combined forms of age-related cataract, right eye: Secondary | ICD-10-CM | POA: Diagnosis not present

## 2017-04-12 DIAGNOSIS — G4733 Obstructive sleep apnea (adult) (pediatric): Secondary | ICD-10-CM | POA: Diagnosis not present

## 2017-04-24 ENCOUNTER — Other Ambulatory Visit (INDEPENDENT_AMBULATORY_CARE_PROVIDER_SITE_OTHER): Payer: Medicare Other

## 2017-04-24 LAB — CBC WITH DIFFERENTIAL/PLATELET
BASOS PCT: 0.6 % (ref 0.0–3.0)
Basophils Absolute: 0 10*3/uL (ref 0.0–0.1)
EOS PCT: 2.5 % (ref 0.0–5.0)
Eosinophils Absolute: 0.2 10*3/uL (ref 0.0–0.7)
HEMATOCRIT: 48.4 % (ref 39.0–52.0)
HEMOGLOBIN: 16.5 g/dL (ref 13.0–17.0)
LYMPHS PCT: 19.4 % (ref 12.0–46.0)
Lymphs Abs: 1.4 10*3/uL (ref 0.7–4.0)
MCHC: 34.1 g/dL (ref 30.0–36.0)
MCV: 93.1 fl (ref 78.0–100.0)
MONOS PCT: 10.1 % (ref 3.0–12.0)
Monocytes Absolute: 0.7 10*3/uL (ref 0.1–1.0)
Neutro Abs: 4.8 10*3/uL (ref 1.4–7.7)
Neutrophils Relative %: 67.4 % (ref 43.0–77.0)
Platelets: 186 10*3/uL (ref 150.0–400.0)
RBC: 5.19 Mil/uL (ref 4.22–5.81)
RDW: 12.5 % (ref 11.5–15.5)
WBC: 7.1 10*3/uL (ref 4.0–10.5)

## 2017-04-24 LAB — IBC PANEL
Iron: 173 ug/dL — ABNORMAL HIGH (ref 42–165)
SATURATION RATIOS: 59.4 % — AB (ref 20.0–50.0)
Transferrin: 208 mg/dL — ABNORMAL LOW (ref 212.0–360.0)

## 2017-04-24 LAB — FERRITIN: Ferritin: 117.8 ng/mL (ref 22.0–322.0)

## 2017-06-12 ENCOUNTER — Telehealth: Payer: Self-pay | Admitting: *Deleted

## 2017-06-12 NOTE — Telephone Encounter (Signed)
CPAP supply order signed by Dr. Kelly and returned to [facility]. 

## 2017-06-25 DIAGNOSIS — N401 Enlarged prostate with lower urinary tract symptoms: Secondary | ICD-10-CM | POA: Diagnosis not present

## 2017-06-25 DIAGNOSIS — R350 Frequency of micturition: Secondary | ICD-10-CM | POA: Diagnosis not present

## 2017-06-25 DIAGNOSIS — R351 Nocturia: Secondary | ICD-10-CM | POA: Diagnosis not present

## 2017-07-04 DIAGNOSIS — D2272 Melanocytic nevi of left lower limb, including hip: Secondary | ICD-10-CM | POA: Diagnosis not present

## 2017-07-04 DIAGNOSIS — D2271 Melanocytic nevi of right lower limb, including hip: Secondary | ICD-10-CM | POA: Diagnosis not present

## 2017-07-04 DIAGNOSIS — D1801 Hemangioma of skin and subcutaneous tissue: Secondary | ICD-10-CM | POA: Diagnosis not present

## 2017-07-04 DIAGNOSIS — L570 Actinic keratosis: Secondary | ICD-10-CM | POA: Diagnosis not present

## 2017-07-04 DIAGNOSIS — Z85828 Personal history of other malignant neoplasm of skin: Secondary | ICD-10-CM | POA: Diagnosis not present

## 2017-07-04 DIAGNOSIS — D692 Other nonthrombocytopenic purpura: Secondary | ICD-10-CM | POA: Diagnosis not present

## 2017-07-04 DIAGNOSIS — D224 Melanocytic nevi of scalp and neck: Secondary | ICD-10-CM | POA: Diagnosis not present

## 2017-07-04 DIAGNOSIS — D225 Melanocytic nevi of trunk: Secondary | ICD-10-CM | POA: Diagnosis not present

## 2017-07-04 DIAGNOSIS — D2261 Melanocytic nevi of right upper limb, including shoulder: Secondary | ICD-10-CM | POA: Diagnosis not present

## 2017-07-04 DIAGNOSIS — L821 Other seborrheic keratosis: Secondary | ICD-10-CM | POA: Diagnosis not present

## 2017-07-26 ENCOUNTER — Other Ambulatory Visit (INDEPENDENT_AMBULATORY_CARE_PROVIDER_SITE_OTHER): Payer: Medicare Other

## 2017-07-26 ENCOUNTER — Telehealth: Payer: Self-pay | Admitting: Gastroenterology

## 2017-07-26 LAB — CBC WITH DIFFERENTIAL/PLATELET
Basophils Absolute: 0.1 10*3/uL (ref 0.0–0.1)
Basophils Relative: 0.7 % (ref 0.0–3.0)
EOS ABS: 0.2 10*3/uL (ref 0.0–0.7)
EOS PCT: 3 % (ref 0.0–5.0)
HCT: 44.2 % (ref 39.0–52.0)
Hemoglobin: 15.3 g/dL (ref 13.0–17.0)
LYMPHS ABS: 1.8 10*3/uL (ref 0.7–4.0)
Lymphocytes Relative: 23.6 % (ref 12.0–46.0)
MCHC: 34.7 g/dL (ref 30.0–36.0)
MCV: 91.2 fl (ref 78.0–100.0)
MONO ABS: 0.8 10*3/uL (ref 0.1–1.0)
Monocytes Relative: 10.4 % (ref 3.0–12.0)
NEUTROS PCT: 62.3 % (ref 43.0–77.0)
Neutro Abs: 4.7 10*3/uL (ref 1.4–7.7)
Platelets: 186 10*3/uL (ref 150.0–400.0)
RBC: 4.84 Mil/uL (ref 4.22–5.81)
RDW: 12.3 % (ref 11.5–15.5)
WBC: 7.6 10*3/uL (ref 4.0–10.5)

## 2017-07-26 LAB — IBC PANEL
Iron: 151 ug/dL (ref 42–165)
SATURATION RATIOS: 53.9 % — AB (ref 20.0–50.0)
TRANSFERRIN: 200 mg/dL — AB (ref 212.0–360.0)

## 2017-07-26 LAB — FERRITIN: FERRITIN: 84.1 ng/mL (ref 22.0–322.0)

## 2017-07-26 NOTE — Telephone Encounter (Signed)
Left message for patient to call back  

## 2017-07-26 NOTE — Telephone Encounter (Signed)
Patient was asking if his phlebotomy orders are in.  He is notified that they are and he can come at his convenience

## 2017-08-30 ENCOUNTER — Encounter: Payer: Self-pay | Admitting: Cardiovascular Disease

## 2017-08-30 ENCOUNTER — Ambulatory Visit (INDEPENDENT_AMBULATORY_CARE_PROVIDER_SITE_OTHER): Payer: Medicare Other | Admitting: Cardiovascular Disease

## 2017-08-30 VITALS — BP 124/68 | HR 60 | Ht 71.0 in | Wt 203.0 lb

## 2017-08-30 DIAGNOSIS — Z7901 Long term (current) use of anticoagulants: Secondary | ICD-10-CM

## 2017-08-30 DIAGNOSIS — I10 Essential (primary) hypertension: Secondary | ICD-10-CM

## 2017-08-30 DIAGNOSIS — G4733 Obstructive sleep apnea (adult) (pediatric): Secondary | ICD-10-CM

## 2017-08-30 DIAGNOSIS — I480 Paroxysmal atrial fibrillation: Secondary | ICD-10-CM

## 2017-08-30 NOTE — Patient Instructions (Signed)
Medication Instructions:  No medication changes   Follow-Up: Your physician recommends that you schedule a follow-up appointment in: 6 months with Dr. Claiborne Billings      If you need a refill on your cardiac medications before your next appointment, please call your pharmacy.

## 2017-08-30 NOTE — Progress Notes (Signed)
Patient ID: SOCORRO EBRON, male   DOB: [birth year], 84 y.o.   MRN: [id number]    HPI: JALEIL RENWICK is a 84 y.o. male who presents for a 6 month cardiology evaluation.  Mrs. Hightower has a history of paroxysmal atrial fibrillation. He was initially found to be in atrial fibrillation  in February 2013 and was placed on Xarelto and beta blocker therapy. He developed recurrent atrial fibrillation after his initial cardioversion. He was found to have obstructive sleep apnea and prior to a second cardioversion CPAP/BiPAP was instituted. He has been on BiPAP therapy ever since. His second cardioversion June 2013 was successful and with treatment of his sleep apnea and propofenone/ toprol he has maintained sinus rhythm. His DME company is [employer].  Since I last saw him he is unaware of any breakthrough atrial fibrillation or tachycardic spells. He uses his BiPAP with 100% compliance. Typically he goes to bed approximately 8 PM and wakes up between 5:30 and 6 AM. His sleep is restorative. He is unaware breakthrough snoring. There is no residual daytime sleepiness.  He has a history of hemachromatosis and has phlebotomies every 3 months.. He denies chest pain. He denies palpitations. He has a history of hypertension and has been on amlodipine 5 mg, lisinopril 40 mg, Toprol XL 25 mg. He states that his BP at home typically in the 440'N systolic.   Mr. Capek  resides at [facility] in independent living.  He is eating better, and swims at least 2-3 times per week and does exercise classes. He had blood work done by Dr. Deland Pretty in May 2018.  Glucose was mildly increased at 122.  Hemoglobin and hematocrit were stable.  He is normal renal function with a creatinine of 1.1.  LFTs were normal.  Total cholesterol was 165, triglycerides 89, HDL 36, and LDL was slightly increased at 111.  Since I last saw him in September 2018 he has continued to remain stable.  He is unaware of any recurrent atrial  fibrillation.  He remains active.  He continues to use BiPAP with 100% compliance.  He denies any presyncope or syncope.  He he went cataract surgery.[name] Kitchen  He continues to use CPAP with 100% compliance.  He denies any chest pain, PND, orthopnea.  He presents for evaluation.    Past Medical History:  Diagnosis Date  . Adenomatous colon polyp 10/1993  . Diverticulosis   . 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 Hepatitis B antibody positive   . Hypertension   . OSA treated with BiPAP   . PAF (paroxysmal atrial fibrillation) ([place])     Past Surgical History:  Procedure Laterality Date  . CARDIOVERSION  08/18/2011   Procedure: CARDIOVERSION;  Surgeon: Troy Sine, MD;  Location: [facility];  Service: Cardiovascular;  Laterality: N/A;  . CARDIOVERSION  11/22/2011   Procedure: CARDIOVERSION;  Surgeon: Troy Sine, MD;  Location: [facility];  Service: Cardiovascular;  Laterality: N/A;  . COLONOSCOPY    . KNEE ARTHROSCOPY    . LAPAROSCOPIC CHOLECYSTECTOMY  04/2009  . NM MYOCAR PERF WALL MOTION  05/08/2008   Normal  . TONSILLECTOMY    . US ECHOCARDIOGRAPHY  07/27/2011   Mild concentric LVH, LA & RA is moderately dilated, mild to mod MR.    No Known Allergies  Current Outpatient Medications  Medication Sig Dispense Refill  . alfuzosin (UROXATRAL) 10 MG 24 hr tablet Take 10 mg by mouth daily with  breakfast.    . amLODipine (NORVASC) 5 MG tablet Take 5 mg by mouth daily.    . finasteride (PROSCAR) 5 MG tablet Take 5 mg by mouth daily.    [name] Kitchen lisinopril (PRINIVIL,ZESTRIL) 40 MG tablet Take 40 mg by mouth daily.    . metoprolol succinate (TOPROL-XL) 25 MG 24 hr tablet Alternate taking 25mg  and 12.5mg  daily 135 tablet 3  . propafenone (RYTHMOL) 150 MG tablet TAKE 1 TABLET (150 MG TOTAL) BY MOUTH EVERY 8 (EIGHT) HOURS. 270 tablet 3  . XARELTO 20 MG TABS tablet TAKE 1 TABLET BY MOUTH DAILY WITH SUPPER 90 tablet 3   No current facility-administered medications for this visit.      Social History   Socioeconomic History  . Marital status: Married    Spouse name: Not on file  . Number of children: Not on file  . Years of education: Not on file  . Highest education level: Not on file  Occupational History  . Occupation: Retired    Fish farm manager: RETIRED  Social Needs  . Financial resource strain: Not on file  . Food insecurity:    Worry: Not on file    Inability: Not on file  . Transportation needs:    Medical: Not on file    Non-medical: Not on file  Tobacco Use  . Smoking status: Former Research scientist (life sciences)  . Smokeless tobacco: Never Used  Substance and Sexual Activity  . Alcohol use: Yes    Alcohol/week: 0.0 oz    Comment: 2 glasses if wine daily  . Drug use: No  . Sexual activity: Not on file  Lifestyle  . Physical activity:    Days per week: Not on file    Minutes per session: Not on file  . Stress: Not on file  Relationships  . Social connections:    Talks on phone: Not on file    Gets together: Not on file    Attends religious service: Not on file    Active member of club or organization: Not on file    Attends meetings of clubs or organizations: Not on file    Relationship status: Not on file  . Intimate partner violence:    Fear of current or ex partner: Not on file    Emotionally abused: Not on file    Physically abused: Not on file    Forced sexual activity: Not on file  Other Topics Concern  . Not on file  Social History Narrative  . Not on file    Family History  Problem Relation Age of Onset  . Esophageal cancer Father   . Emphysema Mother   . Stroke [name], he is retired. He is the owner of [employer]. He is married and has 3 children and 9 grandchildren. He does exercise. No tobacco use. He does drink occasional alcohol.  ROS General: Negative; No fevers, chills, or night sweats; positive for fatigue HEENT: He will be undergoing cataract surgery; No changes in hearing, sinus congestion, difficulty  swallowing Pulmonary: Negative; No cough, wheezing, shortness of breath, hemoptysis Cardiovascular: Negative; No chest pain, presyncope, syncope, palpitations GI: Negative; No nausea, vomiting, diarrhea, or abdominal pain GU: Negative; No dysuria, hematuria, or difficulty voiding Musculoskeletal: Negative; no myalgias, joint pain, or weakness Hematologic/Oncology: Positive for hemochromatosis undergoing monthly phlebotomies; no easy bruising, bleeding Endocrine: Negative; no heat/cold intolerance; no diabetes Neuro: Negative; no changes in balance, headaches Skin: Negative; No rashes or skin lesions Psychiatric: Negative; No behavioral problems, depression Sleep:  Positive for sleep apnea on CPAP therapy with 100% compliance.  No snoring, daytime sleepiness, hypersomnolence, bruxism, restless legs, hypnogognic hallucinations, no cataplexy Other comprehensive 14 point system review is negative.    PE BP 124/68 (BP Location: Left Arm, Patient Position: Sitting, Cuff Size: Normal)   Pulse 60   Ht 5\' 11"  (1.803 m)   Wt 203 lb (92.1 kg)   BMI 28.31 kg/m    Repeat blood pressure by me 134/70  Wt Readings from Last 3 Encounters:  08/30/17 203 lb (92.1 kg)  03/02/17 202 lb 12.8 oz (92 kg)  08/30/16 199 lb 12.8 oz (90.6 kg)     Physical Exam BP 124/68 (BP Location: Left Arm, Patient Position: Sitting, Cuff Size: Normal)   Pulse 60   Ht 5\' 11"  (1.803 m)   Wt 203 lb (92.1 kg)   BMI 28.31 kg/m  General: Alert, oriented, no distress.  Skin: normal turgor, no rashes, warm and dry HEENT: Normocephalic, atraumatic. Pupils equal round and reactive to light; sclera anicteric; extraocular muscles intact; , status post cataract surgery Nose without nasal septal hypertrophy Mouth/Parynx benign; Mallinpatti scale 3 Neck: No JVD, no carotid bruits; normal carotid upstroke Lungs: clear to ausculatation and percussion; no wheezing or rales Chest wall: without tenderness to palpitation Heart: PMI  not displaced, RRR, s1 s2 normal, 1/6 systolic murmur, no diastolic murmur, no rubs, gallops, thrills, or heaves Abdomen: soft, nontender; no hepatosplenomehaly, BS+; abdominal aorta nontender and not dilated by palpation. Back: no CVA tenderness Pulses 2+ Musculoskeletal: full range of motion, normal strength, no joint deformities Extremities: no clubbing cyanosis or edema, Homan's sign negative  Neurologic: grossly nonfocal; Cranial nerves grossly wnl Psychologic: Normal mood and affect    ECG (independently read by me): normal sinus rhythm at 60 bpm.  First-degree AV block.  PR interval 230 ms.  Left axis deviation.  September 2018 ECG (independently read by me): Sinus bradycardia 52 bpm.  First degree AV block with a PR interval 240 ms.  No ectopy.  Normal QTc interval.  March 2018 ECG (independently read by me): Sinus bradycardia 53 bpm.  First-degree AV block with a PR interval at 222 ms.  QTc interval 452 ms.  No signal ST-T changes.  September 2017 ECG (independently read by me): Normal sinus rhythm at 61 bpm.  First-degree AV block with a PR interval of 216 ms.  Nonspecific T changes.  September 2016 ECG (independently read by me): Sinus bradycardia 59 bpm.  First-degree AV block with a PR interval at 238 ms.  QTc interval 465 ms.  Incomplete right bundle branch block.  March 2016 ECG (independently read by me): NSR at 63 with first-degree AV block.   October 2015 ECG (independently read by me): Sinus bradycardia at 54 beats per minute with first-degree AV block.  Nonspecific T changes.   March 2015 ECG: Sinus bradycardia at 51 per minute. First degree AV block; QTc interval 458 ms  LABS: Laboratory from Dr. Pennie Banter office on 09/22/2015: Hemoglobin 16.1./Hematocrit 46.3/platelets 171, BUN 12, creatinine 0.9.  Serum 4.2.  LFTs normal. Cholesterol 193, triglycerides 92, HDL 47, LDL 128  Laboratory from 10/04/2016 was reviewed as noted above.  BMP Latest Ref Rng & Units  04/22/2009  Glucose 70 - 99 mg/dL 111(H)  BUN 6 - 23 mg/dL 9  Creatinine 0.4 - 1.5 mg/dL 0.78  Sodium 135 - 145 mEq/L 139  Potassium 3.5 - 5.1 mEq/L 4.1  Chloride 96 - 112 mEq/L 101  CO2 19 - 32 mEq/L  31  Calcium 8.4 - 10.5 mg/dL 9.5      Component Value Date/Time   PROT 7.0 07/07/2009 1448   ALBUMIN 4.0 07/07/2009 1448   AST 37 07/07/2009 1448   ALT 36 07/07/2009 1448   ALKPHOS 68 07/07/2009 1448   BILITOT 0.7 07/07/2009 1448   BILIDIR 0.2 07/07/2009 1448    CBC Latest Ref Rng & Units 07/26/2017 04/24/2017 01/26/2017  WBC 4.0 - 10.5 K/uL 7.6 7.1 6.3  Hemoglobin 13.0 - 17.0 g/dL 15.3 16.5 15.8  Hematocrit 39.0 - 52.0 % 44.2 48.4 45.9  Platelets 150.0 - 400.0 K/uL 186.0 186.0 167.0   Lab Results  Component Value Date   MCV 91.2 07/26/2017   MCV 93.1 04/24/2017   MCV 93.7 01/26/2017   Lipid Panel  No results found for: CHOL, TRIG, HDL, CHOLHDL, VLDL, LDLCALC, LDLDIRECT   Lab work done on 10/27/2014 by Dr. Deland Pretty were reviewed: Hemoglobin A1c 5.7.  Hemoglobin 16.5, hematocrit 47.0.  BUN 14, creatinine 1.2.  Normal liver function studies.  Total cluster 183, triglycerides 101, HDL 39, LDL 124.  Urinalysis negative.  RADIOLOGY: No results found.  IMPRESSION:  1. PAF (paroxysmal atrial fibrillation) ([place])   2. OSA (obstructive sleep apnea) on BiPAP   3. Anticoagulation adequate   4. Essential hypertension   5. Hereditary hemochromatosis ([place])     ASSESSMENT AND PLAN: Mr. Fitzpatrick a young appearing 84 year old white male with a history of PAF and is maintaining sinus rhythm on his current medical regimen consisting of propofenone 150 mg every 8 hours in addition to Toprol-XL 25 mg. He is unaware of any breakthrough arrhythmia.   His blood pressure today is upper normal on amlodipine 5 mg, lisinopril 40 mg and Toprol-XL 25 mg alternating with 12.5 mg every other day.  He is not having any episodes of dizziness or syncope.  He continues to be on Xarelto for  anticoagulation and has not had any bleeding issues.  He remains active, participating in the activities at friend[facility] [place] in independent living. [name] Kitchen  He is unaware of any breakthrough snoring and sleeps well on BiPAP therapy. There is no daytime sleepiness.  He will be undergoing complete lab work by Dr. Shelia Media in May.  I will ask that they be sent to me for my review.  As long as he remains stable I will see him in 6 months for reevaluation.  Troy Sine, MD, [facility]  08/31/2017 6:07 PM

## 2017-08-31 ENCOUNTER — Encounter: Payer: Self-pay | Admitting: Cardiovascular Disease

## 2017-09-24 ENCOUNTER — Other Ambulatory Visit: Payer: Medicare Other

## 2017-09-24 ENCOUNTER — Other Ambulatory Visit: Payer: Self-pay

## 2017-10-08 ENCOUNTER — Telehealth: Payer: Self-pay | Admitting: Gastroenterology

## 2017-10-08 NOTE — Telephone Encounter (Signed)
Patient was asking if his phlebotomy results are back and when he needs to come back in.

## 2017-10-08 NOTE — Telephone Encounter (Signed)
Left message for pt to call back  °

## 2017-10-09 NOTE — Telephone Encounter (Signed)
Spoke with pt and per his lab results in Feb he was to have repeat phlebotomy done in April with labs. Spoke with Cassie in the lab and she states that labs were drawn but then they were cancelled by Barb Merino RN. They did record an H&H when they did the phlebotomy. Not sure why labs were cancelled. Pt would like to speak with [facility] when she returns.

## 2017-10-11 DIAGNOSIS — I10 Essential (primary) hypertension: Secondary | ICD-10-CM | POA: Diagnosis not present

## 2017-10-11 DIAGNOSIS — E785 Hyperlipidemia, unspecified: Secondary | ICD-10-CM | POA: Diagnosis not present

## 2017-10-15 NOTE — Telephone Encounter (Signed)
Patient notified that he is due q 2 months.  He verbalized understanding.

## 2017-10-16 DIAGNOSIS — Z6827 Body mass index (BMI) 27.0-27.9, adult: Secondary | ICD-10-CM | POA: Diagnosis not present

## 2017-10-16 DIAGNOSIS — E785 Hyperlipidemia, unspecified: Secondary | ICD-10-CM | POA: Diagnosis not present

## 2017-10-16 DIAGNOSIS — L570 Actinic keratosis: Secondary | ICD-10-CM | POA: Diagnosis not present

## 2017-10-16 DIAGNOSIS — K5730 Diverticulosis of large intestine without perforation or abscess without bleeding: Secondary | ICD-10-CM | POA: Diagnosis not present

## 2017-10-16 DIAGNOSIS — I4891 Unspecified atrial fibrillation: Secondary | ICD-10-CM | POA: Diagnosis not present

## 2017-10-16 DIAGNOSIS — R7303 Prediabetes: Secondary | ICD-10-CM | POA: Diagnosis not present

## 2017-10-16 DIAGNOSIS — G4733 Obstructive sleep apnea (adult) (pediatric): Secondary | ICD-10-CM | POA: Diagnosis not present

## 2017-10-16 DIAGNOSIS — Z0000 Encounter for general adult medical examination without abnormal findings: Secondary | ICD-10-CM | POA: Diagnosis not present

## 2017-10-16 DIAGNOSIS — Z7901 Long term (current) use of anticoagulants: Secondary | ICD-10-CM | POA: Diagnosis not present

## 2017-10-16 DIAGNOSIS — I10 Essential (primary) hypertension: Secondary | ICD-10-CM | POA: Diagnosis not present

## 2017-10-16 DIAGNOSIS — R05 Cough: Secondary | ICD-10-CM | POA: Diagnosis not present

## 2017-12-03 ENCOUNTER — Other Ambulatory Visit (INDEPENDENT_AMBULATORY_CARE_PROVIDER_SITE_OTHER): Payer: Medicare Other

## 2017-12-03 LAB — CBC WITH DIFFERENTIAL/PLATELET
BASOS PCT: 0.7 % (ref 0.0–3.0)
Basophils Absolute: 0 10*3/uL (ref 0.0–0.1)
EOS PCT: 1.4 % (ref 0.0–5.0)
Eosinophils Absolute: 0.1 10*3/uL (ref 0.0–0.7)
HEMATOCRIT: 44.6 % (ref 39.0–52.0)
Hemoglobin: 15.4 g/dL (ref 13.0–17.0)
LYMPHS PCT: 18.8 % (ref 12.0–46.0)
Lymphs Abs: 1.3 10*3/uL (ref 0.7–4.0)
MCHC: 34.5 g/dL (ref 30.0–36.0)
MCV: 91.1 fl (ref 78.0–100.0)
MONOS PCT: 8.9 % (ref 3.0–12.0)
Monocytes Absolute: 0.6 10*3/uL (ref 0.1–1.0)
NEUTROS ABS: 4.9 10*3/uL (ref 1.4–7.7)
Neutrophils Relative %: 70.2 % (ref 43.0–77.0)
PLATELETS: 173 10*3/uL (ref 150.0–400.0)
RBC: 4.89 Mil/uL (ref 4.22–5.81)
RDW: 12.6 % (ref 11.5–15.5)
WBC: 7 10*3/uL (ref 4.0–10.5)

## 2017-12-03 LAB — IBC PANEL
IRON: 143 ug/dL (ref 42–165)
Saturation Ratios: 49.8 % (ref 20.0–50.0)
Transferrin: 205 mg/dL — ABNORMAL LOW (ref 212.0–360.0)

## 2017-12-03 LAB — FERRITIN: Ferritin: 55.9 ng/mL (ref 22.0–322.0)

## 2018-01-23 ENCOUNTER — Other Ambulatory Visit: Payer: Self-pay | Admitting: Cardiovascular Disease

## 2018-01-24 NOTE — Telephone Encounter (Signed)
Recent blood work @ [facility] 

## 2018-02-20 ENCOUNTER — Encounter: Payer: Self-pay | Admitting: Cardiovascular Disease

## 2018-02-20 ENCOUNTER — Ambulatory Visit (INDEPENDENT_AMBULATORY_CARE_PROVIDER_SITE_OTHER): Payer: Medicare Other | Admitting: Cardiovascular Disease

## 2018-02-20 VITALS — BP 142/84 | HR 66 | Ht 71.0 in | Wt 178.0 lb

## 2018-02-20 DIAGNOSIS — G4733 Obstructive sleep apnea (adult) (pediatric): Secondary | ICD-10-CM | POA: Diagnosis not present

## 2018-02-20 DIAGNOSIS — Z7901 Long term (current) use of anticoagulants: Secondary | ICD-10-CM | POA: Diagnosis not present

## 2018-02-20 DIAGNOSIS — I10 Essential (primary) hypertension: Secondary | ICD-10-CM

## 2018-02-20 DIAGNOSIS — I480 Paroxysmal atrial fibrillation: Secondary | ICD-10-CM

## 2018-02-20 NOTE — Patient Instructions (Signed)

## 2018-02-20 NOTE — Progress Notes (Signed)
Patient ID: Andrew [name], male   DOB: 1931-04-17, 84 y.o.   MRN: [id number]    HPI: Andrew [name] is a 84 y.o. male who presents for a 6 month cardiology evaluation.  Andrew [name] has a history of paroxysmal atrial fibrillation. He was initially found to be in atrial fibrillation  in February 2013 and was placed on Xarelto and beta blocker therapy. He developed recurrent atrial fibrillation after his initial cardioversion. He was found to have obstructive sleep apnea and prior to a second cardioversion CPAP/BiPAP was instituted. He has been on BiPAP therapy ever since. His second cardioversion June 2013 was successful and with treatment of his sleep apnea and propofenone/ toprol he has maintained sinus rhythm. His DME company is [employer].  Since I last saw him he is unaware of any breakthrough atrial fibrillation or tachycardic spells. He uses his BiPAP with 100% compliance. Typically he goes to bed approximately 8 PM and wakes up between 5:30 and 6 AM. His sleep is restorative. He is unaware breakthrough snoring. There is no residual daytime sleepiness.  He has a history of hemachromatosis and has phlebotomies every 3 months.. He denies chest pain. He denies palpitations. He has a history of hypertension and has been on amlodipine 5 mg, lisinopril 40 mg, Toprol XL 25 mg. He states that his BP at home typically in the 269'S systolic.   Andrew [name]  resides at [facility] in independent living.  He is eating better, and swims at least 2-3 times per week and does exercise classes. He had blood work done by Andrew [name] in May 2018.  Glucose was mildly increased at 122.  Hemoglobin and hematocrit were stable.  He is normal renal function with a creatinine of 1.1.  LFTs were normal.  Total cholesterol was 165, triglycerides 89, HDL 36, and LDL was slightly increased at 111.  I had seen him in September 2018 and more recently in March 2019.  Since I last saw him, he has continued to feel  well.  He is unaware of any recurrent atrial fibrillation.  He has continued to use CPAP therapy with 100% compliance.  At his last office visit his weight was 203.  Since that time, he was told of having prediabetes and as result has purposefully lost weight and has lost approximately 25 pounds.  He denies palpitations.  He denies PND orthopnea.  He presents for evaluation.    Past Medical History:  Diagnosis Date  . Adenomatous colon polyp 10/1993  . Diverticulosis   . 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 Hepatitis B antibody positive   . Hypertension   . OSA treated with BiPAP   . PAF (paroxysmal atrial fibrillation) ([place])     Past Surgical History:  Procedure Laterality Date  . CARDIOVERSION  08/18/2011   Procedure: CARDIOVERSION;  Surgeon: Andrew Sine, MD;  Location: [place];  Service: Cardiovascular;  Laterality: N/A;  . CARDIOVERSION  11/22/2011   Procedure: CARDIOVERSION;  Surgeon: Andrew Sine, MD;  Location: [place];  Service: Cardiovascular;  Laterality: N/A;  . COLONOSCOPY    . KNEE ARTHROSCOPY    . LAPAROSCOPIC CHOLECYSTECTOMY  04/2009  . NM MYOCAR PERF WALL MOTION  05/08/2008   Normal  . TONSILLECTOMY    . US ECHOCARDIOGRAPHY  07/27/2011   Mild concentric LVH, LA & RA is moderately dilated, mild to mod MR.    No Known Allergies  Current Outpatient Medications  Medication Sig Dispense Refill  . alfuzosin (UROXATRAL) 10 MG 24 hr tablet Take 10 mg by mouth daily with breakfast.    . amLODipine (NORVASC) 5 MG tablet Take 5 mg by mouth daily.    . finasteride (PROSCAR) 5 MG tablet Take 5 mg by mouth daily.    [name] Kitchen lisinopril (PRINIVIL,ZESTRIL) 40 MG tablet Take 40 mg by mouth daily.    . metoprolol succinate (TOPROL-XL) 25 MG 24 hr tablet Alternate taking 25mg  and 12.5mg  daily 135 tablet 3  . propafenone (RYTHMOL) 150 MG tablet TAKE 1 TABLET (150 MG TOTAL) BY MOUTH EVERY 8 (EIGHT) HOURS. 270 tablet 3  . XARELTO 20 MG TABS tablet TAKE 1  TABLET BY MOUTH DAILY WITH SUPPER 90 tablet 1   No current facility-administered medications for this visit.     Social History   Socioeconomic History  . Marital status: Married    Spouse name: Not on file  . Number of children: Not on file  . Years of education: Not on file  . Highest education level: Not on file  Occupational History  . Occupation: Retired    Fish farm manager: RETIRED  Social Needs  . Financial resource strain: Not on file  . Food insecurity:    Worry: Not on file    Inability: Not on file  . Transportation needs:    Medical: Not on file    Non-medical: Not on file  Tobacco Use  . Smoking status: Former Research scientist (life sciences)  . Smokeless tobacco: Never Used  Substance and Sexual Activity  . Alcohol use: Yes    Alcohol/week: 0.0 standard drinks    Comment: 2 glasses if wine daily  . Drug use: No  . Sexual activity: Not on file  Lifestyle  . Physical activity:    Days per week: Not on file    Minutes per session: Not on file  . Stress: Not on file  Relationships  . Social connections:    Talks on phone: Not on file    Gets together: Not on file    Attends religious service: Not on file    Active member of club or organization: Not on file    Attends meetings of clubs or organizations: Not on file    Relationship status: Not on file  . Intimate partner violence:    Fear of current or ex partner: Not on file    Emotionally abused: Not on file    Physically abused: Not on file    Forced sexual activity: Not on file  Other Topics Concern  . Not on file  Social History Narrative  . Not on file    Family History  Problem Relation Age of Onset  . Esophageal cancer Father   . Emphysema Mother   . Stroke Andrew [name], he is retired. He is the owner of [employer]. He is married and has 3 children and 9 grandchildren. He does exercise. No tobacco use. He does drink occasional alcohol.  ROS General: Negative; No fevers, chills, or night sweats; positive  for fatigue HEENT: He will be undergoing cataract surgery; No changes in hearing, sinus congestion, difficulty swallowing Pulmonary: Negative; No cough, wheezing, shortness of breath, hemoptysis Cardiovascular: Negative; No chest pain, presyncope, syncope, palpitations GI: Negative; No nausea, vomiting, diarrhea, or abdominal pain GU: Negative; No dysuria, hematuria, or difficulty voiding Musculoskeletal: Negative; no myalgias, joint pain, or weakness Hematologic/Oncology: Positive for hemochromatosis undergoing monthly phlebotomies; no easy bruising, bleeding Endocrine: Negative; no heat/cold intolerance; no diabetes  Neuro: Negative; no changes in balance, headaches Skin: Negative; No rashes or skin lesions Psychiatric: Negative; No behavioral problems, depression Sleep: Positive for sleep apnea on CPAP therapy with 100% compliance.  No snoring, daytime sleepiness, hypersomnolence, bruxism, restless legs, hypnogognic hallucinations, no cataplexy Other comprehensive 14 point system review is negative.    PE BP (!) 142/84   Pulse 66   Ht 5\' 11"  (1.803 m)   Wt 178 lb (80.7 kg)   BMI 24.83 kg/m   Repeat blood pressure by me was 136/80  Wt Readings from Last 3 Encounters:  02/20/18 178 lb (80.7 kg)  08/30/17 203 lb (92.1 kg)  03/02/17 202 lb 12.8 oz (92 kg)   General: Alert, oriented, no distress.  Skin: normal turgor, no rashes, warm and dry HEENT: Normocephalic, atraumatic. Pupils equal round and reactive to light; sclera anicteric; extraocular muscles intact;  Nose without nasal septal hypertrophy Mouth/Parynx benign; Mallinpatti scale 3 Neck: No JVD, no carotid bruits; normal carotid upstroke Lungs: clear to ausculatation and percussion; no wheezing or rales Chest wall: without tenderness to palpitation Heart: PMI not displaced, RRR, s1 s2 normal, 1/6 systolic murmur, no diastolic murmur, no rubs, gallops, thrills, or heaves Abdomen: soft, nontender; no hepatosplenomehaly, BS+;  abdominal aorta nontender and not dilated by palpation. Back: no CVA tenderness Pulses 2+ Musculoskeletal: full range of motion, normal strength, no joint deformities Extremities: no clubbing cyanosis or edema, Homan's sign negative  Neurologic: grossly nonfocal; Cranial nerves grossly wnl Psychologic: Normal mood and affect  ECG (independently read by me): Sinus rhythm with sinus arrhythmia, PACs and PVC  March 2019 ECG (independently read by me): normal sinus rhythm at 60 bpm.  First-degree AV block.  PR interval 230 ms.  Left axis deviation.  September 2018 ECG (independently read by me): Sinus bradycardia 52 bpm.  First degree AV block with a PR interval 240 ms.  No ectopy.  Normal QTc interval.  March 2018 ECG (independently read by me): Sinus bradycardia 53 bpm.  First-degree AV block with a PR interval at 222 ms.  QTc interval 452 ms.  No signal ST-T changes.  September 2017 ECG (independently read by me): Normal sinus rhythm at 61 bpm.  First-degree AV block with a PR interval of 216 ms.  Nonspecific T changes.  September 2016 ECG (independently read by me): Sinus bradycardia 59 bpm.  First-degree AV block with a PR interval at 238 ms.  QTc interval 465 ms.  Incomplete right bundle branch block.  March 2016 ECG (independently read by me): NSR at 63 with first-degree AV block.   October 2015 ECG (independently read by me): Sinus bradycardia at 54 beats per minute with first-degree AV block.  Nonspecific T changes.   March 2015 ECG: Sinus bradycardia at 51 per minute. First degree AV block; QTc interval 458 ms  LABS: Laboratory from Dr. Pennie Banter office on 09/22/2015: Hemoglobin 16.1./Hematocrit 46.3/platelets 171, BUN 12, creatinine 0.9.  Serum 4.2.  LFTs normal. Cholesterol 193, triglycerides 92, HDL 47, LDL 128  Laboratory from 10/04/2016 was reviewed as noted above.  BMP Latest Ref Rng & Units 04/22/2009  Glucose 70 - 99 mg/dL 111(H)  BUN 6 - 23 mg/dL 9  Creatinine 0.4 -  1.5 mg/dL 0.78  Sodium 135 - 145 mEq/L 139  Potassium 3.5 - 5.1 mEq/L 4.1  Chloride 96 - 112 mEq/L 101  CO2 19 - 32 mEq/L 31  Calcium 8.4 - 10.5 mg/dL 9.5      Component Value Date/Time   PROT 7.0  07/07/2009 1448   ALBUMIN 4.0 07/07/2009 1448   AST 37 07/07/2009 1448   ALT 36 07/07/2009 1448   ALKPHOS 68 07/07/2009 1448   BILITOT 0.7 07/07/2009 1448   BILIDIR 0.2 07/07/2009 1448    CBC Latest Ref Rng & Units 12/03/2017 07/26/2017 04/24/2017  WBC 4.0 - 10.5 K/uL 7.0 7.6 7.1  Hemoglobin 13.0 - 17.0 g/dL 15.4 15.3 16.5  Hematocrit 39.0 - 52.0 % 44.6 44.2 48.4  Platelets 150.0 - 400.0 K/uL 173.0 186.0 186.0   Lab Results  Component Value Date   MCV 91.1 12/03/2017   MCV 91.2 07/26/2017   MCV 93.1 04/24/2017   Lipid Panel  No results found for: CHOL, TRIG, HDL, CHOLHDL, VLDL, LDLCALC, LDLDIRECT   Lab work done on 10/27/2014 by Andrew [name] were reviewed: Hemoglobin A1c 5.7.  Hemoglobin 16.5, hematocrit 47.0.  BUN 14, creatinine 1.2.  Normal liver function studies.  Total cluster 183, triglycerides 101, HDL 39, LDL 124.  Urinalysis negative.  RADIOLOGY: No results found.  IMPRESSION:  1. PAF (paroxysmal atrial fibrillation) ([place])   2. OSA (obstructive sleep apnea) on BiPAP   3. Anticoagulation adequate   4. Essential hypertension     ASSESSMENT AND PLAN: Mr. Serio a young appearing 84 year old white male who has a history of PAF and is maintaining sinus rhythm on his current medical regimen consisting of propofenone 150 mg every 8 hours in addition to  Toprol-XL 25 mg alternating with 12.5 mg every other day.  He is not having any episodes of dizziness or syncope.  He continues to be on Xarelto for anticoagulation and has not had any bleeding issues.  Blood pressure today is borderline increased on his regimen also consisting of amlodipine 5 mg, lisinopril 40 mg in addition to his Toprol.  His resting pulse is in the 60s.  He has lost 25 pounds since his last evaluation  from 203 pounds down to 178 pounds.  He continues to use CPAP with 100% compliant.  He continues to be on Xarelto without bleeding.  He will monitor his blood pressure.  I discussed with him optimal blood pressure is less than 130/80 and if his blood pressures remain mildly elevated further titration of amlodipine may be necessary.  Dr. Shelia Media will be rechecking laboratory.  I will see him in 6 months for reevaluation.   Andrew Sine, MD, [facility]  02/22/2018 11:11 PM

## 2018-02-22 ENCOUNTER — Encounter: Payer: Self-pay | Admitting: Cardiovascular Disease

## 2018-03-27 ENCOUNTER — Other Ambulatory Visit (INDEPENDENT_AMBULATORY_CARE_PROVIDER_SITE_OTHER): Payer: Medicare Other

## 2018-03-27 ENCOUNTER — Other Ambulatory Visit: Payer: Self-pay | Admitting: Emergency Medicine

## 2018-03-27 ENCOUNTER — Other Ambulatory Visit: Payer: Self-pay

## 2018-03-27 LAB — CBC WITH DIFFERENTIAL/PLATELET
Basophils Absolute: 0 10*3/uL (ref 0.0–0.1)
Basophils Relative: 0.6 % (ref 0.0–3.0)
EOS PCT: 0.9 % (ref 0.0–5.0)
Eosinophils Absolute: 0.1 10*3/uL (ref 0.0–0.7)
HCT: 42.2 % (ref 39.0–52.0)
Hemoglobin: 14.7 g/dL (ref 13.0–17.0)
Lymphocytes Relative: 17 % (ref 12.0–46.0)
Lymphs Abs: 1 10*3/uL (ref 0.7–4.0)
MCHC: 34.9 g/dL (ref 30.0–36.0)
MCV: 91 fl (ref 78.0–100.0)
MONO ABS: 0.5 10*3/uL (ref 0.1–1.0)
MONOS PCT: 8.4 % (ref 3.0–12.0)
NEUTROS ABS: 4.3 10*3/uL (ref 1.4–7.7)
NEUTROS PCT: 73.1 % (ref 43.0–77.0)
PLATELETS: 187 10*3/uL (ref 150.0–400.0)
RBC: 4.64 Mil/uL (ref 4.22–5.81)
RDW: 13.1 % (ref 11.5–15.5)
WBC: 5.9 10*3/uL (ref 4.0–10.5)

## 2018-03-27 LAB — IBC PANEL
IRON: 107 ug/dL (ref 42–165)
Saturation Ratios: 38.2 % (ref 20.0–50.0)
Transferrin: 200 mg/dL — ABNORMAL LOW (ref 212.0–360.0)

## 2018-03-27 LAB — FERRITIN: Ferritin: 79.2 ng/mL (ref 22.0–322.0)

## 2018-04-17 DIAGNOSIS — R7303 Prediabetes: Secondary | ICD-10-CM | POA: Diagnosis not present

## 2018-05-01 DIAGNOSIS — I10 Essential (primary) hypertension: Secondary | ICD-10-CM | POA: Diagnosis not present

## 2018-05-01 DIAGNOSIS — R7303 Prediabetes: Secondary | ICD-10-CM | POA: Diagnosis not present

## 2018-05-07 ENCOUNTER — Ambulatory Visit: Payer: Medicare Other | Admitting: Gastroenterology

## 2018-06-11 ENCOUNTER — Ambulatory Visit (INDEPENDENT_AMBULATORY_CARE_PROVIDER_SITE_OTHER): Payer: Medicare Other | Admitting: Gastroenterology

## 2018-06-11 ENCOUNTER — Encounter: Payer: Self-pay | Admitting: Gastroenterology

## 2018-06-11 NOTE — Progress Notes (Signed)
    History of Present Illness: This is an 85 year old male returning for the evaluation of hemochromatosis.  He was on an every 66-month schedule which was changed to every 3 months in July 2019 as his ferritin and iron studies have gradually improved.  He has no complaints today.  He states he intentionally lost 25 pounds and feels very well.  Last phlebotomy was October 2019: Ferritin=79, Fe=107, transferrin=200, sat %=38, Hbg=14.09 December 2017: Ferritin=55  Review of Systems: Pertinent positive and negative review of systems were noted in the above HPI section. All other review of systems were otherwise negative.  Current Medications, Allergies, Past Medical History, Past Surgical History, Family History and Social History were reviewed in [employer] record.  Physical Exam: General: Well developed, well nourished, no acute distress Head: Normocephalic and atraumatic Eyes:  sclerae anicteric, EOMI Ears: Normal auditory acuity Mouth: No deformity or lesions Neck: Supple, no masses or thyromegaly Lungs: Clear throughout to auscultation Heart: Regular rate and rhythm; no murmurs, rubs or bruits Abdomen: Soft, non tender and non distended. No masses, hepatosplenomegaly or hernias noted. Normal Bowel sounds Rectal: Not done Musculoskeletal: Symmetrical with no gross deformities  Skin: No lesions on visible extremities Pulses:  Normal pulses noted Extremities: No clubbing, cyanosis, edema or deformities noted Neurological: Alert oriented x 4, grossly nonfocal Cervical Nodes:  No significant cervical adenopathy Inguinal Nodes: No significant inguinal adenopathy Psychological:  Alert and cooperative. Normal mood and affect   Assessment and Recommendations:  1. Hemochromatosis. Currently on a q3 month phlebotomy schedule.  Ferritin and iron studies were in a very good range in the last half of 2019.  Ferritin trended slightly up in October.  Continue 71-month  phlebotomies for now.  Adjust phlebotomy schedule based on lab work.  We discussed the disease prognosis and treatment.  Addressed all his questions to his satisfaction. REV in 1 year.    cc: Deland Pretty, MD

## 2018-06-11 NOTE — Patient Instructions (Signed)
You will continue your phlebotomies every 3 months. You will be due this month.   Thank you for choosing me and [facility].  Pricilla Riffle. Dagoberto Ligas., MD., [facility]

## 2018-06-25 ENCOUNTER — Other Ambulatory Visit: Payer: Self-pay | Admitting: Cardiovascular Disease

## 2018-06-25 DIAGNOSIS — I480 Paroxysmal atrial fibrillation: Secondary | ICD-10-CM

## 2018-06-25 NOTE — Telephone Encounter (Signed)
Called pt to tell them I will refill 1 month and pt has to get bloodwork

## 2018-06-27 DIAGNOSIS — N401 Enlarged prostate with lower urinary tract symptoms: Secondary | ICD-10-CM | POA: Diagnosis not present

## 2018-06-27 DIAGNOSIS — R350 Frequency of micturition: Secondary | ICD-10-CM | POA: Diagnosis not present

## 2018-06-27 DIAGNOSIS — R351 Nocturia: Secondary | ICD-10-CM | POA: Diagnosis not present

## 2018-07-21 ENCOUNTER — Other Ambulatory Visit: Payer: Self-pay | Admitting: Cardiovascular Disease

## 2018-08-02 ENCOUNTER — Other Ambulatory Visit (INDEPENDENT_AMBULATORY_CARE_PROVIDER_SITE_OTHER): Payer: Medicare Other

## 2018-08-02 LAB — CBC WITH DIFFERENTIAL/PLATELET
Basophils Absolute: 0 10*3/uL (ref 0.0–0.1)
Basophils Relative: 0.7 % (ref 0.0–3.0)
EOS ABS: 0.1 10*3/uL (ref 0.0–0.7)
Eosinophils Relative: 1.8 % (ref 0.0–5.0)
HCT: 45.2 % (ref 39.0–52.0)
HEMOGLOBIN: 15.7 g/dL (ref 13.0–17.0)
LYMPHS PCT: 23.9 % (ref 12.0–46.0)
Lymphs Abs: 1.2 10*3/uL (ref 0.7–4.0)
MCHC: 34.7 g/dL (ref 30.0–36.0)
MCV: 91.4 fl (ref 78.0–100.0)
Monocytes Absolute: 0.5 10*3/uL (ref 0.1–1.0)
Monocytes Relative: 9.6 % (ref 3.0–12.0)
Neutro Abs: 3.3 10*3/uL (ref 1.4–7.7)
Neutrophils Relative %: 64 % (ref 43.0–77.0)
Platelets: 157 10*3/uL (ref 150.0–400.0)
RBC: 4.94 Mil/uL (ref 4.22–5.81)
RDW: 12.7 % (ref 11.5–15.5)
WBC: 5.1 10*3/uL (ref 4.0–10.5)

## 2018-08-02 LAB — FERRITIN: Ferritin: 50.3 ng/mL (ref 22.0–322.0)

## 2018-08-02 LAB — IBC PANEL
Iron: 85 ug/dL (ref 42–165)
Saturation Ratios: 30.4 % (ref 20.0–50.0)
Transferrin: 200 mg/dL — ABNORMAL LOW (ref 212.0–360.0)

## 2018-08-16 ENCOUNTER — Other Ambulatory Visit: Payer: Self-pay | Admitting: Cardiovascular Disease

## 2018-08-16 DIAGNOSIS — D1801 Hemangioma of skin and subcutaneous tissue: Secondary | ICD-10-CM | POA: Diagnosis not present

## 2018-08-16 DIAGNOSIS — D2271 Melanocytic nevi of right lower limb, including hip: Secondary | ICD-10-CM | POA: Diagnosis not present

## 2018-08-16 DIAGNOSIS — D225 Melanocytic nevi of trunk: Secondary | ICD-10-CM | POA: Diagnosis not present

## 2018-08-16 DIAGNOSIS — Z85828 Personal history of other malignant neoplasm of skin: Secondary | ICD-10-CM | POA: Diagnosis not present

## 2018-08-16 DIAGNOSIS — L817 Pigmented purpuric dermatosis: Secondary | ICD-10-CM | POA: Diagnosis not present

## 2018-08-16 DIAGNOSIS — L821 Other seborrheic keratosis: Secondary | ICD-10-CM | POA: Diagnosis not present

## 2018-08-16 DIAGNOSIS — D2272 Melanocytic nevi of left lower limb, including hip: Secondary | ICD-10-CM | POA: Diagnosis not present

## 2018-08-16 NOTE — Telephone Encounter (Signed)
Pt is a 85 yr old male who last saw Dr. Claiborne Billings on 02/20/2018. Last noted weight was 80.1Kg on 06/11/18. SCr at [facility] on 10/03/2017 was 1.0. CrCl is 18mL/min. Will refill Xarelto 20mg  QD.

## 2018-09-11 ENCOUNTER — Telehealth: Payer: Self-pay | Admitting: Cardiovascular Disease

## 2018-09-11 NOTE — Telephone Encounter (Signed)
Will route message regarding CPAP to Barry Brunner, our sleep coordinator, she will be able to better assist patient.  Thanks !

## 2018-09-11 NOTE — Telephone Encounter (Signed)
Can we try to put this patient back on the schedule for a virtual visit.

## 2018-09-11 NOTE — Telephone Encounter (Signed)
Spoke to Andrew [name]. Will have him scheduled on 4/15 at 8 AM with Dr. Claiborne Billings for video visit.     Virtual Visit Pre-Appointment Phone Call  Steps For Call:  1. Confirm consent - "In the setting of the current Covid19 crisis, you are scheduled for a (phone or video) visit with your provider on (date) at (time).  Just as we do with many in-office visits, in order for you to participate in this visit, we must obtain consent.  If you'd like, I can send this to your mychart (if signed up) or email for you to review.  Otherwise, I can obtain your verbal consent now.  All virtual visits are billed to your insurance company just like a normal visit would be.  By agreeing to a virtual visit, we'd like you to understand that the technology does not allow for your provider to perform an examination, and thus may limit your provider's ability to fully assess your condition.  Finally, though the technology is pretty good, we cannot assure that it will always work on either your or our end, and in the setting of a video visit, we may have to convert it to a phone-only visit.  In either situation, we cannot ensure that we have a secure connection.  Are you willing to proceed?"  2. Give patient instructions for WebEx download to smartphone as below if video visit  3. Advise patient to be prepared with any vital sign or heart rhythm information, their current medicines, and a piece of paper and pen handy for any instructions they may receive the day of their visit  4. Inform patient they will receive a phone call 15 minutes prior to their appointment time (may be from unknown caller ID) so they should be prepared to answer  5. Confirm that appointment type is correct in Epic appointment notes (video vs telephone)    TELEPHONE CALL NOTE  Andrew [name] has been deemed a candidate for a follow-up tele-health visit to limit community exposure during the Covid-19 pandemic. I spoke with the patient via phone to ensure  availability of phone/video source, confirm preferred email & phone number, and discuss instructions and expectations.  I reminded Andrew [name] to be prepared with any vital sign and/or heart rhythm information that could potentially be obtained via home monitoring, at the time of his visit. I reminded Andrew [name] to expect a phone call at the time of his visit if his visit.  Did the patient verbally acknowledge consent to treatment? Yes  [name] 09/11/2018 4:39 PM   DOWNLOADING THE [employer], go to [employer] and type in WebEx in the search bar. Andrew [name] [employer], the blue/green circle. The app is free but as with any other app downloads, their phone may require them to verify saved payment information or Apple password. The patient does NOT have to create an account.  - If Android, ask patient to go to [employer] and type in WebEx in the search bar. [place] [employer], the blue/green circle. The app is free but as with any other app downloads, their phone may require them to verify saved payment information or Android password. The patient does NOT have to create an account.   CONSENT FOR TELE-HEALTH VISIT - PLEASE REVIEW  I hereby voluntarily request, consent and authorize [facility] and its employed or contracted physicians, Engineer, materials, nurse practitioners or other licensed health care professionals (  the Practitioner), to provide me with telemedicine health care services (the "Services") as deemed necessary by the treating Practitioner. I acknowledge and consent to receive the Services by the Practitioner via telemedicine. I understand that the telemedicine visit will involve communicating with the Practitioner through live audiovisual communication technology and the disclosure of certain medical information by electronic transmission. I acknowledge that I have been given the opportunity to request an  in-person assessment or other available alternative prior to the telemedicine visit and am voluntarily participating in the telemedicine visit.  I understand that I have the right to withhold or withdraw my consent to the use of telemedicine in the course of my care at any time, without affecting my right to future care or treatment, and that the Practitioner or I may terminate the telemedicine visit at any time. I understand that I have the right to inspect all information obtained and/or recorded in the course of the telemedicine visit and may receive copies of available information for a reasonable fee.  I understand that some of the potential risks of receiving the Services via telemedicine include:  [name] Kitchen Delay or interruption in medical evaluation due to technological equipment failure or disruption; . Information transmitted may not be sufficient (e.g. poor resolution of images) to allow for appropriate medical decision making by the Practitioner; and/or  . In rare instances, security protocols could fail, causing a breach of personal health information.  Furthermore, I acknowledge that it is my responsibility to provide information about my medical history, conditions and care that is complete and accurate to the best of my ability. I acknowledge that Practitioner's advice, recommendations, and/or decision may be based on factors not within their control, such as incomplete or inaccurate data provided by me or distortions of diagnostic images or specimens that may result from electronic transmissions. I understand that the practice of medicine is not an exact science and that Practitioner makes no warranties or guarantees regarding treatment outcomes. I acknowledge that I will receive a copy of this consent concurrently upon execution via email to the email address I last provided but may also request a printed copy by calling the office of [place].    I understand that my insurance will be  billed for this visit.   I have read or had this consent read to me. . I understand the contents of this consent, which adequately explains the benefits and risks of the Services being provided via telemedicine.  . I have been provided ample opportunity to ask questions regarding this consent and the Services and have had my questions answered to my satisfaction. . I give my informed consent for the services to be provided through the use of telemedicine in my medical care  By participating in this telemedicine visit I agree to the above.

## 2018-09-11 NOTE — Telephone Encounter (Signed)
Noted, thank you

## 2018-09-11 NOTE — Telephone Encounter (Signed)
Dr Claiborne Billings has to do a virtual visit to document machine is getting ready to break and order a new one.

## 2018-09-11 NOTE — Telephone Encounter (Signed)
   Primary Cardiologist:  Shelva Majestic, MD   Patient contacted.  History reviewed.  No symptoms to suggest any unstable cardiac conditions.  Based on discussion, with current pandemic situation, we will be postponing this appointment for Andrew [name] with a plan for f/u in 4 wks or sooner if feasible/necessary.  If symptoms change, he has been instructed to contact our office.   Pt stated that his CPAP machine had a message on it that says it is "nearing the end of its life" or that it is getting old. Pt wanted to know what to do about that. I told pt I would send message to Almyra Free, LPN and Dr. Claiborne Billings to let them know and they can call him back. Pt verbalized understanding. He also stated no symptoms and is feeling well. He is in a nursing home and stated they are quarantined right now, but he does not have any covid 19 symptoms.  Routing to C19 CANCEL pool for tracking (P CV DIV CV19 CANCEL - reason for visit "other.") and assigning priority (1 = 4-6 wks, 2 = 6-12 wks, 3 = >12 wks).  [name]  09/11/2018 10:57 AM         .

## 2018-09-17 ENCOUNTER — Telehealth: Payer: Self-pay | Admitting: Cardiovascular Disease

## 2018-09-17 NOTE — Telephone Encounter (Signed)
smartphone [phone] my chart via text/consented to virual/ pre reg completed

## 2018-09-18 ENCOUNTER — Telehealth (INDEPENDENT_AMBULATORY_CARE_PROVIDER_SITE_OTHER): Payer: Medicare Other | Admitting: Cardiovascular Disease

## 2018-09-18 ENCOUNTER — Ambulatory Visit: Payer: Medicare Other | Admitting: Cardiovascular Disease

## 2018-09-18 ENCOUNTER — Encounter: Payer: Self-pay | Admitting: Cardiovascular Disease

## 2018-09-18 VITALS — BP 148/80 | HR 69 | Ht 70.0 in | Wt 171.4 lb

## 2018-09-18 DIAGNOSIS — Z7901 Long term (current) use of anticoagulants: Secondary | ICD-10-CM

## 2018-09-18 DIAGNOSIS — G4733 Obstructive sleep apnea (adult) (pediatric): Secondary | ICD-10-CM | POA: Diagnosis not present

## 2018-09-18 DIAGNOSIS — I10 Essential (primary) hypertension: Secondary | ICD-10-CM

## 2018-09-18 DIAGNOSIS — I480 Paroxysmal atrial fibrillation: Secondary | ICD-10-CM | POA: Diagnosis not present

## 2018-09-18 NOTE — Telephone Encounter (Signed)
-----   Message from Troy Sine, MD sent at 09/18/2018  8:44 AM EDT ----- Mariann Laster, please arrange for him to get a new ResMed BiPAP machine.  I believe this may be called ResMed air curve auto unit.  I do not know his current settings we should try to set the new machine up in a similar way with his current settings.  He continues to be 100% compliant.

## 2018-09-18 NOTE — Telephone Encounter (Signed)
Orders for replacement BIPAP machine faxed to [facility] ( formerly [facility])

## 2018-09-18 NOTE — Patient Instructions (Addendum)
Medication Instructions:  The current medical regimen is effective;  continue present plan and medications.  If you need a refill on your cardiac medications before your next appointment, please call your pharmacy.    Follow-Up: At [facility], you and your health needs are our priority.  As part of our continuing mission to provide you with exceptional heart care, we have created designated Provider Care Teams.  These Care Teams include your primary Cardiologist (physician) and Advanced Practice Providers (APPs -  Physician Assistants and Nurse Practitioners) who all work together to provide you with the care you need, when you need it. You will need a follow up appointment in 4 months. You may see Shelva Majestic, MD or one of the following Advanced Practice Providers on your designated Care Team: [place], [place] . Fabian Sharp, PA-C  Any Other Special Instructions Will Be Listed Below (If Applicable). We will order a new machine for you. We will be in touch.

## 2018-09-18 NOTE — Progress Notes (Signed)
Virtual Visit via Video Note   This visit type was conducted due to national recommendations for restrictions regarding the COVID-19 Pandemic (e.g. social distancing) in an effort to limit this patient's exposure and mitigate transmission in our community.  Due to his co-morbid illnesses, this patient is at least at moderate risk for complications without adequate follow up.  This format is felt to be most appropriate for this patient at this time.  All issues noted in this document were discussed and addressed.  A limited physical exam was performed with this format.  Please refer to the patient's chart for his consent to telehealth for [facility].   Evaluation Performed:  Follow-up visit  Date:  09/18/2018   ID:  Andrew [name], DOB Nov 02, 1930, MRN [id number]  Patient Location: Home Provider Location: Home  PCP:  Deland Pretty, MD  Cardiologist:  Shelva Majestic, MD  Electrophysiologist:  None   Chief Complaint: 10-month cardiology evaluation  History of Present Illness:    The patient does not have symptoms concerning for COVID-19 infection (fever, chills, cough, or new shortness of breath).   Andrew [name] is a 85 y.o. male who has a history of paroxysmal atrial fibrillation. He was initially found to be in atrial fibrillation  in February 2013 and was placed on Xarelto and beta blocker therapy. He developed recurrent atrial fibrillation after his initial cardioversion. He was found to have obstructive sleep apnea and prior to a second cardioversion CPAP/BiPAP was instituted. He has been on BiPAP therapy ever since. His second cardioversion June 2013 was successful and with treatment of his sleep apnea and propofenone/ toprol he has maintained sinus rhythm. His DME company is [employer]. He uses his BiPAP with 100% compliance. Typically he goes to bed approximately 8 PM and wakes up between 5:30 and 6 AM. His sleep is restorative. He is unaware breakthrough snoring. There is  no residual daytime sleepiness.  He has a history of hemachromatosis and has phlebotomies every 3 months.. He denies chest pain. He denies palpitations. He has a history of hypertension and has been on amlodipine 5 mg, lisinopril 40 mg, Toprol XL 25 mg. He states that his BP at home typically in the 315'Q systolic.   Mr. Andrew [name]  resides at [facility] in independent living.  He is eating better, and was swimming at least 2-3 times per week and does exercise classes. He had blood work done by Dr. Deland Pretty in May 2018.  Glucose was mildly increased at 122.  Hemoglobin and hematocrit were stable.  He is normal renal function with a creatinine of 1.1.  LFTs were normal.  Total cholesterol was 165, triglycerides 89, HDL 36, and LDL was slightly increased at 111.  I had seen him in September 2018,  March 2019 and last saw him in July 2019.  Over the past 7 months, he has continued to feel well.  His heart rhythm has remained stable.  He denies any awareness of palpitations.  He is unaware of recurrent atrial fibrillation.  He continues to use his BiPAP with 100% compliance and never sleeps without it.  His BiPAP machine is over 63 years old.  He recently had a message on his machine stating that it was approaching end of motor life.  He was concerned that his machine may soon fail to work and he is concerned about having to sleep without it.  He qualifies for a new machine based on the age greater than 5 years  and his 100% compliance.  He denies any breakthrough snoring.  He denies residual daytime sleepiness.  As long as he uses his BiPAP his machine his sleep is restorative.    He continues to see Dr. Junius Roads for his primary MD.  In the past he had purposefully lost weight when he was told of having prediabetes and had lost over 25 pounds.  He states he had had lab work done in the the end of 2019.  I will try to obtain these results.  He also continues to see Dr. Fuller Plan regarding his hemochromatosis.   A recent CBC 1 month ago was stable with a hemoglobin of 15.7 and hematocrit of 45.2.  He presents for evaluation.   Past Medical History:  Diagnosis Date   Adenomatous colon polyp 10/1993   Diverticulosis    Hemochromatosis    Compound Heterozygous for C282Y & H63D mutations   Hepatitis B antibody positive    Hypertension    OSA treated with BiPAP    PAF (paroxysmal atrial fibrillation) ([place])    Past Surgical History:  Procedure Laterality Date   CARDIOVERSION  08/18/2011   Procedure: CARDIOVERSION;  Surgeon: Troy Sine, MD;  Location: [facility];  Service: Cardiovascular;  Laterality: N/A;   CARDIOVERSION  11/22/2011   Procedure: CARDIOVERSION;  Surgeon: Troy Sine, MD;  Location: [facility] OR;  Service: Cardiovascular;  Laterality: N/A;   COLONOSCOPY     KNEE ARTHROSCOPY     LAPAROSCOPIC CHOLECYSTECTOMY  04/2009   NM MYOCAR PERF WALL MOTION  05/08/2008   Normal   TONSILLECTOMY     US ECHOCARDIOGRAPHY  07/27/2011   Mild concentric LVH, LA & RA is moderately dilated, mild to mod MR.     Current Meds  Medication Sig   alfuzosin (UROXATRAL) 10 MG 24 hr tablet Take 10 mg by mouth daily with breakfast.   amLODipine (NORVASC) 5 MG tablet Take 5 mg by mouth daily.   finasteride (PROSCAR) 5 MG tablet Take 5 mg by mouth daily.   lisinopril (PRINIVIL,ZESTRIL) 40 MG tablet Take 40 mg by mouth daily.   metoprolol succinate (TOPROL-XL) 25 MG 24 hr tablet Alternate taking 25mg  and 12.5mg  daily   XARELTO 20 MG TABS tablet TAKE 1 TABLET BY MOUTH DAILY WITH SUPPER     Allergies:   Patient has no known allergies.   Social History   Tobacco Use   Smoking status: Former Smoker   Smokeless tobacco: Never Used  Substance Use Topics   Alcohol use: Yes    Alcohol/week: 0.0 standard drinks    Comment: 2 glasses if wine daily   Drug use: No     Family Hx: The patient's family history includes Emphysema in his mother; Esophageal cancer in his father; Stroke in his  brother.  ROS:   General: Negative; No fevers, chills, or night sweats;  HEENT: Negative; No changes in vision or hearing, sinus congestion, difficulty swallowing Pulmonary: Negative; No cough, wheezing, shortness of breath, hemoptysis Cardiovascular: Hypertension relatively well controlled; no chest pain, presyncope, syncope, palpitations; no recurrent atrial fibrillation GI: Negative; No nausea, vomiting, diarrhea, or abdominal pain GU: Negative; No dysuria, hematuria, or difficulty voiding Musculoskeletal: Negative; no myalgias, joint pain, or weakness Hematologic/Oncology: Positive for hemochromatosis Endocrine: Negative; no heat/cold intolerance; no overt diabetes Neuro: Negative; no changes in balance, headaches Skin: Negative; No rashes or skin lesions Psychiatric: Negative; No behavioral problems, depression Sleep: Obstructive sleep apnea, on BiPAP therapy since 2013 with 100% compliance.  No breakthrough snoring  snoring, daytime sleepiness, hypersomnolence, bruxism, restless legs, hypnogognic hallucinations, no cataplexy Other comprehensive 14 point system review is negative.   Labs/Other Tests and Data Reviewed:    EKG: I personally reviewed his last ECG from February 20, 2018 which showed sinus rhythm with mild sinus arrhythmia with PAC and PVC.  Recent Labs: 08/02/2018: Hemoglobin 15.7; Platelets 157.0   Recent Lipid Panel No results found for: CHOL, TRIG, HDL, CHOLHDL, LDLCALC, LDLDIRECT  Wt Readings from Last 3 Encounters:  09/18/18 171 lb 6.4 oz (77.7 kg)  06/11/18 176 lb 8 oz (80.1 kg)  02/20/18 178 lb (80.7 kg)     Objective:    Vital Signs:  BP (!) 148/80    Pulse 69    Ht 5\' 10"  (1.778 m)    Wt 171 lb 6.4 oz (77.7 kg)    BMI 24.59 kg/m    His blood pressure last evening was 128/74 pulse in the mid 75s  Well nourished, well developed male in no acute distress. Breathing is not labored.  He is wearing glasses. HEENT appears unremarkable. There is no  JVD. He denies any wheezing. When asked to palpate his chest there is no chest tenderness By his palpation his heart rhythm is normal His abdomen is nontender.   He denies any swelling. Neurologically he appears grossly normal. There is normal cognition and will affect  ASSESSMENT & PLAN:    1.  PAF: By his report, he is maintaining a regular rhythm.  Presently he is unaware of any palpitations or arrhythmia.  He denies any recurrent episodes of atrial fibrillation.  This has been stable since he initiated BiPAP therapy back in 2013.  His rate is well controlled with Toprol 25 mg alternating with 12.5 mg every other day.  He continues to be on anticoagulation with Xarelto.  2.  Anticoagulation: Stable with Xarelto.  Most recent CBC is stable.  There is no bleeding.  3.  Essential hypertension: His blood pressure taken last evening was excellent at 128/71 although was mildly increased this morning.  He continues to take amlodipine 5 mg, lisinopril 40 mg, and Toprol.  He will continue to monitor his blood pressure and notify us if this is consistently greater than 140.  4.  Obstructive sleep apnea on BiPAP: He has done exceptionally well since starting therapy in 2013.  He has continued to have 100% compliance and basically has not slept without it since initiating therapy.  His machine is now over 90 years old.  He had recently received a warning message that it is approaching end of motor life.  I will set him up to receive a new ResMed air curve BiPAP unit.  We will need to set his new machine with his current settings.  At present I do not have this data.  His DME company had been [employer] which recently was brought out and is now called [employer].  We will place this order so that he can get a new machine prior to his present machine failing.  I will see him within 3 months following his new machine set up date for a face-to-face evaluation hopefully in the office.  5.  Hereditary  hemochromatosis: Phlebotomy now every 3 months, followed by Dr. Fuller Plan.  COVID-19 Education: The signs and symptoms of COVID-19 were discussed with the patient and how to seek care for testing (follow up with PCP or arrange E-visit).  The importance of social distancing was discussed today.  Time:   Today, I  have spent 30 minutes with the patient with telehealth technology discussing the above problems.     Medication Adjustments/Labs and Tests Ordered: Current medicines are reviewed at length with the patient today.  Concerns regarding medicines are outlined above.   Tests Ordered: No orders of the defined types were placed in this encounter.   Medication Changes: No orders of the defined types were placed in this encounter.   Disposition:  Follow up 3-4 months  Signed, Shelva Majestic, MD  09/18/2018 8:13 AM    [place]

## 2018-10-22 DIAGNOSIS — E785 Hyperlipidemia, unspecified: Secondary | ICD-10-CM | POA: Diagnosis not present

## 2018-10-22 DIAGNOSIS — R7303 Prediabetes: Secondary | ICD-10-CM | POA: Diagnosis not present

## 2018-10-22 DIAGNOSIS — I10 Essential (primary) hypertension: Secondary | ICD-10-CM | POA: Diagnosis not present

## 2018-10-25 DIAGNOSIS — I10 Essential (primary) hypertension: Secondary | ICD-10-CM | POA: Diagnosis not present

## 2018-10-25 DIAGNOSIS — G4733 Obstructive sleep apnea (adult) (pediatric): Secondary | ICD-10-CM | POA: Diagnosis not present

## 2018-10-25 DIAGNOSIS — I4891 Unspecified atrial fibrillation: Secondary | ICD-10-CM | POA: Diagnosis not present

## 2018-10-25 DIAGNOSIS — Z87891 Personal history of nicotine dependence: Secondary | ICD-10-CM | POA: Diagnosis not present

## 2018-10-25 DIAGNOSIS — Z7901 Long term (current) use of anticoagulants: Secondary | ICD-10-CM | POA: Diagnosis not present

## 2018-10-25 DIAGNOSIS — E785 Hyperlipidemia, unspecified: Secondary | ICD-10-CM | POA: Diagnosis not present

## 2018-10-31 ENCOUNTER — Other Ambulatory Visit (INDEPENDENT_AMBULATORY_CARE_PROVIDER_SITE_OTHER): Payer: Medicare Other

## 2018-10-31 LAB — CBC WITH DIFFERENTIAL/PLATELET
Basophils Absolute: 0 10*3/uL (ref 0.0–0.1)
Basophils Relative: 0.7 % (ref 0.0–3.0)
Eosinophils Absolute: 0.1 10*3/uL (ref 0.0–0.7)
Eosinophils Relative: 1.5 % (ref 0.0–5.0)
HCT: 44.9 % (ref 39.0–52.0)
Hemoglobin: 15.8 g/dL (ref 13.0–17.0)
Lymphocytes Relative: 19.1 % (ref 12.0–46.0)
Lymphs Abs: 1.2 10*3/uL (ref 0.7–4.0)
MCHC: 35.3 g/dL (ref 30.0–36.0)
MCV: 90.2 fl (ref 78.0–100.0)
Monocytes Absolute: 0.5 10*3/uL (ref 0.1–1.0)
Monocytes Relative: 8.3 % (ref 3.0–12.0)
Neutro Abs: 4.5 10*3/uL (ref 1.4–7.7)
Neutrophils Relative %: 70.4 % (ref 43.0–77.0)
Platelets: 177 10*3/uL (ref 150.0–400.0)
RBC: 4.98 Mil/uL (ref 4.22–5.81)
RDW: 12.8 % (ref 11.5–15.5)
WBC: 6.4 10*3/uL (ref 4.0–10.5)

## 2018-10-31 LAB — IBC PANEL
Iron: 90 ug/dL (ref 42–165)
Saturation Ratios: 32.5 % (ref 20.0–50.0)
Transferrin: 198 mg/dL — ABNORMAL LOW (ref 212.0–360.0)

## 2018-10-31 LAB — FERRITIN: Ferritin: 70.6 ng/mL (ref 22.0–322.0)

## 2018-12-07 ENCOUNTER — Other Ambulatory Visit: Payer: Self-pay | Admitting: Cardiovascular Disease

## 2018-12-07 DIAGNOSIS — I480 Paroxysmal atrial fibrillation: Secondary | ICD-10-CM

## 2018-12-09 ENCOUNTER — Other Ambulatory Visit: Payer: Self-pay

## 2018-12-09 DIAGNOSIS — I480 Paroxysmal atrial fibrillation: Secondary | ICD-10-CM | POA: Diagnosis not present

## 2018-12-09 NOTE — Telephone Encounter (Signed)
Pt is an 73 yom requesting xarelto 20mg , wt 80.7kg, scr 1(10/11/17 outdated), ccr  Will give 83mos refill and need recent labs

## 2018-12-10 LAB — COMPREHENSIVE METABOLIC PANEL
ALT: 20 IU/L (ref 0–44)
AST: 26 IU/L (ref 0–40)
Albumin/Globulin Ratio: 1.6 (ref 1.2–2.2)
Albumin: 4.2 g/dL (ref 3.6–4.6)
Alkaline Phosphatase: 78 IU/L (ref 39–117)
BUN/Creatinine Ratio: 19 (ref 10–24)
BUN: 16 mg/dL (ref 8–27)
Bilirubin Total: 0.8 mg/dL (ref 0.0–1.2)
CO2: 24 mmol/L (ref 20–29)
Calcium: 9.6 mg/dL (ref 8.6–10.2)
Chloride: 101 mmol/L (ref 96–106)
Creatinine, Ser: 0.86 mg/dL (ref 0.76–1.27)
GFR calc Af Amer: 91 mL/min/{1.73_m2} (ref >59)
GFR calc non Af Amer: 79 mL/min/{1.73_m2} (ref >59)
Globulin, Total: 2.6 g/dL (ref 1.5–4.5)
Glucose: 129 mg/dL — ABNORMAL HIGH (ref 65–99)
Potassium: 4.1 mmol/L (ref 3.5–5.2)
Sodium: 139 mmol/L (ref 134–144)
Total Protein: 6.8 g/dL (ref 6.0–8.5)

## 2018-12-17 ENCOUNTER — Telehealth: Payer: Self-pay

## 2018-12-17 NOTE — Telephone Encounter (Signed)
Called patient regarding appointment for this week Thursday with Dr.Kelly- advised that it was a virtual visit, patient verbalized understanding.   Patient aware will be called like last time and to have vitals ready.   Appointment changed to virtual not office.

## 2018-12-18 ENCOUNTER — Telehealth: Payer: Self-pay | Admitting: Cardiovascular Disease

## 2018-12-18 NOTE — Telephone Encounter (Signed)
I called pt to confirm his apt for 12-19-18 with Dr Claiborne Billings.        1. Confirm consent - "In the setting of the current Covid19 crisis, you are scheduled for a (phone or video) visit with your provider on (date) at (time).  Just as we do with many in-office visits, in order for you to participate in this visit, we must obtain consent.  If you'd like, I can send this to your mychart (if signed up) or email for you to review.  Otherwise, I can obtain your verbal consent now.  All virtual visits are billed to your insurance company just like a normal visit would be.  By agreeing to a virtual visit, we'd like you to understand that the technology does not allow for your provider to perform an examination, and thus may limit your provider's ability to fully assess your condition. If your provider identifies any concerns that need to be evaluated in person, we will make arrangements to do so.  Finally, though the technology is pretty good, we cannot assure that it will always work on either your or our end, and in the setting of a video visit, we may have to convert it to a phone-only visit.  In either situation, we cannot ensure that we have a secure connection.  Are you willing to proceed?" STAFF: Did the patient verbally acknowledge consent to telehealth visit? Document YES/NO here: Yes  .     FULL LENGTH CONSENT FOR TELE-HEALTH VISIT   I hereby voluntarily request, consent and authorize [facility] and its employed or contracted physicians, physician assistants, nurse practitioners or other licensed health care professionals (the Practitioner), to provide me with telemedicine health care services (the Services") as deemed necessary by the treating Practitioner. I acknowledge and consent to receive the Services by the Practitioner via telemedicine. I understand that the telemedicine visit will involve communicating with the Practitioner through live audiovisual communication technology and the  disclosure of certain medical information by electronic transmission. I acknowledge that I have been given the opportunity to request an in-person assessment or other available alternative prior to the telemedicine visit and am voluntarily participating in the telemedicine visit.  I understand that I have the right to withhold or withdraw my consent to the use of telemedicine in the course of my care at any time, without affecting my right to future care or treatment, and that the Practitioner or I may terminate the telemedicine visit at any time. I understand that I have the right to inspect all information obtained and/or recorded in the course of the telemedicine visit and may receive copies of available information for a reasonable fee.  I understand that some of the potential risks of receiving the Services via telemedicine include:   Delay or interruption in medical evaluation due to technological equipment failure or disruption;  Information transmitted may not be sufficient (e.g. poor resolution of images) to allow for appropriate medical decision making by the Practitioner; and/or   In rare instances, security protocols could fail, causing a breach of personal health information.  Furthermore, I acknowledge that it is my responsibility to provide information about my medical history, conditions and care that is complete and accurate to the best of my ability. I acknowledge that Practitioner's advice, recommendations, and/or decision may be based on factors not within their control, such as incomplete or inaccurate data provided by me or distortions of diagnostic images or specimens that may result from electronic transmissions. I understand that  the practice of medicine is not an exact science and that Practitioner makes no warranties or guarantees regarding treatment outcomes. I acknowledge that I will receive a copy of this consent concurrently upon execution via email to the email address I  last provided but may also request a printed copy by calling the office of [facility].    I understand that my insurance will be billed for this visit.   I have read or had this consent read to me.  I understand the contents of this consent, which adequately explains the benefits and risks of the Services being provided via telemedicine.   I have been provided ample opportunity to ask questions regarding this consent and the Services and have had my questions answered to my satisfaction.  I give my informed consent for the services to be provided through the use of telemedicine in my medical care  By participating in this telemedicine visit I agree to the above.

## 2018-12-19 ENCOUNTER — Telehealth (INDEPENDENT_AMBULATORY_CARE_PROVIDER_SITE_OTHER): Payer: Medicare Other | Admitting: Cardiovascular Disease

## 2018-12-19 ENCOUNTER — Encounter: Payer: Self-pay | Admitting: Cardiovascular Disease

## 2018-12-19 VITALS — BP 117/78 | HR 80 | Ht 70.0 in | Wt 173.0 lb

## 2018-12-19 DIAGNOSIS — Z7901 Long term (current) use of anticoagulants: Secondary | ICD-10-CM

## 2018-12-19 DIAGNOSIS — G4733 Obstructive sleep apnea (adult) (pediatric): Secondary | ICD-10-CM

## 2018-12-19 DIAGNOSIS — I10 Essential (primary) hypertension: Secondary | ICD-10-CM

## 2018-12-19 DIAGNOSIS — I480 Paroxysmal atrial fibrillation: Secondary | ICD-10-CM

## 2018-12-19 NOTE — Patient Instructions (Addendum)
Medication Instructions:  The current medical regimen is effective;  continue present plan and medications.  If you need a refill on your cardiac medications before your next appointment, please call your pharmacy.   Follow-Up: At [facility], you and your health needs are our priority.  As part of our continuing mission to provide you with exceptional heart care, we have created designated Provider Care Teams.  These Care Teams include your primary Cardiologist (physician) and Advanced Practice Providers (APPs -  Physician Assistants and Nurse Practitioners) who all work together to provide you with the care you need, when you need it. You will need a follow up appointment in 6 months(face to face).  Please call our office 2 months in advance to schedule this appointment.  You may see Shelva Majestic, MD or one of the following Advanced Practice Providers on your designated Care Team: [place], [place] . Fabian Sharp, PA-C

## 2018-12-19 NOTE — Progress Notes (Signed)
Virtual Visit via Telephone Note   This visit type was conducted due to national recommendations for restrictions regarding the COVID-19 Pandemic (e.g. social distancing) in an effort to limit this patient's exposure and mitigate transmission in our community.  Due to his co-morbid illnesses, this patient is at least at moderate risk for complications without adequate follow up.  This format is felt to be most appropriate for this patient at this time.  The patient did not have access to video technology/had technical difficulties with video requiring transitioning to audio format only (telephone).  All issues noted in this document were discussed and addressed.  No physical exam could be performed with this format.  Please refer to the patient's chart for his  consent to telehealth for [facility].   Date:  12/19/2018   ID:  Andrew [name], DOB 12-Nov-1930, MRN [id number]  Patient Location: Home Provider Location: Home  PCP:  Deland Pretty, MD  Cardiologist:  Shelva Majestic, MD  Electrophysiologist:  None   Evaluation Performed:  Follow-Up Visit  Chief Complaint:  4 month F/U  History of Present Illness:    Andrew [name] is a 85 y.o. male with  who has a history of paroxysmal atrial fibrillation. He was initially found to be in atrial fibrillation in February 2013 and was placed on Xarelto and beta blocker therapy. He developed recurrent atrial fibrillation after his initial cardioversion. He was found to have obstructive sleep apnea and prior to a second cardioversion CPAP/BiPAP was instituted. He has been on BiPAP therapy ever since. His second cardioversion June 2013 was successful and with treatment of his sleep apnea and propofenone/ toprol he has maintained sinus rhythm. His DME company is [employer]. He uses his BiPAP with 100% compliance. Typically he goes to bed approximately 8 PM and wakes up between 5:30 and 6 AM. His sleep is restorative. He is unaware breakthrough  snoring. There is no residual daytime sleepiness.  He has a history of hemachromatosis and has phlebotomies every 3 months.. He denies chest pain. He denies palpitations. He has a history of hypertension and has been on amlodipine 5 mg, lisinopril 40 mg, Toprol XL 25 mg. He states that his BP at home typically in the 841'Y systolic.   Andrew [name] resides at [facility] in independent living. He is eating better, and was swimming at least 2-3 times per week and does exercise classes. He had blood work done by Dr. Deland Pretty in May 2018. Glucose was mildly increased at 122. Hemoglobin and hematocrit were stable. He is normal renal function with a creatinine of 1.1. LFTs were normal. Total cholesterol was 165, triglycerides 89, HDL 36, and LDL was slightly increased at 111.  I had seen him in September 2018,  March 2019 and last saw him in July 2019.  Over the past 7 months, he has continued to feel well.  His heart rhythm has remained stable.  He denies any awareness of palpitations.  He is unaware of recurrent atrial fibrillation.  He continues to use his BiPAP with 100% compliance and never sleeps without it.  His BiPAP machine is over 39 years old.  He recently had a message on his machine stating that it was approaching end of motor life.  He was concerned that his machine may soon fail to work and he is concerned about having to sleep without it.  He qualifies for a new machine based on the age greater than 5 years and his 100%  compliance.  He denies any breakthrough snoring.  He denies residual daytime sleepiness.  As long as he uses his BiPAP his machine his sleep is restorative.    He continues to see Dr. Junius Roads for his primary MD.  In the past he had purposefully lost weight when he was told of having prediabetes and had lost over 25 pounds.    He also continues to see Dr. Fuller Plan regarding his hemochromatosis.  A recent CBC was stable with a hemoglobin of 15.7 and hematocrit of 45.2.     I last saw him in April 2020 and a telemetry medicine evaluation.  His BiPAP machine was over 37 years old and he had recently received a warning message that it was approaching end-of-life.  I do not have the specifics of his pressure settings.  I set him up to receive the new ResMed air curve BiPAP unit.  He is done well with his new machine.  His DME company is now [employer] which brought out advanced home care.  I obtained a download from his machine from June 15 through December 17, 2018.  He is 100% compliant and averaging 8 hours and 35 minutes of BiPAP use per night.  At his set pressure of 13/8 AHI is 5.7 but he is continued to have some central events with a central apnea index of 5.1.  He had undergone recent laboratory.  Renal function was stable.  He is unaware of any recurrent atrial fibrillation.  He is tolerating anticoagulation with stable hemoglobin.  His blood pressure has been stable.  He denies any leg swelling.  He is walking regularly.  He denies any exertional chest pain or dyspnea.  He presents for reevaluation.  The patient does not have symptoms concerning for COVID-19 infection (fever, chills, cough, or new shortness of breath).    Past Medical History:  Diagnosis Date   Adenomatous colon polyp 10/1993   Diverticulosis    Hemochromatosis    Compound Heterozygous for C282Y & H63D mutations   Hepatitis B antibody positive    Hypertension    OSA treated with BiPAP    PAF (paroxysmal atrial fibrillation) ([place])    Past Surgical History:  Procedure Laterality Date   CARDIOVERSION  08/18/2011   Procedure: CARDIOVERSION;  Surgeon: Troy Sine, MD;  Location: [place];  Service: Cardiovascular;  Laterality: N/A;   CARDIOVERSION  11/22/2011   Procedure: CARDIOVERSION;  Surgeon: Troy Sine, MD;  Location: [facility] OR;  Service: Cardiovascular;  Laterality: N/A;   COLONOSCOPY     KNEE ARTHROSCOPY     LAPAROSCOPIC CHOLECYSTECTOMY  04/2009   NM MYOCAR PERF WALL MOTION   05/08/2008   Normal   TONSILLECTOMY     US ECHOCARDIOGRAPHY  07/27/2011   Mild concentric LVH, LA & RA is moderately dilated, mild to mod MR.     Current Meds  Medication Sig   alfuzosin (UROXATRAL) 10 MG 24 hr tablet Take 10 mg by mouth daily with breakfast.   amLODipine (NORVASC) 5 MG tablet Take 5 mg by mouth daily.   finasteride (PROSCAR) 5 MG tablet Take 5 mg by mouth daily.   lisinopril (PRINIVIL,ZESTRIL) 40 MG tablet Take 40 mg by mouth daily.   metoprolol succinate (TOPROL-XL) 25 MG 24 hr tablet Alternate taking 25mg  and 12.5mg  daily   XARELTO 20 MG TABS tablet TAKE 1 TABLET BY MOUTH DAILY WITH SUPPER     Allergies:   Patient has no known allergies.   Social History  Tobacco Use   Smoking status: Former Smoker   Smokeless tobacco: Never Used  Substance Use Topics   Alcohol use: Yes    Alcohol/week: 0.0 standard drinks    Comment: 2 glasses if wine daily   Drug use: No     Family Hx: The patient's family history includes Emphysema in his mother; Esophageal cancer in his father; Stroke in his brother.  ROS:   Please see the history of present illness.    No fevers chills night sweats No change in vision or hearing or sense of smell No cough wheezing or shortness of breath Stable blood pressure No chest pain or awareness of arrhythmia No GI symptoms No myalgias or joint pain Positive for hemochromatosis with periodic phlebotomy No cold or heat intolerance Sleeping well with his new BiPAP unit.  No breakthrough snoring.  Sleep is restorative.  No residual daytime sleepiness  All other systems reviewed and are negative.   Prior CV studies:   The following studies were reviewed today:  A download was obtained from his new BiPAP unit from June 15 through December 17, 2018. Recent laboratory was reviewed   Labs/Other Tests and Data Reviewed:    EKG: I personally reviewed his ECG from February 20, 2018 which showed sinus rhythm with mild sinus  arrhythmia with PVC and PAC  Recent Labs: 10/31/2018: Hemoglobin 15.8; Platelets 177.0 12/09/2018: ALT 20; BUN 16; Creatinine, Ser 0.86; Potassium 4.1; Sodium 139   Recent Lipid Panel No results found for: CHOL, TRIG, HDL, CHOLHDL, LDLCALC, LDLDIRECT  Wt Readings from Last 3 Encounters:  12/19/18 173 lb (78.5 kg)  09/18/18 171 lb 6.4 oz (77.7 kg)  06/11/18 176 lb 8 oz (80.1 kg)     Objective:    Vital Signs:  BP 117/78    Pulse 80    Ht 5\' 10"  (1.778 m)    Wt 173 lb (78.5 kg)    BMI 24.82 kg/m    Since this was white phone visit I could not perform a physical exam However, vital signs are reviewed and stable Palpation of his pulse by the patient reveals this to be normal  No chest wall discomfort to palpation No rashes or bleeding No abdominal discomfort No leg swelling Normal affect and mood   ASSESSMENT & PLAN:    1. Obstructive sleep apnea: He has been on BiPAP therapy since 2013 and has continued to demonstrate excellent compliance and is not slept without it.  Since his last evaluation he received a new ResMed AirCurve 10 BiPAP unit.  A download confirms excellent compliance with 100% usage and average sleep at 8 hours and 35 minutes.  AHI however is mildly increased at 5.7 and he is having some central events with a central index of 5.1.  Currently his BiPAP is set at a fixed pressure of 13/8.  I am recommending slight adjustment and will increase his pressure to 14/8 which should improve his obstructive apneic events currently at 5.7/h.  At this time I will not increase his EPAP pressure.  2.  Essential hypertension: Blood pressure today is stable on his current regimen consisting of lisinopril 40 mg daily, Toprol-XL 25 mg alternating with 12.5 mg and amlodipine 5 mg daily.  He will continue with current therapy.  Recent creatinine 0.86.  3.  PAF: No recurrence on BiPAP therapy and with current dose of Toprol-XL.  4.  Anticoagulation: No bleeding on Xarelto.  Most recent CBC  stable.  5.  Hereditary hemochromatosis: Followed by Dr.  Fuller Plan with phlebotomies periodically.  COVID-19 Education: The signs and symptoms of COVID-19 were discussed with the patient and how to seek care for testing (follow up with PCP or arrange E-visit).  The importance of social distancing was discussed today.  Time:   Today, I have spent 20 minutes with the patient with telehealth technology discussing the above problems.     Medication Adjustments/Labs and Tests Ordered: Current medicines are reviewed at length with the patient today.  Concerns regarding medicines are outlined above.   Tests Ordered: No orders of the defined types were placed in this encounter.   Medication Changes: No orders of the defined types were placed in this encounter.   Follow Up: 6 months; in office evaluation  Signed, Shelva Majestic, MD  12/19/2018 1:12 PM     [facility]

## 2019-01-01 ENCOUNTER — Other Ambulatory Visit: Payer: Self-pay | Admitting: Cardiovascular Disease

## 2019-02-04 ENCOUNTER — Other Ambulatory Visit (INDEPENDENT_AMBULATORY_CARE_PROVIDER_SITE_OTHER): Payer: Medicare Other

## 2019-02-04 LAB — CBC WITH DIFFERENTIAL/PLATELET
Basophils Absolute: 0 10*3/uL (ref 0.0–0.1)
Basophils Relative: 0.6 % (ref 0.0–3.0)
Eosinophils Absolute: 0.2 10*3/uL (ref 0.0–0.7)
Eosinophils Relative: 2.7 % (ref 0.0–5.0)
HCT: 47.4 % (ref 39.0–52.0)
Hemoglobin: 16.4 g/dL (ref 13.0–17.0)
Lymphocytes Relative: 23.4 % (ref 12.0–46.0)
Lymphs Abs: 1.6 10*3/uL (ref 0.7–4.0)
MCHC: 34.6 g/dL (ref 30.0–36.0)
MCV: 92.2 fl (ref 78.0–100.0)
Monocytes Absolute: 0.6 10*3/uL (ref 0.1–1.0)
Monocytes Relative: 8.3 % (ref 3.0–12.0)
Neutro Abs: 4.5 10*3/uL (ref 1.4–7.7)
Neutrophils Relative %: 65 % (ref 43.0–77.0)
Platelets: 185 10*3/uL (ref 150.0–400.0)
RBC: 5.14 Mil/uL (ref 4.22–5.81)
RDW: 12.8 % (ref 11.5–15.5)
WBC: 6.9 10*3/uL (ref 4.0–10.5)

## 2019-02-04 LAB — FERRITIN: Ferritin: 67.6 ng/mL (ref 22.0–322.0)

## 2019-02-04 LAB — IBC PANEL
Iron: 185 ug/dL — ABNORMAL HIGH (ref 42–165)
Saturation Ratios: 62 % — ABNORMAL HIGH (ref 20.0–50.0)
Transferrin: 213 mg/dL (ref 212.0–360.0)

## 2019-02-06 ENCOUNTER — Other Ambulatory Visit: Payer: Self-pay

## 2019-02-22 DIAGNOSIS — Z23 Encounter for immunization: Secondary | ICD-10-CM | POA: Diagnosis not present

## 2019-03-28 ENCOUNTER — Other Ambulatory Visit: Payer: Self-pay | Admitting: Cardiovascular Disease

## 2019-03-31 NOTE — Telephone Encounter (Signed)
Refill request

## 2019-04-10 ENCOUNTER — Other Ambulatory Visit (INDEPENDENT_AMBULATORY_CARE_PROVIDER_SITE_OTHER): Payer: Medicare Other

## 2019-04-10 LAB — CBC
HCT: 45.5 % (ref 39.0–52.0)
Hemoglobin: 15.5 g/dL (ref 13.0–17.0)
MCHC: 34.2 g/dL (ref 30.0–36.0)
MCV: 92.8 fl (ref 78.0–100.0)
Platelets: 169 10*3/uL (ref 150.0–400.0)
RBC: 4.9 Mil/uL (ref 4.22–5.81)
RDW: 13.1 % (ref 11.5–15.5)
WBC: 7 10*3/uL (ref 4.0–10.5)

## 2019-04-10 LAB — IBC + FERRITIN
Ferritin: 70.9 ng/mL (ref 22.0–322.0)
Iron: 169 ug/dL — ABNORMAL HIGH (ref 42–165)
Saturation Ratios: 61.3 % — ABNORMAL HIGH (ref 20.0–50.0)
Transferrin: 197 mg/dL — ABNORMAL LOW (ref 212.0–360.0)

## 2019-05-08 ENCOUNTER — Other Ambulatory Visit (INDEPENDENT_AMBULATORY_CARE_PROVIDER_SITE_OTHER): Payer: Medicare Other

## 2019-05-08 LAB — CBC
HCT: 44.8 % (ref 39.0–52.0)
Hemoglobin: 15.4 g/dL (ref 13.0–17.0)
MCHC: 34.3 g/dL (ref 30.0–36.0)
MCV: 93.9 fl (ref 78.0–100.0)
Platelets: 176 10*3/uL (ref 150.0–400.0)
RBC: 4.77 Mil/uL (ref 4.22–5.81)
RDW: 13.3 % (ref 11.5–15.5)
WBC: 8.4 10*3/uL (ref 4.0–10.5)

## 2019-05-08 LAB — IBC + FERRITIN
Ferritin: 44 ng/mL (ref 22.0–322.0)
Iron: 191 ug/dL — ABNORMAL HIGH (ref 42–165)
Saturation Ratios: 68.6 % — ABNORMAL HIGH (ref 20.0–50.0)
Transferrin: 199 mg/dL — ABNORMAL LOW (ref 212.0–360.0)

## 2019-05-27 ENCOUNTER — Other Ambulatory Visit: Payer: Medicare Other

## 2019-06-09 DIAGNOSIS — Z23 Encounter for immunization: Secondary | ICD-10-CM | POA: Diagnosis not present

## 2019-06-22 ENCOUNTER — Other Ambulatory Visit: Payer: Self-pay | Admitting: Cardiovascular Disease

## 2019-06-30 DIAGNOSIS — N401 Enlarged prostate with lower urinary tract symptoms: Secondary | ICD-10-CM | POA: Diagnosis not present

## 2019-06-30 DIAGNOSIS — N5201 Erectile dysfunction due to arterial insufficiency: Secondary | ICD-10-CM | POA: Diagnosis not present

## 2019-06-30 DIAGNOSIS — R351 Nocturia: Secondary | ICD-10-CM | POA: Diagnosis not present

## 2019-07-07 DIAGNOSIS — Z23 Encounter for immunization: Secondary | ICD-10-CM | POA: Diagnosis not present

## 2019-07-11 ENCOUNTER — Other Ambulatory Visit (INDEPENDENT_AMBULATORY_CARE_PROVIDER_SITE_OTHER): Payer: Medicare Other

## 2019-07-11 LAB — CBC
HCT: 47.9 % (ref 39.0–52.0)
Hemoglobin: 16.2 g/dL (ref 13.0–17.0)
MCHC: 33.8 g/dL (ref 30.0–36.0)
MCV: 93.2 fl (ref 78.0–100.0)
Platelets: 159 10*3/uL (ref 150.0–400.0)
RBC: 5.14 Mil/uL (ref 4.22–5.81)
RDW: 12.8 % (ref 11.5–15.5)
WBC: 8.3 10*3/uL (ref 4.0–10.5)

## 2019-07-11 LAB — IBC + FERRITIN
Ferritin: 43.6 ng/mL (ref 22.0–322.0)
Iron: 107 ug/dL (ref 42–165)
Saturation Ratios: 38 % (ref 20.0–50.0)
Transferrin: 201 mg/dL — ABNORMAL LOW (ref 212.0–360.0)

## 2019-09-10 ENCOUNTER — Other Ambulatory Visit (INDEPENDENT_AMBULATORY_CARE_PROVIDER_SITE_OTHER): Payer: Medicare Other

## 2019-09-10 LAB — CBC
HCT: 44.9 % (ref 39.0–52.0)
Hemoglobin: 15.5 g/dL (ref 13.0–17.0)
MCHC: 34.6 g/dL (ref 30.0–36.0)
MCV: 92.4 fl (ref 78.0–100.0)
Platelets: 159 10*3/uL (ref 150.0–400.0)
RBC: 4.86 Mil/uL (ref 4.22–5.81)
RDW: 13.5 % (ref 11.5–15.5)
WBC: 7.4 10*3/uL (ref 4.0–10.5)

## 2019-09-10 LAB — IBC + FERRITIN
Ferritin: 36.5 ng/mL (ref 22.0–322.0)
Iron: 198 ug/dL — ABNORMAL HIGH (ref 42–165)
Saturation Ratios: 63.1 % — ABNORMAL HIGH (ref 20.0–50.0)
Transferrin: 224 mg/dL (ref 212.0–360.0)

## 2019-09-10 NOTE — Progress Notes (Unsigned)
amey

## 2019-09-16 ENCOUNTER — Other Ambulatory Visit: Payer: Self-pay | Admitting: Cardiovascular Disease

## 2019-10-23 DIAGNOSIS — D1801 Hemangioma of skin and subcutaneous tissue: Secondary | ICD-10-CM | POA: Diagnosis not present

## 2019-10-23 DIAGNOSIS — D225 Melanocytic nevi of trunk: Secondary | ICD-10-CM | POA: Diagnosis not present

## 2019-10-23 DIAGNOSIS — L570 Actinic keratosis: Secondary | ICD-10-CM | POA: Diagnosis not present

## 2019-10-23 DIAGNOSIS — D2271 Melanocytic nevi of right lower limb, including hip: Secondary | ICD-10-CM | POA: Diagnosis not present

## 2019-10-23 DIAGNOSIS — D2261 Melanocytic nevi of right upper limb, including shoulder: Secondary | ICD-10-CM | POA: Diagnosis not present

## 2019-10-23 DIAGNOSIS — L820 Inflamed seborrheic keratosis: Secondary | ICD-10-CM | POA: Diagnosis not present

## 2019-10-23 DIAGNOSIS — L817 Pigmented purpuric dermatosis: Secondary | ICD-10-CM | POA: Diagnosis not present

## 2019-10-23 DIAGNOSIS — L821 Other seborrheic keratosis: Secondary | ICD-10-CM | POA: Diagnosis not present

## 2019-10-23 DIAGNOSIS — D2221 Melanocytic nevi of right ear and external auricular canal: Secondary | ICD-10-CM | POA: Diagnosis not present

## 2019-10-23 DIAGNOSIS — Z85828 Personal history of other malignant neoplasm of skin: Secondary | ICD-10-CM | POA: Diagnosis not present

## 2019-10-28 DIAGNOSIS — I10 Essential (primary) hypertension: Secondary | ICD-10-CM | POA: Diagnosis not present

## 2019-10-28 DIAGNOSIS — E785 Hyperlipidemia, unspecified: Secondary | ICD-10-CM | POA: Diagnosis not present

## 2019-11-13 DIAGNOSIS — I10 Essential (primary) hypertension: Secondary | ICD-10-CM | POA: Diagnosis not present

## 2019-11-13 DIAGNOSIS — I4891 Unspecified atrial fibrillation: Secondary | ICD-10-CM | POA: Diagnosis not present

## 2019-11-13 DIAGNOSIS — Z0000 Encounter for general adult medical examination without abnormal findings: Secondary | ICD-10-CM | POA: Diagnosis not present

## 2019-11-13 DIAGNOSIS — Z7901 Long term (current) use of anticoagulants: Secondary | ICD-10-CM | POA: Diagnosis not present

## 2019-11-13 DIAGNOSIS — Z8546 Personal history of malignant neoplasm of prostate: Secondary | ICD-10-CM | POA: Diagnosis not present

## 2019-11-18 ENCOUNTER — Other Ambulatory Visit: Payer: Self-pay

## 2019-11-18 ENCOUNTER — Other Ambulatory Visit (INDEPENDENT_AMBULATORY_CARE_PROVIDER_SITE_OTHER): Payer: Medicare Other

## 2019-11-18 LAB — CBC WITH DIFFERENTIAL/PLATELET
Basophils Absolute: 0 10*3/uL (ref 0.0–0.1)
Basophils Relative: 0.5 % (ref 0.0–3.0)
Eosinophils Absolute: 0.1 10*3/uL (ref 0.0–0.7)
Eosinophils Relative: 1.6 % (ref 0.0–5.0)
HCT: 44.2 % (ref 39.0–52.0)
Hemoglobin: 15.5 g/dL (ref 13.0–17.0)
Lymphocytes Relative: 22.1 % (ref 12.0–46.0)
Lymphs Abs: 1.6 10*3/uL (ref 0.7–4.0)
MCHC: 35 g/dL (ref 30.0–36.0)
MCV: 91.8 fl (ref 78.0–100.0)
Monocytes Absolute: 0.5 10*3/uL (ref 0.1–1.0)
Monocytes Relative: 7.3 % (ref 3.0–12.0)
Neutro Abs: 4.8 10*3/uL (ref 1.4–7.7)
Neutrophils Relative %: 68.5 % (ref 43.0–77.0)
Platelets: 158 10*3/uL (ref 150.0–400.0)
RBC: 4.82 Mil/uL (ref 4.22–5.81)
RDW: 13 % (ref 11.5–15.5)
WBC: 7 10*3/uL (ref 4.0–10.5)

## 2019-11-19 ENCOUNTER — Other Ambulatory Visit: Payer: Self-pay

## 2020-02-24 DIAGNOSIS — Z23 Encounter for immunization: Secondary | ICD-10-CM | POA: Diagnosis not present

## 2020-02-25 ENCOUNTER — Other Ambulatory Visit (INDEPENDENT_AMBULATORY_CARE_PROVIDER_SITE_OTHER): Payer: Medicare Other

## 2020-02-25 LAB — CBC WITH DIFFERENTIAL/PLATELET
Basophils Absolute: 0 10*3/uL (ref 0.0–0.1)
Basophils Relative: 0.6 % (ref 0.0–3.0)
Eosinophils Absolute: 0.1 10*3/uL (ref 0.0–0.7)
Eosinophils Relative: 1.8 % (ref 0.0–5.0)
HCT: 46.8 % (ref 39.0–52.0)
Hemoglobin: 16.2 g/dL (ref 13.0–17.0)
Lymphocytes Relative: 17.8 % (ref 12.0–46.0)
Lymphs Abs: 1.4 10*3/uL (ref 0.7–4.0)
MCHC: 34.5 g/dL (ref 30.0–36.0)
MCV: 92.3 fl (ref 78.0–100.0)
Monocytes Absolute: 0.7 10*3/uL (ref 0.1–1.0)
Monocytes Relative: 9.5 % (ref 3.0–12.0)
Neutro Abs: 5.4 10*3/uL (ref 1.4–7.7)
Neutrophils Relative %: 70.3 % (ref 43.0–77.0)
Platelets: 174 10*3/uL (ref 150.0–400.0)
RBC: 5.08 Mil/uL (ref 4.22–5.81)
RDW: 12.9 % (ref 11.5–15.5)
WBC: 7.7 10*3/uL (ref 4.0–10.5)

## 2020-02-25 LAB — IBC + FERRITIN
Ferritin: 106.8 ng/mL (ref 22.0–322.0)
Iron: 194 ug/dL — ABNORMAL HIGH (ref 42–165)
Saturation Ratios: 71.1 % — ABNORMAL HIGH (ref 20.0–50.0)
Transferrin: 195 mg/dL — ABNORMAL LOW (ref 212.0–360.0)

## 2020-04-03 ENCOUNTER — Other Ambulatory Visit: Payer: Self-pay | Admitting: Cardiovascular Disease

## 2020-04-19 DIAGNOSIS — Z23 Encounter for immunization: Secondary | ICD-10-CM | POA: Diagnosis not present

## 2020-05-04 ENCOUNTER — Other Ambulatory Visit (INDEPENDENT_AMBULATORY_CARE_PROVIDER_SITE_OTHER): Payer: Medicare Other

## 2020-05-04 LAB — CBC
HCT: 45.3 % (ref 39.0–52.0)
Hemoglobin: 15.7 g/dL (ref 13.0–17.0)
MCHC: 34.7 g/dL (ref 30.0–36.0)
MCV: 92.3 fl (ref 78.0–100.0)
Platelets: 166 10*3/uL (ref 150.0–400.0)
RBC: 4.91 Mil/uL (ref 4.22–5.81)
RDW: 12.7 % (ref 11.5–15.5)
WBC: 7.2 10*3/uL (ref 4.0–10.5)

## 2020-05-05 ENCOUNTER — Other Ambulatory Visit: Payer: Self-pay

## 2020-06-18 DIAGNOSIS — N401 Enlarged prostate with lower urinary tract symptoms: Secondary | ICD-10-CM | POA: Diagnosis not present

## 2020-06-18 DIAGNOSIS — R351 Nocturia: Secondary | ICD-10-CM | POA: Diagnosis not present

## 2020-06-18 DIAGNOSIS — N5201 Erectile dysfunction due to arterial insufficiency: Secondary | ICD-10-CM | POA: Diagnosis not present

## 2020-08-10 ENCOUNTER — Other Ambulatory Visit (INDEPENDENT_AMBULATORY_CARE_PROVIDER_SITE_OTHER): Payer: Medicare Other

## 2020-08-10 LAB — CBC WITH DIFFERENTIAL/PLATELET
Basophils Absolute: 0.1 10*3/uL (ref 0.0–0.1)
Basophils Relative: 0.9 % (ref 0.0–3.0)
Eosinophils Absolute: 0.1 10*3/uL (ref 0.0–0.7)
Eosinophils Relative: 1.2 % (ref 0.0–5.0)
HCT: 41.8 % (ref 39.0–52.0)
Hemoglobin: 14.9 g/dL (ref 13.0–17.0)
Lymphocytes Relative: 19.5 % (ref 12.0–46.0)
Lymphs Abs: 1.3 10*3/uL (ref 0.7–4.0)
MCHC: 35.5 g/dL (ref 30.0–36.0)
MCV: 90.7 fl (ref 78.0–100.0)
Monocytes Absolute: 0.5 10*3/uL (ref 0.1–1.0)
Monocytes Relative: 7.4 % (ref 3.0–12.0)
Neutro Abs: 4.6 10*3/uL (ref 1.4–7.7)
Neutrophils Relative %: 71 % (ref 43.0–77.0)
Platelets: 152 10*3/uL (ref 150.0–400.0)
RBC: 4.61 Mil/uL (ref 4.22–5.81)
RDW: 13.1 % (ref 11.5–15.5)
WBC: 6.5 10*3/uL (ref 4.0–10.5)

## 2020-08-10 LAB — IBC + FERRITIN
Ferritin: 56.6 ng/mL (ref 22.0–322.0)
Iron: 225 ug/dL — ABNORMAL HIGH (ref 42–165)
Saturation Ratios: 82.8 % — ABNORMAL HIGH (ref 20.0–50.0)
Transferrin: 194 mg/dL — ABNORMAL LOW (ref 212.0–360.0)

## 2020-08-11 ENCOUNTER — Other Ambulatory Visit: Payer: Self-pay

## 2020-10-02 ENCOUNTER — Other Ambulatory Visit: Payer: Self-pay | Admitting: Cardiovascular Disease

## 2020-10-13 ENCOUNTER — Other Ambulatory Visit (INDEPENDENT_AMBULATORY_CARE_PROVIDER_SITE_OTHER): Payer: Medicare Other

## 2020-10-13 LAB — CBC
HCT: 47.2 % (ref 39.0–52.0)
Hemoglobin: 16.3 g/dL (ref 13.0–17.0)
MCHC: 34.6 g/dL (ref 30.0–36.0)
MCV: 92.2 fl (ref 78.0–100.0)
Platelets: 167 10*3/uL (ref 150.0–400.0)
RBC: 5.12 Mil/uL (ref 4.22–5.81)
RDW: 13 % (ref 11.5–15.5)
WBC: 6.9 10*3/uL (ref 4.0–10.5)

## 2020-10-13 LAB — IBC + FERRITIN
Ferritin: 64.9 ng/mL (ref 22.0–322.0)
Iron: 193 ug/dL — ABNORMAL HIGH (ref 42–165)
Saturation Ratios: 63.2 % — ABNORMAL HIGH (ref 20.0–50.0)
Transferrin: 218 mg/dL (ref 212.0–360.0)

## 2020-11-10 DIAGNOSIS — Z23 Encounter for immunization: Secondary | ICD-10-CM | POA: Diagnosis not present

## 2020-11-16 DIAGNOSIS — R7303 Prediabetes: Secondary | ICD-10-CM | POA: Diagnosis not present

## 2020-11-16 DIAGNOSIS — I10 Essential (primary) hypertension: Secondary | ICD-10-CM | POA: Diagnosis not present

## 2020-11-16 DIAGNOSIS — E7801 Familial hypercholesterolemia: Secondary | ICD-10-CM | POA: Diagnosis not present

## 2020-11-19 DIAGNOSIS — R7303 Prediabetes: Secondary | ICD-10-CM | POA: Diagnosis not present

## 2020-11-19 DIAGNOSIS — N401 Enlarged prostate with lower urinary tract symptoms: Secondary | ICD-10-CM | POA: Diagnosis not present

## 2020-11-19 DIAGNOSIS — I4891 Unspecified atrial fibrillation: Secondary | ICD-10-CM | POA: Diagnosis not present

## 2020-11-19 DIAGNOSIS — G4733 Obstructive sleep apnea (adult) (pediatric): Secondary | ICD-10-CM | POA: Diagnosis not present

## 2020-11-19 DIAGNOSIS — Z7901 Long term (current) use of anticoagulants: Secondary | ICD-10-CM | POA: Diagnosis not present

## 2020-11-19 DIAGNOSIS — D6869 Other thrombophilia: Secondary | ICD-10-CM | POA: Diagnosis not present

## 2020-11-19 DIAGNOSIS — I10 Essential (primary) hypertension: Secondary | ICD-10-CM | POA: Diagnosis not present

## 2020-11-19 DIAGNOSIS — E785 Hyperlipidemia, unspecified: Secondary | ICD-10-CM | POA: Diagnosis not present

## 2020-11-19 DIAGNOSIS — Z0001 Encounter for general adult medical examination with abnormal findings: Secondary | ICD-10-CM | POA: Diagnosis not present

## 2020-12-16 ENCOUNTER — Other Ambulatory Visit (INDEPENDENT_AMBULATORY_CARE_PROVIDER_SITE_OTHER): Payer: Medicare Other

## 2020-12-16 LAB — CBC
HCT: 45.5 % (ref 39.0–52.0)
Hemoglobin: 16.1 g/dL (ref 13.0–17.0)
MCHC: 35.3 g/dL (ref 30.0–36.0)
MCV: 89.4 fl (ref 78.0–100.0)
Platelets: 188 10*3/uL (ref 150.0–400.0)
RBC: 5.09 Mil/uL (ref 4.22–5.81)
RDW: 12.8 % (ref 11.5–15.5)
WBC: 8.2 10*3/uL (ref 4.0–10.5)

## 2020-12-16 LAB — IBC + FERRITIN
Ferritin: 74.6 ng/mL (ref 22.0–322.0)
Iron: 193 ug/dL — ABNORMAL HIGH (ref 42–165)
Saturation Ratios: 64.4 % — ABNORMAL HIGH (ref 20.0–50.0)
Transferrin: 214 mg/dL (ref 212.0–360.0)

## 2020-12-22 DIAGNOSIS — I10 Essential (primary) hypertension: Secondary | ICD-10-CM | POA: Diagnosis not present

## 2020-12-27 DIAGNOSIS — R7303 Prediabetes: Secondary | ICD-10-CM | POA: Diagnosis not present

## 2020-12-27 DIAGNOSIS — I10 Essential (primary) hypertension: Secondary | ICD-10-CM | POA: Diagnosis not present

## 2021-01-12 DIAGNOSIS — L821 Other seborrheic keratosis: Secondary | ICD-10-CM | POA: Diagnosis not present

## 2021-01-12 DIAGNOSIS — Z85828 Personal history of other malignant neoplasm of skin: Secondary | ICD-10-CM | POA: Diagnosis not present

## 2021-01-12 DIAGNOSIS — D692 Other nonthrombocytopenic purpura: Secondary | ICD-10-CM | POA: Diagnosis not present

## 2021-01-12 DIAGNOSIS — D1801 Hemangioma of skin and subcutaneous tissue: Secondary | ICD-10-CM | POA: Diagnosis not present

## 2021-01-12 DIAGNOSIS — L820 Inflamed seborrheic keratosis: Secondary | ICD-10-CM | POA: Diagnosis not present

## 2021-01-12 DIAGNOSIS — D2271 Melanocytic nevi of right lower limb, including hip: Secondary | ICD-10-CM | POA: Diagnosis not present

## 2021-01-12 DIAGNOSIS — B078 Other viral warts: Secondary | ICD-10-CM | POA: Diagnosis not present

## 2021-01-12 DIAGNOSIS — L570 Actinic keratosis: Secondary | ICD-10-CM | POA: Diagnosis not present

## 2021-01-12 DIAGNOSIS — D225 Melanocytic nevi of trunk: Secondary | ICD-10-CM | POA: Diagnosis not present

## 2021-02-02 ENCOUNTER — Other Ambulatory Visit: Payer: Self-pay

## 2021-03-10 DIAGNOSIS — Z23 Encounter for immunization: Secondary | ICD-10-CM | POA: Diagnosis not present

## 2021-03-27 DIAGNOSIS — Z23 Encounter for immunization: Secondary | ICD-10-CM | POA: Diagnosis not present

## 2021-03-31 ENCOUNTER — Other Ambulatory Visit: Payer: Self-pay | Admitting: Cardiovascular Disease

## 2021-03-31 NOTE — Telephone Encounter (Signed)
Pt hadn't been seen in 3 years refusing rx Prescription refill request for Xarelto received.  Indication:afib Last office visit:kelly 02/20/18 Weight:78.5kg Age:48m Scr:0.860 mg/ 12/09/2018 CrCl:

## 2021-04-01 ENCOUNTER — Other Ambulatory Visit: Payer: Self-pay | Admitting: Cardiovascular Disease

## 2021-04-01 NOTE — Telephone Encounter (Signed)
*  STAT* If patient is at the pharmacy, call can be transferred to refill team.   1. Which medications need to be refilled? (please list name of each medication and dose if known) XARELTO 20 MG TABS tablet  2. Which pharmacy/location (including street and city if local pharmacy) is medication to be sent to? [id number] - [place], [place] - [address]  3. Do they need a 30 day or 90 day supply? 30 day  ONLY HAS 2 PILLS LEFT

## 2021-04-01 NOTE — Telephone Encounter (Signed)
[facility] pt, has not seen cardiologist in over 2 years.

## 2021-04-05 ENCOUNTER — Other Ambulatory Visit: Payer: Self-pay | Admitting: Cardiovascular Disease

## 2021-04-05 DIAGNOSIS — I480 Paroxysmal atrial fibrillation: Secondary | ICD-10-CM

## 2021-04-05 NOTE — Telephone Encounter (Signed)
I spoke to Mr Andrew [name] and he told me he only has 2 days worth of pills of Xarelto 20 MG left. I explained to the Mr Andrew [name] he needs to make an appt. to see Dr Andrew [name] to have his prescription refilled because he has not been seen in 2 years. And Mr Andrew [name] asked if we can call him back to make the appt for him.

## 2021-04-05 NOTE — Telephone Encounter (Signed)
Routing to pharmd pool for permission to order cbc and bmet. Pt ccr is 56.7 based on labs that were done 10/22/19,overdue for appt w/MD. Pt asking for samples because he only has 2 days left.

## 2021-04-06 ENCOUNTER — Telehealth: Payer: Self-pay | Admitting: Cardiovascular Disease

## 2021-04-06 DIAGNOSIS — I480 Paroxysmal atrial fibrillation: Secondary | ICD-10-CM | POA: Diagnosis not present

## 2021-04-06 DIAGNOSIS — Z7901 Long term (current) use of anticoagulants: Secondary | ICD-10-CM | POA: Diagnosis not present

## 2021-04-06 NOTE — Telephone Encounter (Signed)
Patient of Dr. Claiborne Billings walked in to [facility] office requesting Xarelto refills. Upon review of chart, he was notified  on 11/1 by [facility] CMA (w/Pharmacy team) to come in for BMET/CBC to verify dose before refills given. Sarah RN spoke with patient and advised him that PharmD team will review labs and send refills accordingly tomorrow.

## 2021-04-07 ENCOUNTER — Other Ambulatory Visit: Payer: Self-pay | Admitting: Pharmacist Clinician (PhC)/ Clinical Pharmacy Specialist

## 2021-04-07 LAB — BASIC METABOLIC PANEL
BUN/Creatinine Ratio: 16 (ref 10–24)
BUN: 18 mg/dL (ref 8–27)
CO2: 27 mmol/L (ref 20–29)
Calcium: 9.6 mg/dL (ref 8.6–10.2)
Chloride: 93 mmol/L — ABNORMAL LOW (ref 96–106)
Creatinine, Ser: 1.16 mg/dL (ref 0.76–1.27)
Glucose: 111 mg/dL — ABNORMAL HIGH (ref 70–99)
Potassium: 3.9 mmol/L (ref 3.5–5.2)
Sodium: 133 mmol/L — ABNORMAL LOW (ref 134–144)
eGFR: 61 mL/min/{1.73_m2} (ref >59)

## 2021-04-07 LAB — CBC
Hematocrit: 43.9 % (ref 37.5–51.0)
Hemoglobin: 15.4 g/dL (ref 13.0–17.7)
MCH: 31.9 pg (ref 26.6–33.0)
MCHC: 35.1 g/dL (ref 31.5–35.7)
MCV: 91 fL (ref 79–97)
Platelets: 177 10*3/uL (ref 150–450)
RBC: 4.83 x10E6/uL (ref 4.14–5.80)
RDW: 12.2 % (ref 11.6–15.4)
WBC: 6.7 10*3/uL (ref 3.4–10.8)

## 2021-04-07 MED ORDER — RIVAROXABAN 15 MG PO TABS: 15.0000 mg | ORAL_TABLET | Freq: Every day | ORAL | 1 refills | Status: DC

## 2021-04-07 NOTE — Telephone Encounter (Signed)
89 M 80.1 kg, SCr 1.16, CrCl 50.  Will lower dose to 15 mg daily.  LMOM for patient, explaining lower dose

## 2021-04-12 NOTE — Telephone Encounter (Signed)
Already had labs, refill already sent in

## 2021-04-14 ENCOUNTER — Telehealth: Payer: Self-pay | Admitting: Gastroenterology

## 2021-04-14 NOTE — Telephone Encounter (Signed)
Patient notified that Dr. Fuller Plan would like to refer him to hematology to manage future phlebotomy.  Andrew [name] is aware that he will be contacted directly with an appointment date and time.

## 2021-04-14 NOTE — Telephone Encounter (Signed)
Patient notified that the [facility] no longer performs therapeutic phlebotomy.  He understands that I am waiting on Dr. Fuller Plan to decide if he would like to send him to the blood bank [facility] or to send him to hematology to manage.  He is aware I will let him know ASAP

## 2021-04-14 NOTE — Telephone Encounter (Signed)
Inbound call from patient requesting to speak with a nurse in regards to his phlebotomies.

## 2021-04-19 ENCOUNTER — Telehealth: Payer: Self-pay | Admitting: Hematology

## 2021-04-19 NOTE — Telephone Encounter (Signed)
Scheduled appt per 11/10 referral. Pt is aware of appt date and time.  

## 2021-05-11 ENCOUNTER — Inpatient Hospital Stay: Payer: Medicare Other | Attending: Hematology | Admitting: Hematology

## 2021-05-11 ENCOUNTER — Inpatient Hospital Stay: Payer: Medicare Other

## 2021-05-11 ENCOUNTER — Other Ambulatory Visit: Payer: Self-pay

## 2021-05-11 DIAGNOSIS — Z808 Family history of malignant neoplasm of other organs or systems: Secondary | ICD-10-CM

## 2021-05-11 DIAGNOSIS — G4733 Obstructive sleep apnea (adult) (pediatric): Secondary | ICD-10-CM | POA: Insufficient documentation

## 2021-05-11 DIAGNOSIS — Z87891 Personal history of nicotine dependence: Secondary | ICD-10-CM | POA: Insufficient documentation

## 2021-05-11 DIAGNOSIS — I10 Essential (primary) hypertension: Secondary | ICD-10-CM | POA: Diagnosis not present

## 2021-05-11 DIAGNOSIS — Z79899 Other long term (current) drug therapy: Secondary | ICD-10-CM | POA: Insufficient documentation

## 2021-05-11 DIAGNOSIS — I480 Paroxysmal atrial fibrillation: Secondary | ICD-10-CM | POA: Insufficient documentation

## 2021-05-11 DIAGNOSIS — Z7901 Long term (current) use of anticoagulants: Secondary | ICD-10-CM | POA: Diagnosis not present

## 2021-05-11 LAB — CMP (CANCER CENTER ONLY)
ALT: 35 U/L (ref 0–44)
AST: 36 U/L (ref 15–41)
Albumin: 4 g/dL (ref 3.5–5.0)
Alkaline Phosphatase: 73 U/L (ref 38–126)
Anion gap: 11 (ref 5–15)
BUN: 19 mg/dL (ref 8–23)
CO2: 27 mmol/L (ref 22–32)
Calcium: 9.3 mg/dL (ref 8.9–10.3)
Chloride: 99 mmol/L (ref 98–111)
Creatinine: 1.13 mg/dL (ref 0.61–1.24)
GFR, Estimated: >60 mL/min
Glucose, Bld: 101 mg/dL — ABNORMAL HIGH (ref 70–99)
Potassium: 3.5 mmol/L (ref 3.5–5.1)
Sodium: 137 mmol/L (ref 135–145)
Total Bilirubin: 1.6 mg/dL — ABNORMAL HIGH (ref 0.3–1.2)
Total Protein: 7.5 g/dL (ref 6.5–8.1)

## 2021-05-11 LAB — CBC WITH DIFFERENTIAL/PLATELET
Abs Immature Granulocytes: 0.02 10*3/uL (ref 0.00–0.07)
Basophils Absolute: 0 10*3/uL (ref 0.0–0.1)
Basophils Relative: 0 %
Eosinophils Absolute: 0.1 10*3/uL (ref 0.0–0.5)
Eosinophils Relative: 1 %
HCT: 43.2 % (ref 39.0–52.0)
Hemoglobin: 15.3 g/dL (ref 13.0–17.0)
Immature Granulocytes: 0 %
Lymphocytes Relative: 15 %
Lymphs Abs: 1 10*3/uL (ref 0.7–4.0)
MCH: 31.6 pg (ref 26.0–34.0)
MCHC: 35.4 g/dL (ref 30.0–36.0)
MCV: 89.3 fL (ref 80.0–100.0)
Monocytes Absolute: 0.6 10*3/uL (ref 0.1–1.0)
Monocytes Relative: 8 %
Neutro Abs: 5 10*3/uL (ref 1.7–7.7)
Neutrophils Relative %: 76 %
Platelets: 167 10*3/uL (ref 150–400)
RBC: 4.84 MIL/uL (ref 4.22–5.81)
RDW: 12.2 % (ref 11.5–15.5)
WBC: 6.8 10*3/uL (ref 4.0–10.5)
nRBC: 0 % (ref 0.0–0.2)

## 2021-05-11 LAB — IRON AND TIBC
Iron: 237 ug/dL — ABNORMAL HIGH (ref 42–163)
Saturation Ratios: 91 % — ABNORMAL HIGH (ref 20–55)
TIBC: 259 ug/dL (ref 202–409)
UIBC: 22 ug/dL — ABNORMAL LOW (ref 117–376)

## 2021-05-11 LAB — FERRITIN: Ferritin: 106 ng/mL (ref 24–336)

## 2021-05-11 NOTE — Progress Notes (Addendum)
[name]   HEMATOLOGY/ONCOLOGY CONSULTATION NOTE  Date of Service: 05/11/2021  Patient Care Team: Andrew Pretty, MD as PCP - General (Internal Medicine) Andrew Sine, MD as PCP - Cardiology (Cardiology)  CHIEF COMPLAINTS/PURPOSE OF CONSULTATION:  Transfer of care for Hereditary hemochromatosis for continued management.  HISTORY OF PRESENTING ILLNESS:   Andrew [name] is a wonderful 87 y.o. male who has been referred to [place] by Dr Andrew Edward MD for evaluation and management of hereditary hemochromatosis.  Patient has a history of hypertension, paroxysmal atrial fibrillation on reduced dose Xarelto at 15 mg daily, obstructive sleep apnea treated with BiPAP, hepatitis B antibody positive who apparently has a history of hereditary hemochromatosis and has been following with Dr. Fuller [name] for therapeutic phlebotomies every 3 months for nearly 10 years. He was having his therapeutic phlebotomies as Andrew [name] but notes that they have no longer do these and he was referred to continue having these done at Andrew [name].  Patient notes no significant issues with tolerating his therapeutic phlebotomies. Denies having significant alcohol use, iron supplementation or excessive red meat intake.  His last outside labs from 04/06/2021 showed normal CBC with a hemoglobin of 15.4 hematocrit of 43.9, WBC count of 6.7k and platelets of 177k BMP was unremarkable Ferritin was 74.6 with an iron saturation of 64.4. Patient notes his last therapeutic phlebotomy was about 3 to 4 months ago.  Patient denies any history of liver cirrhosis. He did have a liver biopsy on 06/23/2009 which showed chronic active hepatitis with increased iron.  MEDICAL HISTORY:  Past Medical History:  Diagnosis Date   Adenomatous colon polyp 10/1993   Diverticulosis    Hemochromatosis    Compound Heterozygous for C282Y & H63D mutations   Hepatitis B antibody positive    Hypertension    OSA treated with BiPAP     PAF (paroxysmal atrial fibrillation) (Andrew [name])     SURGICAL HISTORY: Past Surgical History:  Procedure Laterality Date   CARDIOVERSION  08/18/2011   Procedure: CARDIOVERSION;  Surgeon: Andrew Sine, MD;  Location: [place];  Service: Cardiovascular;  Laterality: N/A;   CARDIOVERSION  11/22/2011   Procedure: CARDIOVERSION;  Surgeon: Andrew Sine, MD;  Location: [place];  Service: Cardiovascular;  Laterality: N/A;   COLONOSCOPY     KNEE ARTHROSCOPY     LAPAROSCOPIC CHOLECYSTECTOMY  04/2009   NM MYOCAR PERF WALL MOTION  05/08/2008   Normal   TONSILLECTOMY     US ECHOCARDIOGRAPHY  07/27/2011   Mild concentric LVH, LA & RA is moderately dilated, mild to mod MR.    SOCIAL HISTORY: Social History   Socioeconomic History   Marital status: Married    Spouse name: Not on file   Number of children: Not on file   Years of education: Not on file   Highest education level: Not on file  Occupational History   Occupation: Retired    Fish farm manager: RETIRED  Tobacco Use   Smoking status: Former   Smokeless tobacco: Never  Substance and Sexual Activity   Alcohol use: Yes    Alcohol/week: 0.0 standard drinks    Comment: 2 glasses if wine daily   Drug use: No   Sexual activity: Not on file  Other Topics Concern   Not on file  Social History Narrative   Not on file   Social Determinants of Health   Financial Resource Strain: Not on file  Food Insecurity: Not on file  Transportation Needs: Not on file  Physical Activity: Not on file  Stress: Not on file  Social Connections: Not on file  Intimate Partner Violence: Not on file    FAMILY HISTORY: Family History  Problem Relation Age of Onset   Esophageal cancer Father    Emphysema Mother    Stroke Brother     ALLERGIES:  has No Known Allergies.  MEDICATIONS:  Current Outpatient Medications  Medication Sig Dispense Refill   alfuzosin (UROXATRAL) 10 MG 24 hr tablet Take 10 mg by mouth daily with breakfast.     amLODipine (NORVASC) 5 MG  tablet Take 5 mg by mouth daily.     finasteride (PROSCAR) 5 MG tablet Take 5 mg by mouth daily.     lisinopril (PRINIVIL,ZESTRIL) 40 MG tablet Take 40 mg by mouth daily.     metoprolol succinate (TOPROL-XL) 25 MG 24 hr tablet Alternate taking 25mg  and 12.5mg  daily 135 tablet 3   propafenone (RYTHMOL) 150 MG tablet TAKE 1 TABLET (150 MG TOTAL) BY MOUTH EVERY 8 (EIGHT) HOURS. (Patient not taking: Reported on 09/18/2018) 270 tablet 3   Rivaroxaban (XARELTO) 15 MG TABS tablet Take 1 tablet (15 mg total) by mouth daily with supper. 90 tablet 1   No current facility-administered medications for this visit.    REVIEW OF SYSTEMS:    .10 Point review of Systems was done is negative except as noted above.   PHYSICAL EXAMINATION: ECOG PERFORMANCE STATUS:   . Vitals:   05/11/21 1307  BP: 131/73  Pulse: 80  Resp: 20  Temp: (!) 97.2 F (36.2 C)  SpO2: 98%   Filed Weights   05/11/21 1307  Weight: 192 lb 11.2 oz (87.4 kg)   .Body mass index is 27.65 kg/m.  GENERAL:alert, in no acute distress and comfortable SKIN: no acute rashes, no significant lesions EYES: conjunctiva are pink and non-injected, sclera anicteric OROPHARYNX: MMM, no exudates, no oropharyngeal erythema or ulceration NECK: supple, no JVD LYMPH:  no palpable lymphadenopathy in the cervical, axillary or inguinal regions LUNGS: clear to auscultation b/l with normal respiratory effort HEART: regular rate & rhythm ABDOMEN:  normoactive bowel sounds , non tender, not distended. Extremity: no pedal edema PSYCH: alert & oriented x 3 with fluent speech NEURO: no focal motor/sensory deficits  LABORATORY DATA:  I have reviewed the data as listed  . CBC Latest Ref Rng & Units 05/11/2021 04/06/2021 12/16/2020  WBC 4.0 - 10.5 K/uL 6.8 6.7 8.2  Hemoglobin 13.0 - 17.0 g/dL 15.3 15.4 16.1  Hematocrit 39.0 - 52.0 % 43.2 43.9 45.5  Platelets 150 - 400 K/uL 167 177 188.0    . CMP Latest Ref Rng & Units 05/11/2021 04/06/2021 12/09/2018   Glucose 70 - 99 mg/dL 101(H) 111(H) 129(H)  BUN 8 - 23 mg/dL 19 18 16   Creatinine 0.61 - 1.24 mg/dL 1.13 1.16 0.86  Sodium 135 - 145 mmol/L 137 133(L) 139  Potassium 3.5 - 5.1 mmol/L 3.5 3.9 4.1  Chloride 98 - 111 mmol/L 99 93(L) 101  CO2 22 - 32 mmol/L 27 27 24   Calcium 8.9 - 10.3 mg/dL 9.3 9.6 9.6  Total Protein 6.5 - 8.1 g/dL 7.5 - 6.8  Total Bilirubin 0.3 - 1.2 mg/dL 1.6(H) - 0.8  Alkaline Phos 38 - 126 U/L 73 - 78  AST 15 - 41 U/L 36 - 26  ALT 0 - 44 U/L 35 - 20   . Lab Results  Component Value Date   IRON 237 (H) 05/11/2021   TIBC 259 05/11/2021   IRONPCTSAT  91 (H) 05/11/2021   (Iron and TIBC)  Lab Results  Component Value Date   FERRITIN 106 05/11/2021     RADIOGRAPHIC STUDIES: I have personally reviewed the radiological images as listed and agreed with the findings in the report. No results found.  ASSESSMENT & [name]:   87 year old very pleasant gentleman with  1) Hereditary hemochromatosis with compound heterozygous state for C282Y and H63D mutations 2) history of chronic active hepatitis and Iron overload on liver biopsy in 2011. Currently LFTs wnl. [name] -Discussed available labs in details with the patient -His CBC and CMP are unremarkable today ferritin is at 106 with an iron saturation of 91%. -Have sent out HFE gene mutation testing to confirm his mutation profile which is not available in the system. -He is already aware about genetic predisposition of his children to at least have a carrier state and he notes that they have been tested. -He has tolerated therapy phlebotomies well without any acute issues or difficulties with tolerating this.  No issues with anemia.  No orthostatic dizziness. -We shall continue his therapeutic phlebotomies every 3 to 4 months to keep his ferritin between 50 and 100 and iron saturation less than or equal to 60%. -With me in 6 months to monitor labs and adjust therapeutic phlebotomies. -We will defer liver specific  evaluation to Dr. Fuller [name] to determine if the patient needs ultrasound with elastography to evaluate for liver cirrhosis. -Counseled patient about limiting alcohol use and avoiding any iron supplementation and to limit the intake of red meat.  FOLLOWUP Please schedule therapeutic phlebotomy every 3 months starting within 1 week x 4 with labs MD visit in 6 months with third schedule therapeutic phlebotomy  All of the patients questions were answered with apparent satisfaction. The patient knows to call the clinic with any problems, questions or concerns.  I spent 35 mins counseling the patient face to face. The total time spent in the appointment was 47 mins and more than 50% was on counseling and direct patient cares, reviewing previous records and counseling regarding hereditary hemochromatosis    Sullivan Lone MD MS AAHIVMS [facility] Hematology/Oncology Physician [facility]

## 2021-05-18 ENCOUNTER — Encounter: Payer: Self-pay | Admitting: Hematology

## 2021-05-19 ENCOUNTER — Telehealth: Payer: Self-pay | Admitting: Hematology

## 2021-05-19 LAB — HEMOCHROMATOSIS DNA-PCR(C282Y,H63D)

## 2021-05-19 NOTE — Telephone Encounter (Signed)
Scheduled per sch msg. Called and left msg  

## 2021-05-25 ENCOUNTER — Other Ambulatory Visit: Payer: Self-pay

## 2021-05-25 ENCOUNTER — Inpatient Hospital Stay: Payer: Medicare Other

## 2021-05-25 DIAGNOSIS — Z7901 Long term (current) use of anticoagulants: Secondary | ICD-10-CM | POA: Diagnosis not present

## 2021-05-25 DIAGNOSIS — G4733 Obstructive sleep apnea (adult) (pediatric): Secondary | ICD-10-CM | POA: Diagnosis not present

## 2021-05-25 DIAGNOSIS — Z79899 Other long term (current) drug therapy: Secondary | ICD-10-CM | POA: Diagnosis not present

## 2021-05-25 DIAGNOSIS — I10 Essential (primary) hypertension: Secondary | ICD-10-CM | POA: Diagnosis not present

## 2021-05-25 DIAGNOSIS — I480 Paroxysmal atrial fibrillation: Secondary | ICD-10-CM | POA: Diagnosis not present

## 2021-05-25 LAB — CBC WITH DIFFERENTIAL (CANCER CENTER ONLY)
Abs Immature Granulocytes: 0.02 10*3/uL (ref 0.00–0.07)
Basophils Absolute: 0 10*3/uL (ref 0.0–0.1)
Basophils Relative: 0 %
Eosinophils Absolute: 0.1 10*3/uL (ref 0.0–0.5)
Eosinophils Relative: 2 %
HCT: 41.7 % (ref 39.0–52.0)
Hemoglobin: 15 g/dL (ref 13.0–17.0)
Immature Granulocytes: 0 %
Lymphocytes Relative: 16 %
Lymphs Abs: 1.1 10*3/uL (ref 0.7–4.0)
MCH: 32.2 pg (ref 26.0–34.0)
MCHC: 36 g/dL (ref 30.0–36.0)
MCV: 89.5 fL (ref 80.0–100.0)
Monocytes Absolute: 0.5 10*3/uL (ref 0.1–1.0)
Monocytes Relative: 8 %
Neutro Abs: 5.1 10*3/uL (ref 1.7–7.7)
Neutrophils Relative %: 74 %
Platelet Count: 168 10*3/uL (ref 150–400)
RBC: 4.66 MIL/uL (ref 4.22–5.81)
RDW: 12.2 % (ref 11.5–15.5)
WBC Count: 6.9 10*3/uL (ref 4.0–10.5)
nRBC: 0 % (ref 0.0–0.2)

## 2021-05-25 LAB — CMP (CANCER CENTER ONLY)
ALT: 33 U/L (ref 0–44)
AST: 35 U/L (ref 15–41)
Albumin: 4.2 g/dL (ref 3.5–5.0)
Alkaline Phosphatase: 66 U/L (ref 38–126)
Anion gap: 6 (ref 5–15)
BUN: 20 mg/dL (ref 8–23)
CO2: 30 mmol/L (ref 22–32)
Calcium: 9.1 mg/dL (ref 8.9–10.3)
Chloride: 100 mmol/L (ref 98–111)
Creatinine: 1.02 mg/dL (ref 0.61–1.24)
GFR, Estimated: >60 mL/min (ref >60)
Glucose, Bld: 135 mg/dL — ABNORMAL HIGH (ref 70–99)
Potassium: 3.8 mmol/L (ref 3.5–5.1)
Sodium: 136 mmol/L (ref 135–145)
Total Bilirubin: 1.5 mg/dL — ABNORMAL HIGH (ref 0.3–1.2)
Total Protein: 7.3 g/dL (ref 6.5–8.1)

## 2021-05-25 NOTE — Patient Instructions (Signed)

## 2021-05-25 NOTE — Progress Notes (Signed)
Andrew [name] presents today for phlebotomy per MD orders. Phlebotomy procedure started at 1505 and ended at 1514. 16G phlebotomy kit used in LAC.  504 grams removed. Patient observed for 20 minutes after procedure without any incident. Pt declined to stay for full 30 minutes. VSS at discharge.  Food and beverage offered. Patient tolerated procedure well. IV needle removed intact.

## 2021-05-26 LAB — IRON AND IRON BINDING CAPACITY (CC-WL,HP ONLY)
Iron: 217 ug/dL — ABNORMAL HIGH (ref 45–182)
Saturation Ratios: 82 % — ABNORMAL HIGH (ref 17.9–39.5)
TIBC: 265 ug/dL (ref 250–450)
UIBC: 48 ug/dL — ABNORMAL LOW (ref 117–376)

## 2021-05-26 LAB — FERRITIN: Ferritin: 116 ng/mL (ref 24–336)

## 2021-06-30 DIAGNOSIS — N401 Enlarged prostate with lower urinary tract symptoms: Secondary | ICD-10-CM | POA: Diagnosis not present

## 2021-06-30 DIAGNOSIS — R351 Nocturia: Secondary | ICD-10-CM | POA: Diagnosis not present

## 2021-07-12 DIAGNOSIS — R519 Headache, unspecified: Secondary | ICD-10-CM | POA: Diagnosis not present

## 2021-07-12 DIAGNOSIS — J029 Acute pharyngitis, unspecified: Secondary | ICD-10-CM | POA: Diagnosis not present

## 2021-07-15 DIAGNOSIS — M25511 Pain in right shoulder: Secondary | ICD-10-CM | POA: Diagnosis not present

## 2021-07-15 DIAGNOSIS — M542 Cervicalgia: Secondary | ICD-10-CM | POA: Diagnosis not present

## 2021-07-18 ENCOUNTER — Other Ambulatory Visit: Payer: Self-pay

## 2021-07-18 ENCOUNTER — Ambulatory Visit (INDEPENDENT_AMBULATORY_CARE_PROVIDER_SITE_OTHER): Payer: Medicare Other | Admitting: Family Medicine

## 2021-07-18 ENCOUNTER — Ambulatory Visit: Payer: Self-pay

## 2021-07-18 ENCOUNTER — Ambulatory Visit (INDEPENDENT_AMBULATORY_CARE_PROVIDER_SITE_OTHER): Payer: Medicare Other

## 2021-07-18 VITALS — BP 132/78 | HR 57 | Ht 70.0 in | Wt 193.6 lb

## 2021-07-18 DIAGNOSIS — M25511 Pain in right shoulder: Secondary | ICD-10-CM | POA: Diagnosis not present

## 2021-07-18 DIAGNOSIS — M542 Cervicalgia: Secondary | ICD-10-CM

## 2021-07-18 DIAGNOSIS — S0990XA Unspecified injury of head, initial encounter: Secondary | ICD-10-CM

## 2021-07-18 DIAGNOSIS — M2578 Osteophyte, vertebrae: Secondary | ICD-10-CM | POA: Diagnosis not present

## 2021-07-18 NOTE — Progress Notes (Signed)
I, Andrew [name], LAT, ATC acting as a scribe for Andrew Leader, MD.  Subjective:    CC: R shoulder and neck pain  HPI: Pt is an 88 y/o male R shoulder and neck pain after suffering a fall 3 weeks ago. Several days after the fall is when the pt first noticed the pain in his R shoulder. Pt did not recall hitting his head or LOC. Pt locates pain to the posterior aspect within the R trapz and R-side of the neck.  He tripped and fell over backwards.  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  He does not think he lost consciousness but has had a headache felt a little dazed.  The headache has improved a bit.  He has not had any imaging since.  Both [name] and his family think he has not had any change in behavior or coordination or attention speech or thought process since the injury.  Radiates: no Mechanical symptoms: no UE Numbness/tingling: no UE Weakness: no Aggravates: getting into/out of bed, pushing motion Treatments tried: Tramadol; Tylenol  Pertinent review of Systems: No fevers or chills  Relevant historical information: Anticoagulated with Xarelto.  Treated with tramadol and cyclobenzaprine for pain control.  This has helped.   Objective:    Vitals:   07/18/21 1416  BP: 132/78  Pulse: (!) 57  SpO2: 98%   General: Well Developed, well nourished, and in no acute distress.   MSK:  C-spine: Nontender midline.  Normal cervical motion.  Upper extremity strength reflexes and sensation are equal and normal throughout.  Neuro: Alert and oriented.  Slight shuffling gait.  Slight impaired balance.  Intact coordination.  Lab and Radiology Results No results found for this or any previous visit (from the past 72 hour(s)). DG Cervical Spine Complete  Result Date: 07/19/2021 CLINICAL DATA:  History of neck pain. Pain radiates into right shoulder. Prior history of fall. EXAM: CERVICAL SPINE - COMPLETE 4+ VIEW COMPARISON:  No recent. FINDINGS:  Diffuse osteopenia and multilevel degenerative change with multilevel endplate osteophyte formation and facet hypertrophy. Multifocal bilateral mild-to-moderate neural foraminal narrowing noted. No acute bony or joint abnormality identified. No evidence of fracture or dislocation. Ligamentous ossification noted. Pulmonary apices are clear. IMPRESSION: Diffuse osteopenia and multifocal degenerative change. No acute abnormality. Electronically Signed   By: Marcello Moores  Register M.D.   On: 07/19/2021 06:13    I, Andrew [name], personally (independently) visualized and performed the interpretation of the images attached in this note.   Impression and Recommendations:    Assessment and Plan: 88 y.o. male with fall with head neck injury occurring about 3 weeks ago in the setting of anticoagulation.  His fall is concerning enough that if he were to present to the emergency room at the time of injury with his history he would have had a CT scan of his head and probably a cervical spine.  The fact that 3 weeks later he has a largely normal neurologic exam and is is walking and talking and doing pretty well is very reassuring. However I think he benefit from a CT scan of his head to be more confident that were not missing a subtle bleed.  Plan for noncontrast CT scan in the outpatient setting.  Additionally for his cervical spine pain will refer to physical therapy.  He lives at [facility] physical therapy is available there. He can continue taking the cyclobenzaprine and tramadol however he should use low doses  of these and use them with caution.  Recheck with me in 1 month.  PDMP not reviewed this encounter. Orders Placed This Encounter  Procedures   DG Cervical Spine Complete    Standing Status:   Future    Number of Occurrences:   1    Standing Expiration Date:   07/18/2022    Order Specific Question:   Reason for Exam (SYMPTOM  OR DIAGNOSIS REQUIRED)    Answer:   neck pain    Order Specific  Question:   Preferred imaging location?    Answer:   [name]   [place] LIMITED JOINT SPACE STRUCTURES UP RIGHT(NO LINKED CHARGES)    Standing Status:   Future    Number of Occurrences:   1    Standing Expiration Date:   01/15/2022    Order Specific Question:   Reason for Exam (SYMPTOM  OR DIAGNOSIS REQUIRED)    Answer:   right shoulder pain    Order Specific Question:   Preferred imaging location?    Answer:   [place]   CT HEAD WO CONTRAST (5MM)    Standing Status:   Future    Standing Expiration Date:   07/18/2022    Order Specific Question:   Preferred imaging location?    Answer:   [place]   Ambulatory referral to Physical Therapy    Referral Priority:   Routine    Referral Type:   Physical Medicine    Referral Reason:   Specialty Services Required    Requested Specialty:   Physical Therapy    Number of Visits Requested:   1   No orders of the defined types were placed in this encounter.   Discussed warning signs or symptoms. Please see discharge instructions. Patient expresses understanding.   The above documentation has been reviewed and is accurate and complete Andrew [name], M.D.

## 2021-07-18 NOTE — Patient Instructions (Addendum)
Thank you for coming in today.   Please get an Xray today before you leave   Try to wean off of the the Tramadol  I've referred you to Physical Therapy.  Let us know if you don't hear from them in one week.   I've ordered a head CT. You should hear about scheduling within 1 week.  Recheck back in 1 month

## 2021-07-19 NOTE — Progress Notes (Signed)
Cervical spine x-ray shows reduced bone mineral density and several levels of arthritis but no fractures. Have you ever had a bone mineral density test?

## 2021-07-23 ENCOUNTER — Ambulatory Visit (HOSPITAL_BASED_OUTPATIENT_CLINIC_OR_DEPARTMENT_OTHER)
Admission: RE | Admit: 2021-07-23 | Discharge: 2021-07-23 | Disposition: A | Discharge status: Home / Self Care | Payer: Medicare Other | Source: Ambulatory Visit | Attending: Family Medicine | Admitting: Family Medicine

## 2021-07-23 ENCOUNTER — Other Ambulatory Visit: Payer: Self-pay

## 2021-07-23 DIAGNOSIS — S0990XA Unspecified injury of head, initial encounter: Secondary | ICD-10-CM | POA: Diagnosis not present

## 2021-07-25 NOTE — Progress Notes (Signed)
Head CT scan thankfully does not show any brain bleeding or skull fracture

## 2021-08-15 ENCOUNTER — Ambulatory Visit (INDEPENDENT_AMBULATORY_CARE_PROVIDER_SITE_OTHER): Payer: Medicare Other | Admitting: Family Medicine

## 2021-08-15 ENCOUNTER — Other Ambulatory Visit: Payer: Self-pay

## 2021-08-15 VITALS — BP 138/82 | HR 85 | Ht 70.0 in | Wt 191.0 lb

## 2021-08-15 DIAGNOSIS — M810 Age-related osteoporosis without current pathological fracture: Secondary | ICD-10-CM | POA: Diagnosis not present

## 2021-08-15 DIAGNOSIS — M542 Cervicalgia: Secondary | ICD-10-CM

## 2021-08-15 DIAGNOSIS — M8580 Other specified disorders of bone density and structure, unspecified site: Secondary | ICD-10-CM

## 2021-08-15 DIAGNOSIS — M722 Plantar fascial fibromatosis: Secondary | ICD-10-CM

## 2021-08-15 NOTE — Progress Notes (Signed)
? ?I, Peterson Lombard, LAT, ATC acting as a scribe for Lynne Leader, MD. ? ?Andrew [name] is a 88 y.o. male who presents to [place] at [facility] today for f/u R shoulder and neck pain after suffering a fall when he tripped and fell over backwards. 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 Pt was last seen by Dr. Georgina Snell on 07/18/21 and a head CT was ordered and he was referred to [facility] for PT. He has not actually started physical therapy yet.  Pt was also advised to continue taking the cyclobenzaprine and tramadol prn. Today, pt reports R shoulder is improving. Pt c/o continued neck pain w/ mechanical symptoms and limited AROM.  Pt has never had a bone mineral density test in the past. ? ?Additionally he notes pain at the plantar foot suspecting planter fasciitis.  He thinks this has improved a bit over the last few weeks but is looking for some home exercises to do. ? ?Dx imaging: 07/23/21 Head CT ? 07/18/21 C-spine XR ? ?Pertinent review of systems: No fevers or chills ? ?Relevant historical information: Hypertension.  Suspected osteoporosis. ? ? ?Exam:  ?BP 138/82   Pulse 85   Ht '5\' 10"'$  (1.778 m)   Wt 191 lb (86.6 kg)   SpO2 98%   BMI 27.41 kg/m?  ?General: Well Developed, well nourished, and in no acute distress.  ? ?MSK: C-spine: Normal. ?Nontender midline. ?Decreased cervical motion. ? ?Right foot: Normal. ?Tender palpation plantar mid arch.  Normal foot and ankle motion. ? ? ? ?Lab and Radiology Results ?EXAM: ?CERVICAL SPINE - COMPLETE 4+ VIEW ?  ?COMPARISON:  No recent. ?  ?FINDINGS: ?Diffuse osteopenia and multilevel degenerative change with ?multilevel endplate osteophyte formation and facet hypertrophy. ?Multifocal bilateral mild-to-moderate neural foraminal narrowing ?noted. No acute bony or joint abnormality identified. No evidence of ?fracture or dislocation. Ligamentous ossification noted. Pulmonary ?apices are clear. ?   ?IMPRESSION: ?Diffuse osteopenia and multifocal degenerative change. No acute ?abnormality. ?  ?  ?Electronically Signed ?  By: Marcello Moores  Register M.D. ?  On: 07/19/2021 06:13 ?I, Lynne Leader, personally (independently) visualized and performed the interpretation of the images attached in this note. ? ? ? ? ? ?Assessment and Plan: ?88 y.o. male with  ?Neck pain: Plan to refer back to [facility] for physical therapy.  Recheck back in 6 weeks. ? ?Foot pain: Planter fasciitis.  Home exercise program planning for today.  Some teaching performed by myself today prior to discharge.  Patient left prior to formal ATC teaching. ? ?Suspected osteoporosis.  Plan for DEXA scan.  Discussed medication options. ? ? ?PDMP not reviewed this encounter. ?Orders Placed This Encounter  ?Procedures  ? DG BONE DENSITY (DXA)  ?  Standing Status:   Future  ?  Standing Expiration Date:   08/16/2022  ?  Order Specific Question:   Reason for Exam (SYMPTOM  OR DIAGNOSIS REQUIRED)  ?  Answer:   decreased bone density  ?  Order Specific Question:   Preferred imaging location?  ?  Answer:   [facility]  ? Ambulatory referral to Physical Therapy  ?  Referral Priority:   Routine  ?  Referral Type:   Physical Medicine  ?  Referral Reason:   Specialty Services Required  ?  Requested Specialty:   Physical Therapy  ?  Number of Visits Requested:   1  ? ?No orders of the defined types were  placed in this encounter. ? ? ? ?Discussed warning signs or symptoms. Please see discharge instructions. Patient expresses understanding. ?The above documentation has been reviewed and is accurate and complete Lynne Leader, M.D. ? ?

## 2021-08-15 NOTE — Patient Instructions (Addendum)
Thank you for coming in today.  ? ?Work on physical therapy at [facility] to help with you neck. ? ?Please complete the exercises that the athletic trainer went over with you:  View at [email or web address]-exercise-[email or web address] using code: [id number] ? ?Recheck back in 6 weeks ? ? ?

## 2021-08-22 ENCOUNTER — Other Ambulatory Visit: Payer: Self-pay

## 2021-08-23 ENCOUNTER — Inpatient Hospital Stay: Payer: Medicare Other

## 2021-08-23 ENCOUNTER — Other Ambulatory Visit: Payer: Self-pay

## 2021-08-23 ENCOUNTER — Inpatient Hospital Stay: Payer: Medicare Other | Attending: Hematology

## 2021-08-23 LAB — CBC WITH DIFFERENTIAL (CANCER CENTER ONLY)
Abs Immature Granulocytes: 0.02 10*3/uL (ref 0.00–0.07)
Basophils Absolute: 0 10*3/uL (ref 0.0–0.1)
Basophils Relative: 1 %
Eosinophils Absolute: 0.2 10*3/uL (ref 0.0–0.5)
Eosinophils Relative: 3 %
HCT: 40.5 % (ref 39.0–52.0)
Hemoglobin: 14.4 g/dL (ref 13.0–17.0)
Immature Granulocytes: 0 %
Lymphocytes Relative: 17 %
Lymphs Abs: 1.3 10*3/uL (ref 0.7–4.0)
MCH: 31.6 pg (ref 26.0–34.0)
MCHC: 35.6 g/dL (ref 30.0–36.0)
MCV: 88.8 fL (ref 80.0–100.0)
Monocytes Absolute: 0.6 10*3/uL (ref 0.1–1.0)
Monocytes Relative: 8 %
Neutro Abs: 5.2 10*3/uL (ref 1.7–7.7)
Neutrophils Relative %: 71 %
Platelet Count: 162 10*3/uL (ref 150–400)
RBC: 4.56 MIL/uL (ref 4.22–5.81)
RDW: 11.8 % (ref 11.5–15.5)
WBC Count: 7.4 10*3/uL (ref 4.0–10.5)
nRBC: 0 % (ref 0.0–0.2)

## 2021-08-23 LAB — CMP (CANCER CENTER ONLY)
ALT: 24 U/L (ref 0–44)
AST: 29 U/L (ref 15–41)
Albumin: 3.8 g/dL (ref 3.5–5.0)
Alkaline Phosphatase: 88 U/L (ref 38–126)
Anion gap: 5 (ref 5–15)
BUN: 14 mg/dL (ref 8–23)
CO2: 32 mmol/L (ref 22–32)
Calcium: 9.7 mg/dL (ref 8.9–10.3)
Chloride: 100 mmol/L (ref 98–111)
Creatinine: 0.94 mg/dL (ref 0.61–1.24)
GFR, Estimated: >60 mL/min (ref >60)
Glucose, Bld: 125 mg/dL — ABNORMAL HIGH (ref 70–99)
Potassium: 3.6 mmol/L (ref 3.5–5.1)
Sodium: 137 mmol/L (ref 135–145)
Total Bilirubin: 1 mg/dL (ref 0.3–1.2)
Total Protein: 7.4 g/dL (ref 6.5–8.1)

## 2021-08-23 LAB — IRON AND IRON BINDING CAPACITY (CC-WL,HP ONLY)
Iron: 142 ug/dL (ref 45–182)
Saturation Ratios: 54 % — ABNORMAL HIGH (ref 17.9–39.5)
TIBC: 262 ug/dL (ref 250–450)
UIBC: 120 ug/dL (ref 117–376)

## 2021-08-23 NOTE — Progress Notes (Signed)
Per Dr. Irene Limbo, ok for phlebotomy today with current labs. [name] presents today for phlebotomy per MD orders. ?Phlebotomy procedure started at [facility] [place] and ended at  1631 ?516 grams removed. ?Patient observed for 30 minutes after procedure without any incident. ?Patient tolerated procedure well. ?IV needle removed intact. ?Food and fluids offered, fluids taken. ? ?

## 2021-08-23 NOTE — Patient Instructions (Signed)
Therapeutic Phlebotomy °Therapeutic phlebotomy is the planned removal of blood from a person's body for the purpose of treating a medical condition. The procedure is lot like donating blood. Usually, about a pint (470 mL, or 0.47 L) of blood is removed. The average adult has 9-12 pints (4.3-5.7 L) of blood in his or her body. °Therapeutic phlebotomy may be used to treat the following medical conditions: °Hemochromatosis. This is a condition in which the blood contains too much iron. °Polycythemia vera. This is a condition in which the blood contains too many red blood cells. °Porphyria cutanea tarda. This is a disease in which an important part of hemoglobin is not made properly. It results in the buildup of abnormal amounts of porphyrins in the body. °Sickle cell disease. This is a condition in which the red blood cells form an abnormal crescent shape rather than a round shape. °Tell a health care provider about: °Any allergies you have. °All medicines you are taking, including vitamins, herbs, eye drops, creams, and over-the-counter medicines. °Any bleeding problems you have. °Any surgeries you have had. °Any medical conditions you have. °Whether you are pregnant or may be pregnant. °What are the risks? °Generally, this is a safe procedure. However, problems may occur, including: °Nausea or light-headedness. °Low blood pressure (hypotension). °Soreness, bleeding, swelling, or bruising at the needle insertion site. °Infection. °What happens before the procedure? °Ask your health care provider about: °Changing or stopping your regular medicines. This is especially important if you are taking diabetes medicines or blood thinners. °Taking medicines such as aspirin and ibuprofen. These medicines can thin your blood. Do not take these medicines unless your health care provider tells you to take them. °Taking over-the-counter medicines, vitamins, herbs, and supplements. °Wear clothing with sleeves that can be raised  above the elbow. °You may have a blood sample taken. °Your blood pressure, pulse rate, and breathing rate will be measured. °What happens during the procedure? ° °You may be given a medicine to numb the area (local anesthetic). °A tourniquet will be placed on your arm. °A needle will be put into one of your veins. °Tubing and a collection bag will be attached to the needle. °Blood will flow through the needle and tubing into the collection bag. °The collection bag will be placed lower than your arm so gravity can help the blood flow into the bag. °You may be asked to open and close your hand slowly and continually during the entire collection. °After the specified amount of blood has been removed from your body, the collection bag and tubing will be clamped. °The needle will be removed from your vein. °Pressure will be held on the needle site to stop the bleeding. °A bandage (dressing) will be placed over the needle insertion site. °The procedure may vary among health care providers and hospitals. °What happens after the procedure? °Your blood pressure, pulse rate, and breathing rate will be measured after the procedure. °You will be encouraged to drink fluids. °You will be encouraged to eat a snack to prevent a low blood sugar level. °Your recovery will be assessed and monitored. °Return to your normal activities as told by your health care provider. °Summary °Therapeutic phlebotomy is the planned removal of blood from a person's body for the purpose of treating a medical condition. °Therapeutic phlebotomy may be used to treat hemochromatosis, polycythemia vera, porphyria cutanea tarda, or sickle cell disease. °In the procedure, a needle is inserted and about a pint (470 mL, or 0.47 L) of blood is   removed. The average adult has 9-12 pints (4.3-5.7 L) of blood in the body. °This is generally a safe procedure, but it can sometimes cause problems such as nausea, light-headedness, or low blood pressure  (hypotension). °This information is not intended to replace advice given to you by your health care provider. Make sure you discuss any questions you have with your health care provider. °Document Revised: 11/17/2020 Document Reviewed: 11/17/2020 °Elsevier Patient Education © 2022 [employer] Inc. ° °

## 2021-08-24 LAB — FERRITIN: Ferritin: 118 ng/mL (ref 24–336)

## 2021-09-05 ENCOUNTER — Ambulatory Visit (INDEPENDENT_AMBULATORY_CARE_PROVIDER_SITE_OTHER): Payer: Medicare Other | Admitting: Family Medicine

## 2021-09-05 ENCOUNTER — Ambulatory Visit: Payer: Self-pay

## 2021-09-05 ENCOUNTER — Encounter: Payer: Self-pay | Admitting: Family Medicine

## 2021-09-05 ENCOUNTER — Ambulatory Visit (INDEPENDENT_AMBULATORY_CARE_PROVIDER_SITE_OTHER): Payer: Medicare Other

## 2021-09-05 VITALS — BP 128/80 | HR 86 | Ht 70.0 in | Wt 190.4 lb

## 2021-09-05 DIAGNOSIS — M25512 Pain in left shoulder: Secondary | ICD-10-CM

## 2021-09-05 DIAGNOSIS — S51012A Laceration without foreign body of left elbow, initial encounter: Secondary | ICD-10-CM

## 2021-09-05 NOTE — Patient Instructions (Addendum)
Good to see you. ? ?I've referred you to Physical Therapy at [facility].  If they don't contact you by the end of the week regarding scheduling, please call us and let us know. ? ?Follow-up: one month ?

## 2021-09-05 NOTE — Progress Notes (Signed)
? ?I, Wendy Poet, LAT, ATC, am serving as scribe for Dr. Lynne Leader. ? ?[name] is a 88 y.o. male who presents to [place] at [facility] today for L shoulder/arm pain after suffering a fall.  He was last seen by Dr. Georgina Snell on 08/15/21 for f/u of R shoulder and neck pain after suffering a fall at work in late Jan 2023 and was referred to PT at [facility].  He was also referred for a DEXA scan and was shown a HEP for plantar fasciitis.  Today, pt reports L shoulder/arm pain after suffering a fall on last Tuesday, August 30, 2021.  He was helping his daughter w/ a garden bed when he tripped and fell forward and hit his L arm on the fence.  He locates his pain to his L shoulder and L upper arm. ?.. ?He also suffered an abrasion to his left elbow.  It bleeds every time he changes his dressing so he decided to stop taking his blood thinner. ? ?Neck pain: no ?L shoulder mechanical symptoms: no ?Radiating pain: yes into his L upper arm ?Aggravating factors: L shoulder F and Abd past 15-30 deg ?Treatments tried: Tylenol ? ?He also managed to get an abrasion on his L elbow that he would like looked at. ? ?Diagnostic testing: C-spine XR- 07/18/21; ? ?Pertinent review of systems: No fevers or chills ? ?Relevant historical information: Hypertension and atrial fibrillation. ? ? ?Exam:  ?BP 128/80 (BP Location: Right Arm, Patient Position: Sitting, Cuff Size: Normal)   Pulse 86   Ht '5\' 10"'$  (1.778 m)   Wt 190 lb 6.4 oz (86.4 kg)   SpO2 97%   BMI 27.32 kg/m?  ?General: Well Developed, well nourished, and in no acute distress.  ? ?MSK: Left shoulder: Normal-appearing decreased active range of motion to abduction and forward flexion to about 30 degrees.  External rotation range of motion is intact functional internal rotation range of motion limited lumbar spine. ?Strength abduction very limited. ?External rotation and internal strength intact. ? ?Left elbow: Small skin tear located overlying  the olecranon ?Normal elbow motion and strength. ? ? ? ?Lab and Radiology Results ? ?X-ray images left shoulder obtained today personally independently interpreted.   ?No acute fractures.  Mild degenerative changes present. ?Await formal radiology review ? ? ? ?Assessment and Plan: ?88 y.o. male with left shoulder weakness after fall.  This is highly concerning for full-thickness rotator cuff tear of the supraspinatus tendon.  Given his age and other health concerns he is not a good surgical candidate for rotator cuff repair.  He agrees with this assessment.  He would like to avoid surgery if possible.  He would like to proceed with a trial of conservative management and physical therapy.  After discussion he is aware that this may not be surgically repairable in the future if he does not choose to attempt surgery now. ?Plan for physical therapy.  He lives at an assisted living facility [facility].  PT can be arranged there. ? ?As for the left elbow abrasion I applied a Tegaderm and recommended leaving it on for a few more days.  I also recommended that he restart his blood thinner. ? ?Recheck in 1 month. ? ?PDMP not reviewed this encounter. ?Orders Placed This Encounter  ?Procedures  ? DG Shoulder Left  ?  Standing Status:   Future  ?  Number of Occurrences:   1  ?  Standing Expiration Date:  10/05/2021  ?  Order Specific Question:   Reason for Exam (SYMPTOM  OR DIAGNOSIS REQUIRED)  ?  Answer:   L shoulder pain  ?  Order Specific Question:   Preferred imaging location?  ?  Answer:   [facility]  ? Ambulatory referral to Physical Therapy  ?  Referral Priority:   Routine  ?  Referral Type:   Physical Medicine  ?  Referral Reason:   Specialty Services Required  ?  Requested Specialty:   Physical Therapy  ?  Number of Visits Requested:   1  ? ?No orders of the defined types were placed in this encounter. ? ? ? ?Discussed warning signs or symptoms. Please see discharge instructions. Patient expresses  understanding. ? ? ?The above documentation has been reviewed and is accurate and complete Lynne Leader, M.D. ? ? ?

## 2021-09-07 NOTE — Progress Notes (Signed)
Left shoulder x-ray shows some mild arthritis changes.  No fractures are visible.

## 2021-09-13 DIAGNOSIS — M25512 Pain in left shoulder: Secondary | ICD-10-CM | POA: Diagnosis not present

## 2021-09-14 ENCOUNTER — Other Ambulatory Visit: Payer: Self-pay | Admitting: Cardiovascular Disease

## 2021-09-14 NOTE — Telephone Encounter (Signed)
Prescription refill request for Xarelto received.  ?Indication:Afib ?Last office visit:Needs Appointment ?Weight:86.4 kg ?Age:88 ?Scr:0.9 ?CrCl:69.33 ml/min ? ?Prescription refilled ? ?

## 2021-09-16 DIAGNOSIS — M25512 Pain in left shoulder: Secondary | ICD-10-CM | POA: Diagnosis not present

## 2021-09-19 DIAGNOSIS — M25512 Pain in left shoulder: Secondary | ICD-10-CM | POA: Diagnosis not present

## 2021-09-23 DIAGNOSIS — M25512 Pain in left shoulder: Secondary | ICD-10-CM | POA: Diagnosis not present

## 2021-09-26 DIAGNOSIS — M25512 Pain in left shoulder: Secondary | ICD-10-CM | POA: Diagnosis not present

## 2021-09-30 DIAGNOSIS — M25512 Pain in left shoulder: Secondary | ICD-10-CM | POA: Diagnosis not present

## 2021-10-03 ENCOUNTER — Ambulatory Visit (INDEPENDENT_AMBULATORY_CARE_PROVIDER_SITE_OTHER): Payer: Medicare Other | Admitting: Family Medicine

## 2021-10-03 VITALS — BP 128/84 | HR 80 | Ht 70.0 in | Wt 189.0 lb

## 2021-10-03 DIAGNOSIS — M25512 Pain in left shoulder: Secondary | ICD-10-CM

## 2021-10-03 NOTE — Patient Instructions (Addendum)
Thank you for coming in today.  ? ?Glad you are improving! Keep up the good work. ? ?Recheck back as needed ?

## 2021-10-03 NOTE — Progress Notes (Signed)
? ?  I, Peterson Lombard, LAT, ATC acting as a scribe for Lynne Leader, MD. ? ?[name] is a 88 y.o. male who presents to [place] at [facility] today for f/u L shoulder/arm pain and that he injured on 08/30/21 when he tripped and fell while helping his daughter w/ a garden bed. Pt was last seen by Dr. Georgina Snell on 09/05/21 and was referred to [facility] for PT. L elbow abrasion was covered w/ Tegaderm and pt was advised to leave it on for a few days and to restart his blood thinner. Today, pt reports L shoulder arm is feeling better. Pt notes he really likes his PT and feels he's making progress. Pt reports he is able to ADL that he wasn't able to do before.  ? ?Dx imaging: 09/05/21 L shoulder XR ? 07/18/21 C-spine XR ? ?Pertinent review of systems: No fevers or chills ? ?Relevant historical information: Paroxysmal atrial fibrillation ? ? ?Exam:  ?BP 128/84   Pulse 80   Ht '5\' 10"'$  (1.778 m)   Wt 189 lb (85.7 kg)   BMI 27.12 kg/m?  ?General: Well Developed, well nourished, and in no acute distress.  ? ?MSK: Left shoulder normal range of motion reduced strength abduction. ? ? ? ?Lab and Radiology Results ?EXAM: ?LEFT SHOULDER - 2+ VIEW ?  ?COMPARISON:  None. ?  ?FINDINGS: ?Mild glenohumeral and acromioclavicular degenerative spur formation. ?Minimal greater tuberosity hyperostosis. Diffuse osteopenia. No ?fracture or dislocation seen. ?  ?IMPRESSION: ?1. No fracture or dislocation. ?2. Mild degenerative changes. ?  ?  ?Electronically Signed ?  By: Claudie Revering M.D. ?  On: 09/06/2021 11:11 ?I, Lynne Leader, personally (independently) visualized and performed the interpretation of the images attached in this note. ? ? ? ? ?Assessment and Plan: ?88 y.o. male with left shoulder pain and weakness after a fall thought to be due to full-thickness rotator cuff tear.  He has done quite well with conservative management with physical therapy and has regained his range of motion but not his abduction  strength fully.  This is as expected.  He is happy with outcomes.  He has been discharged from PT and will continue home exercise program.  Recheck back as needed. ? ? ? ? ?Discussed warning signs or symptoms. Please see discharge instructions. Patient expresses understanding. ? ? ?The above documentation has been reviewed and is accurate and complete Lynne Leader, M.D. ? ? ?

## 2021-10-10 DIAGNOSIS — M25512 Pain in left shoulder: Secondary | ICD-10-CM | POA: Diagnosis not present

## 2021-10-16 ENCOUNTER — Other Ambulatory Visit: Payer: Self-pay | Admitting: Cardiovascular Disease

## 2021-10-17 NOTE — Telephone Encounter (Signed)
Prescription refill request for Xarelto received.  ?Indication:Afib ?Last office visit:NEEDS APPOINTMENT ?Weight:85.7 kg ?Age:88 ?Scr:0.9 ?CrCl:67.45 ml/min ? ?Prescription refilled ? ?

## 2021-10-28 DIAGNOSIS — H903 Sensorineural hearing loss, bilateral: Secondary | ICD-10-CM | POA: Diagnosis not present

## 2021-11-02 NOTE — Progress Notes (Unsigned)
Office Visit    Patient Name: Andrew [name] Date of Encounter: 11/03/2021  Primary Care Provider:  Deland Pretty, MD Primary Cardiologist:  Shelva Majestic, MD  Chief Complaint    88 year old male with a history of paroxysmal atrial fibrillation, hypertension, OSA, and hereditary hemochromatosis who presents for follow-up related to atrial fibrillation.  Past Medical History    Past Medical History:  Diagnosis Date   Adenomatous colon polyp 10/1993   Diverticulosis    Hemochromatosis    Compound Heterozygous for C282Y & H63D mutations   Hepatitis B antibody positive    Hypertension    OSA treated with BiPAP    PAF (paroxysmal atrial fibrillation) ([place])    Past Surgical History:  Procedure Laterality Date   CARDIOVERSION  08/18/2011   Procedure: CARDIOVERSION;  Surgeon: Troy Sine, MD;  Location: [place];  Service: Cardiovascular;  Laterality: N/A;   CARDIOVERSION  11/22/2011   Procedure: CARDIOVERSION;  Surgeon: Troy Sine, MD;  Location: [facility];  Service: Cardiovascular;  Laterality: N/A;   COLONOSCOPY     KNEE ARTHROSCOPY     LAPAROSCOPIC CHOLECYSTECTOMY  04/2009   NM MYOCAR PERF WALL MOTION  05/08/2008   Normal   TONSILLECTOMY     US ECHOCARDIOGRAPHY  07/27/2011   Mild concentric LVH, LA & RA is moderately dilated, mild to mod MR.    Allergies  No Known Allergies  History of Present Illness    88 year old male with the above past medical history including paroxysmal atrial fibrillation, hypertension, OSA, and hereditary hemochromatosis.   He has a history of normal stress test in 2009.  He was diagnosed with atrial fibrillation in 2013.  Echocardiogram at the time showed EF greater than 55%, mild MR, mild TR. He was initiated on beta-blocker therapy and Xarelto at the time.  He developed recurrent atrial fibrillation after his initial cardioversion.  Additionally, he was found to have obstructive sleep apnea and was started on CPAP/BiPAP therapy prior to a  second cardioversion.  Second cardioversion in June 2013 was successful and with treatment of sleep apnea, he has maintained sinus rhythm. He last saw Dr. Claiborne Billings virtually on 12/19/2018 and was stable overall from a cardiac standpoint. He has not been seen in follow-up since.  He presents today for follow-up. Since his last visit he has been stable from a cardiac standpoint.  He reports occasional tiredness. He notes his HR has been more elevated recently (70 bpm, previously in the 50s). BP is borderline in office today. He was recently started on chlorthalidone for BP control. He denies palpitations, dyspnea, edema, PND, orthopnea, denies symptoms concerning for angina.  EKG today shows atrial fibrillation, 75 bpm. Patient states that he is unaware of his atrial fibrillation, and is unaware how long he has been out of rhythm. Overall, he reports feeling well and denies any new concerns today.  Home Medications    Current Outpatient Medications  Medication Sig Dispense Refill   amLODipine (NORVASC) 5 MG tablet Take 5 mg by mouth daily.     chlorthalidone (HYGROTON) 25 MG tablet Take 12.5 mg by mouth daily.     finasteride (PROSCAR) 5 MG tablet Take 5 mg by mouth daily.     lisinopril (PRINIVIL,ZESTRIL) 40 MG tablet Take 40 mg by mouth daily.     metoprolol succinate (TOPROL-XL) 25 MG 24 hr tablet Alternate taking '25mg'$  and 12.'5mg'$  daily 135 tablet 3   alfuzosin (UROXATRAL) 10 MG 24 hr tablet Take 10 mg by mouth  daily with breakfast. (Patient not taking: Reported on 11/03/2021)     propafenone (RYTHMOL) 150 MG tablet TAKE 1 TABLET (150 MG TOTAL) BY MOUTH EVERY 8 (EIGHT) HOURS. (Patient not taking: Reported on 11/03/2021) 270 tablet 3   Rivaroxaban (XARELTO) 15 MG TABS tablet Take 1 tablet (15 mg total) by mouth daily with supper. 90 tablet 3   No current facility-administered medications for this visit.     Review of Systems    He denies chest pain, palpitations, dyspnea, pnd, orthopnea, n, v,  dizziness, syncope, edema, weight gain, or early satiety. All other systems reviewed and are otherwise negative except as noted above.   Physical Exam    VS:  BP 104/62   Pulse 75   Ht '5\' 11"'$  (1.803 m)   Wt 186 lb 9.6 oz (84.6 kg)   SpO2 98%   BMI 26.03 kg/m   GEN: Well nourished, well developed, in no acute distress. HEENT: normal. Neck: Supple, no JVD, carotid bruits, or masses. Cardiac: IRIR, no murmurs, rubs, or gallops. No clubbing, cyanosis, edema.  Radials/DP/PT 2+ and equal bilaterally.  Respiratory:  Respirations regular and unlabored, clear to auscultation bilaterally. GI: Soft, nontender, nondistended, BS + x 4. MS: no deformity or atrophy. Skin: warm and dry, no rash. Neuro:  Strength and sensation are intact. Psych: Normal affect.  Accessory Clinical Findings    ECG personally reviewed by me today -fibrillation, 75 bpm, LAD.  Lab Results  Component Value Date   WBC 7.4 08/23/2021   HGB 14.4 08/23/2021   HCT 40.5 08/23/2021   MCV 88.8 08/23/2021   PLT 162 08/23/2021   Lab Results  Component Value Date   CREATININE 0.94 08/23/2021   BUN 14 08/23/2021   NA 137 08/23/2021   K 3.6 08/23/2021   CL 100 08/23/2021   CO2 32 08/23/2021   Lab Results  Component Value Date   ALT 24 08/23/2021   AST 29 08/23/2021   ALKPHOS 88 08/23/2021   BILITOT 1.0 08/23/2021   No results found for: CHOL, HDL, LDLCALC, LDLDIRECT, TRIG, CHOLHDL  No results found for: HGBA1C  Assessment & Plan    1. Persistent atrial fibrillation: He has not been seen in the office since 2019.  At that time he was in sinus rhythm.  EKG today shows atrial fibrillation, 75 bpm.  He does not know how long he has been in atrial fibrillation as he is completely asymptomatic.  He has been adherent to his Xarelto, denies bleeding.  He states he is not interested in repeat cardioversion, additional antiarrhythmic medication, or ablation at this time.  He would prefer a rate control strategy.  He notes  his HR has been more elevated recently in the 47s, whereas before ran in the 101s.  He denies palpitations, dizziness, dyspnea, edema, PND, orthopnea.  Continue metoprolol, Xarelto.  2. Hypertension: BP borderline low in office today.  Reports feeling tired at times. Will decrease chlorthalidone to 12.5 mg daily.  I advised him to continue to monitor BP at home and report BP consistently > 130/80, SBP consistently < 100. Continue amlodipine, lisinopril, metoprolol.  3. OSA: Adherent to CPAP.  Denies any new concerns.  4. Disposition: Follow-up in 6 months with Dr. Claiborne Billings.  Lenna Sciara, NP 11/03/2021, 2:07 PM

## 2021-11-03 ENCOUNTER — Ambulatory Visit (INDEPENDENT_AMBULATORY_CARE_PROVIDER_SITE_OTHER): Payer: Medicare Other | Admitting: Nurse Practitioner

## 2021-11-03 ENCOUNTER — Encounter: Payer: Self-pay | Admitting: Nurse Practitioner

## 2021-11-03 VITALS — BP 104/62 | HR 75 | Ht 71.0 in | Wt 186.6 lb

## 2021-11-03 DIAGNOSIS — I10 Essential (primary) hypertension: Secondary | ICD-10-CM

## 2021-11-03 DIAGNOSIS — G4733 Obstructive sleep apnea (adult) (pediatric): Secondary | ICD-10-CM

## 2021-11-03 DIAGNOSIS — I480 Paroxysmal atrial fibrillation: Secondary | ICD-10-CM

## 2021-11-03 MED ORDER — RIVAROXABAN 15 MG PO TABS: 15.0000 mg | ORAL_TABLET | Freq: Every day | ORAL | 3 refills | Status: DC

## 2021-11-03 NOTE — Patient Instructions (Addendum)
Medication Instructions:  Decrease Chlorthalidone 12.5 mg daily   *If you need a refill on your cardiac medications before your next appointment, please call your pharmacy*   Lab Work: NONE ordered at this time of appointment   If you have labs (blood work) drawn today and your tests are completely normal, you will receive your results only by: [place] (if you have MyChart) OR A paper copy in the mail If you have any lab test that is abnormal or we need to change your treatment, we will call you to review the results.   Testing/Procedures: NONE ordered at this time of appointment     Follow-Up: At [facility], you and your health needs are our priority.  As part of our continuing mission to provide you with exceptional heart care, we have created designated Provider Care Teams.  These Care Teams include your primary Cardiologist (physician) and Advanced Practice Providers (APPs -  Physician Assistants and Nurse Practitioners) who all work together to provide you with the care you need, when you need it.  We recommend signing up for the patient portal called "MyChart".  Sign up information is provided on this After Visit Summary.  MyChart is used to connect with patients for Virtual Visits (Telemedicine).  Patients are able to view lab/test results, encounter notes, upcoming appointments, etc.  Non-urgent messages can be sent to your provider as well.   To learn more about what you can do with MyChart, go to [email or web address].    Your next appointment:   6 month(s)  The format for your next appointment:   In Person  Provider:   Shelva Majestic, MD     Other Instructions Monitor Blood Pressure. Report blood pressure consistently greater than 174/94 or systolic blood pressure (top number) less than 100  Important Information About Sugar

## 2021-11-14 DIAGNOSIS — R7303 Prediabetes: Secondary | ICD-10-CM | POA: Diagnosis not present

## 2021-11-14 DIAGNOSIS — E785 Hyperlipidemia, unspecified: Secondary | ICD-10-CM | POA: Diagnosis not present

## 2021-11-14 DIAGNOSIS — I10 Essential (primary) hypertension: Secondary | ICD-10-CM | POA: Diagnosis not present

## 2021-11-16 DIAGNOSIS — Z23 Encounter for immunization: Secondary | ICD-10-CM | POA: Diagnosis not present

## 2021-11-22 ENCOUNTER — Other Ambulatory Visit: Payer: Self-pay | Admitting: *Deleted

## 2021-11-23 ENCOUNTER — Inpatient Hospital Stay: Payer: Medicare Other | Attending: Hematology

## 2021-11-23 ENCOUNTER — Inpatient Hospital Stay (HOSPITAL_BASED_OUTPATIENT_CLINIC_OR_DEPARTMENT_OTHER): Payer: Medicare Other | Admitting: Hematology

## 2021-11-23 ENCOUNTER — Inpatient Hospital Stay: Payer: Medicare Other

## 2021-11-23 ENCOUNTER — Other Ambulatory Visit: Payer: Self-pay

## 2021-11-23 DIAGNOSIS — Z79899 Other long term (current) drug therapy: Secondary | ICD-10-CM | POA: Insufficient documentation

## 2021-11-23 LAB — CBC WITH DIFFERENTIAL (CANCER CENTER ONLY)
Abs Immature Granulocytes: 0.02 10*3/uL (ref 0.00–0.07)
Basophils Absolute: 0 10*3/uL (ref 0.0–0.1)
Basophils Relative: 1 %
Eosinophils Absolute: 0.1 10*3/uL (ref 0.0–0.5)
Eosinophils Relative: 2 %
HCT: 42 % (ref 39.0–52.0)
Hemoglobin: 15.2 g/dL (ref 13.0–17.0)
Immature Granulocytes: 0 %
Lymphocytes Relative: 14 %
Lymphs Abs: 1 10*3/uL (ref 0.7–4.0)
MCH: 32.1 pg (ref 26.0–34.0)
MCHC: 36.2 g/dL — ABNORMAL HIGH (ref 30.0–36.0)
MCV: 88.8 fL (ref 80.0–100.0)
Monocytes Absolute: 0.6 10*3/uL (ref 0.1–1.0)
Monocytes Relative: 8 %
Neutro Abs: 5.5 10*3/uL (ref 1.7–7.7)
Neutrophils Relative %: 75 %
Platelet Count: 165 10*3/uL (ref 150–400)
RBC: 4.73 MIL/uL (ref 4.22–5.81)
RDW: 11.9 % (ref 11.5–15.5)
WBC Count: 7.3 10*3/uL (ref 4.0–10.5)
nRBC: 0 % (ref 0.0–0.2)

## 2021-11-23 LAB — CMP (CANCER CENTER ONLY)
ALT: 26 U/L (ref 0–44)
AST: 29 U/L (ref 15–41)
Albumin: 4 g/dL (ref 3.5–5.0)
Alkaline Phosphatase: 77 U/L (ref 38–126)
Anion gap: 6 (ref 5–15)
BUN: 15 mg/dL (ref 8–23)
CO2: 31 mmol/L (ref 22–32)
Calcium: 9.9 mg/dL (ref 8.9–10.3)
Chloride: 97 mmol/L — ABNORMAL LOW (ref 98–111)
Creatinine: 1.04 mg/dL (ref 0.61–1.24)
GFR, Estimated: >60 mL/min (ref >60)
Glucose, Bld: 144 mg/dL — ABNORMAL HIGH (ref 70–99)
Potassium: 3.4 mmol/L — ABNORMAL LOW (ref 3.5–5.1)
Sodium: 134 mmol/L — ABNORMAL LOW (ref 135–145)
Total Bilirubin: 1.2 mg/dL (ref 0.3–1.2)
Total Protein: 7.6 g/dL (ref 6.5–8.1)

## 2021-11-23 LAB — IRON AND IRON BINDING CAPACITY (CC-WL,HP ONLY)
Iron: 151 ug/dL (ref 45–182)
Saturation Ratios: 55 % — ABNORMAL HIGH (ref 17.9–39.5)
TIBC: 273 ug/dL (ref 250–450)
UIBC: 122 ug/dL (ref 117–376)

## 2021-11-23 LAB — FERRITIN: Ferritin: 73 ng/mL (ref 24–336)

## 2021-11-23 NOTE — Progress Notes (Signed)
Andrew [name] presents today for phlebotomy per MD orders. Phlebotomy procedure started at 1006 and ended at 1023. 499 grams removed via 16 G needle to [place]. IV needle removed intact. Patient provided with food and drink following procedure.  Patient observed for 30 minutes after procedure without any incident. Vital signs retaken and remained stable upon discharge. Patient tolerated procedure well.

## 2021-11-23 NOTE — Patient Instructions (Signed)

## 2021-11-25 ENCOUNTER — Telehealth: Payer: Self-pay | Admitting: Hematology

## 2021-11-30 ENCOUNTER — Encounter: Payer: Self-pay | Admitting: Hematology

## 2021-11-30 NOTE — Progress Notes (Signed)
HEMATOLOGY/ONCOLOGY CLINIC NOTE  Date of Service: .11/23/2021   Patient Care Team: Deland Pretty, MD as PCP - General (Internal Medicine) Troy Sine, MD as PCP - Cardiology (Cardiology)  CHIEF COMPLAINTS/PURPOSE OF CONSULTATION:  Transfer of care for Hereditary hemochromatosis for continued management.  HISTORY OF PRESENTING ILLNESS:   Andrew [name] is here for continued evaluation and management of his hereditary hemochromatosis. Patient notes no acute new symptoms since his last visit and is tolerating phlebotomies okay. He is feeling well and has no new symptoms such as fevers chills night sweats or unexpected weight loss.  Labs done today were discussed with the patient in detail MEDICAL HISTORY:  Past Medical History:  Diagnosis Date   Adenomatous colon polyp 10/1993   Diverticulosis    Hemochromatosis    Compound Heterozygous for C282Y & H63D mutations   Hepatitis B antibody positive    Hypertension    OSA treated with BiPAP    PAF (paroxysmal atrial fibrillation) ([place])     SURGICAL HISTORY: Past Surgical History:  Procedure Laterality Date   CARDIOVERSION  08/18/2011   Procedure: CARDIOVERSION;  Surgeon: Troy Sine, MD;  Location: [place];  Service: Cardiovascular;  Laterality: N/A;   CARDIOVERSION  11/22/2011   Procedure: CARDIOVERSION;  Surgeon: Troy Sine, MD;  Location: [place];  Service: Cardiovascular;  Laterality: N/A;   COLONOSCOPY     KNEE ARTHROSCOPY     LAPAROSCOPIC CHOLECYSTECTOMY  04/2009   NM MYOCAR PERF WALL MOTION  05/08/2008   Normal   TONSILLECTOMY     US ECHOCARDIOGRAPHY  07/27/2011   Mild concentric LVH, LA & RA is moderately dilated, mild to mod MR.    SOCIAL HISTORY: Social History   Socioeconomic History   Marital status: Married    Spouse name: Not on file   Number of children: Not on file   Years of education: Not on file   Highest education level: Not on file  Occupational History   Occupation: Retired     Fish farm manager: RETIRED  Tobacco Use   Smoking status: Former   Smokeless tobacco: Never  Substance and Sexual Activity   Alcohol use: Yes    Alcohol/week: 0.0 standard drinks of alcohol    Comment: 2 glasses if wine daily   Drug use: No   Sexual activity: Not on file  Other Topics Concern   Not on file  Social History Narrative   Not on file   Social Determinants of Health   Financial Resource Strain: Not on file  Food Insecurity: Not on file  Transportation Needs: Not on file  Physical Activity: Not on file  Stress: Not on file  Social Connections: Not on file  Intimate Partner Violence: Not on file    FAMILY HISTORY: Family History  Problem Relation Age of Onset   Esophageal cancer Father    Emphysema Mother    Stroke Brother     ALLERGIES:  has No Known Allergies.  MEDICATIONS:  Current Outpatient Medications  Medication Sig Dispense Refill   alfuzosin (UROXATRAL) 10 MG 24 hr tablet Take 10 mg by mouth daily with breakfast.     amLODipine (NORVASC) 5 MG tablet Take 5 mg by mouth daily.     chlorthalidone (HYGROTON) 25 MG tablet Take 12.5 mg by mouth daily.     finasteride (PROSCAR) 5 MG tablet Take 5 mg by mouth daily.     lisinopril (PRINIVIL,ZESTRIL) 40 MG tablet Take 40 mg by mouth daily.  metoprolol succinate (TOPROL-XL) 25 MG 24 hr tablet Alternate taking '25mg'$  and 12.'5mg'$  daily 135 tablet 3   Rivaroxaban (XARELTO) 15 MG TABS tablet Take 1 tablet (15 mg total) by mouth daily with supper. 90 tablet 3   propafenone (RYTHMOL) 150 MG tablet TAKE 1 TABLET (150 MG TOTAL) BY MOUTH EVERY 8 (EIGHT) HOURS. (Patient not taking: Reported on 11/03/2021) 270 tablet 3   No current facility-administered medications for this visit.    REVIEW OF SYSTEMS:    .10 Point review of Systems was done is negative except as noted above.   PHYSICAL EXAMINATION: ECOG PERFORMANCE STATUS:   . Vitals:   11/23/21 0906  BP: 130/65  Pulse: 60  Resp: 18  Temp: (!) 97.5 F (36.4 C)   SpO2: 99%   Filed Weights   11/23/21 0906  Weight: 186 lb (84.4 kg)   .Body mass index is 25.94 kg/m. NAD GENERAL:alert, in no acute distress and comfortable SKIN: no acute rashes, no significant lesions EYES: conjunctiva are pink and non-injected, sclera anicteric OROPHARYNX: MMM, no exudates, no oropharyngeal erythema or ulceration NECK: supple, no JVD LYMPH:  no palpable lymphadenopathy in the cervical, axillary or inguinal regions LUNGS: clear to auscultation b/l with normal respiratory effort HEART: regular rate & rhythm ABDOMEN:  normoactive bowel sounds , non tender, not distended. Extremity: no pedal edema PSYCH: alert & oriented x 3 with fluent speech NEURO: no focal motor/sensory deficits  LABORATORY DATA:  I have reviewed the data as listed  .    Latest Ref Rng & Units 11/23/2021    8:49 AM 08/23/2021    3:07 PM 05/25/2021    2:38 PM  CBC  WBC 4.0 - 10.5 K/uL 7.3  7.4  6.9   Hemoglobin 13.0 - 17.0 g/dL 15.2  14.4  15.0   Hematocrit 39.0 - 52.0 % 42.0  40.5  41.7   Platelets 150 - 400 K/uL 165  162  168     .    Latest Ref Rng & Units 11/23/2021    8:49 AM 08/23/2021    3:07 PM 05/25/2021    2:38 PM  CMP  Glucose 70 - 99 mg/dL 144  125  135   BUN 8 - 23 mg/dL '15  14  20   '$ Creatinine 0.61 - 1.24 mg/dL 1.04  0.94  1.02   Sodium 135 - 145 mmol/L 134  137  136   Potassium 3.5 - 5.1 mmol/L 3.4  3.6  3.8   Chloride 98 - 111 mmol/L 97  100  100   CO2 22 - 32 mmol/L 31  32  30   Calcium 8.9 - 10.3 mg/dL 9.9  9.7  9.1   Total Protein 6.5 - 8.1 g/dL 7.6  7.4  7.3   Total Bilirubin 0.3 - 1.2 mg/dL 1.2  1.0  1.5   Alkaline Phos 38 - 126 U/L 77  88  66   AST 15 - 41 U/L 29  29  35   ALT 0 - 44 U/L 26  24  33    . Lab Results  Component Value Date   IRON 151 11/23/2021   TIBC 273 11/23/2021   IRONPCTSAT 55 (H) 11/23/2021   (Iron and TIBC)  Lab Results  Component Value Date   FERRITIN 73 11/23/2021     RADIOGRAPHIC STUDIES: I have personally  reviewed the radiological images as listed and agreed with the findings in the report. No results found.  ASSESSMENT & PLAN:  88 year old very pleasant gentleman with  1) Hereditary hemochromatosis with compound heterozygous state for C282Y and H63D mutations 2) history of chronic active hepatitis and Iron overload on liver biopsy in 2011. Currently LFTs wnl. PLAN -Patient's labs done today were discussed in detail with, No fevers no chills no night sweats no unexpected weight loss no abdominal pain or distention. Blood counts today were normal and ferritin including ferritin of 743 and iron saturation of 55%. Patient had stable ferritin of 73 with no other significant focal issues at this time. -We will change the therapeutic phlebotomies to every 4 months  FOLLOWUP  Therapeutic phlebotomy today We will switch therapeutic phlebotomies every 16 weeks with labs. Please schedule next 3 therapeutic phlebotomies. Will need to move out the currently scheduled appointment on 02/23/2022 out an additional month. Ultrasound abdomen with elastography in 14 weeks MD visit in 16 weeks with the next therapeutic phlebotomy  The total time spent in the appointment was 20 minutes*.  All of the patient's questions were answered with apparent satisfaction. The patient knows to call the clinic with any problems, questions or concerns.   Sullivan Lone MD MS AAHIVMS [facility] Hematology/Oncology Physician [facility]  .*Total Encounter Time as defined by the Centers for Medicare and Medicaid Services includes, in addition to the face-to-face time of a patient visit (documented in the note above) non-face-to-face time: obtaining and reviewing outside history, ordering and reviewing medications, tests or procedures, care coordination (communications with other health care professionals or caregivers) and documentation in the medical record.

## 2021-12-14 ENCOUNTER — Other Ambulatory Visit: Payer: Self-pay | Admitting: Internal Medicine

## 2021-12-14 DIAGNOSIS — I10 Essential (primary) hypertension: Secondary | ICD-10-CM | POA: Diagnosis not present

## 2021-12-14 DIAGNOSIS — G4733 Obstructive sleep apnea (adult) (pediatric): Secondary | ICD-10-CM | POA: Diagnosis not present

## 2021-12-14 DIAGNOSIS — Z0000 Encounter for general adult medical examination without abnormal findings: Secondary | ICD-10-CM | POA: Diagnosis not present

## 2021-12-14 DIAGNOSIS — N401 Enlarged prostate with lower urinary tract symptoms: Secondary | ICD-10-CM | POA: Diagnosis not present

## 2021-12-14 DIAGNOSIS — D6869 Other thrombophilia: Secondary | ICD-10-CM | POA: Diagnosis not present

## 2021-12-14 DIAGNOSIS — R7303 Prediabetes: Secondary | ICD-10-CM | POA: Diagnosis not present

## 2021-12-14 DIAGNOSIS — I480 Paroxysmal atrial fibrillation: Secondary | ICD-10-CM | POA: Diagnosis not present

## 2021-12-21 DIAGNOSIS — H40013 Open angle with borderline findings, low risk, bilateral: Secondary | ICD-10-CM | POA: Diagnosis not present

## 2021-12-21 DIAGNOSIS — H52203 Unspecified astigmatism, bilateral: Secondary | ICD-10-CM | POA: Diagnosis not present

## 2021-12-21 DIAGNOSIS — H524 Presbyopia: Secondary | ICD-10-CM | POA: Diagnosis not present

## 2021-12-21 DIAGNOSIS — H02831 Dermatochalasis of right upper eyelid: Secondary | ICD-10-CM | POA: Diagnosis not present

## 2021-12-21 DIAGNOSIS — Z961 Presence of intraocular lens: Secondary | ICD-10-CM | POA: Diagnosis not present

## 2021-12-21 DIAGNOSIS — H5203 Hypermetropia, bilateral: Secondary | ICD-10-CM | POA: Diagnosis not present

## 2021-12-21 DIAGNOSIS — H02834 Dermatochalasis of left upper eyelid: Secondary | ICD-10-CM | POA: Diagnosis not present

## 2022-02-02 DIAGNOSIS — I10 Essential (primary) hypertension: Secondary | ICD-10-CM | POA: Diagnosis not present

## 2022-02-02 DIAGNOSIS — E785 Hyperlipidemia, unspecified: Secondary | ICD-10-CM | POA: Diagnosis not present

## 2022-02-02 DIAGNOSIS — R7303 Prediabetes: Secondary | ICD-10-CM | POA: Diagnosis not present

## 2022-02-02 DIAGNOSIS — I480 Paroxysmal atrial fibrillation: Secondary | ICD-10-CM | POA: Diagnosis not present

## 2022-02-14 DIAGNOSIS — L821 Other seborrheic keratosis: Secondary | ICD-10-CM | POA: Diagnosis not present

## 2022-02-14 DIAGNOSIS — Z85828 Personal history of other malignant neoplasm of skin: Secondary | ICD-10-CM | POA: Diagnosis not present

## 2022-02-14 DIAGNOSIS — D1801 Hemangioma of skin and subcutaneous tissue: Secondary | ICD-10-CM | POA: Diagnosis not present

## 2022-02-14 DIAGNOSIS — D225 Melanocytic nevi of trunk: Secondary | ICD-10-CM | POA: Diagnosis not present

## 2022-02-14 DIAGNOSIS — L570 Actinic keratosis: Secondary | ICD-10-CM | POA: Diagnosis not present

## 2022-03-01 ENCOUNTER — Encounter: Payer: Self-pay | Admitting: Hematology

## 2022-03-01 ENCOUNTER — Ambulatory Visit
Admission: RE | Admit: 2022-03-01 | Discharge: 2022-03-01 | Disposition: A | Discharge status: Home / Self Care | Payer: Medicare Other | Source: Ambulatory Visit | Attending: Internal Medicine | Admitting: Internal Medicine

## 2022-03-01 DIAGNOSIS — I10 Essential (primary) hypertension: Secondary | ICD-10-CM

## 2022-03-08 DIAGNOSIS — I2510 Atherosclerotic heart disease of native coronary artery without angina pectoris: Secondary | ICD-10-CM | POA: Diagnosis not present

## 2022-03-14 ENCOUNTER — Other Ambulatory Visit: Payer: Self-pay

## 2022-03-14 ENCOUNTER — Telehealth: Payer: Self-pay | Admitting: Cardiovascular Disease

## 2022-03-14 DIAGNOSIS — N410 Acute prostatitis: Secondary | ICD-10-CM | POA: Diagnosis not present

## 2022-03-14 DIAGNOSIS — R3911 Hesitancy of micturition: Secondary | ICD-10-CM | POA: Diagnosis not present

## 2022-03-14 DIAGNOSIS — E785 Hyperlipidemia, unspecified: Secondary | ICD-10-CM | POA: Diagnosis not present

## 2022-03-14 NOTE — Telephone Encounter (Signed)
LMTCB

## 2022-03-14 NOTE — Telephone Encounter (Signed)
A referral was received from patient's PCP stating "Agatston score 720 - has December appt. ? -does he need to be seen sooner? send copy of calcium score." Patient is currently scheduled to see Dr. Claiborne Billings on 12/04. Please advise if sooner appt is needed due to referral.

## 2022-03-15 ENCOUNTER — Inpatient Hospital Stay (HOSPITAL_BASED_OUTPATIENT_CLINIC_OR_DEPARTMENT_OTHER): Payer: Medicare Other | Admitting: Hematology

## 2022-03-15 ENCOUNTER — Inpatient Hospital Stay: Payer: Medicare Other

## 2022-03-15 ENCOUNTER — Other Ambulatory Visit: Payer: Self-pay

## 2022-03-15 ENCOUNTER — Inpatient Hospital Stay: Payer: Medicare Other | Attending: Hematology

## 2022-03-15 DIAGNOSIS — Z79899 Other long term (current) drug therapy: Secondary | ICD-10-CM | POA: Diagnosis not present

## 2022-03-15 LAB — IRON AND IRON BINDING CAPACITY (CC-WL,HP ONLY)
Iron: 155 ug/dL (ref 45–182)
Saturation Ratios: 61 % — ABNORMAL HIGH (ref 17.9–39.5)
TIBC: 255 ug/dL (ref 250–450)
UIBC: 100 ug/dL — ABNORMAL LOW (ref 117–376)

## 2022-03-15 LAB — CBC WITH DIFFERENTIAL (CANCER CENTER ONLY)
Abs Immature Granulocytes: 0.04 10*3/uL (ref 0.00–0.07)
Basophils Absolute: 0 10*3/uL (ref 0.0–0.1)
Basophils Relative: 1 %
Eosinophils Absolute: 0.2 10*3/uL (ref 0.0–0.5)
Eosinophils Relative: 2 %
HCT: 41.2 % (ref 39.0–52.0)
Hemoglobin: 14.9 g/dL (ref 13.0–17.0)
Immature Granulocytes: 1 %
Lymphocytes Relative: 14 %
Lymphs Abs: 1 10*3/uL (ref 0.7–4.0)
MCH: 32 pg (ref 26.0–34.0)
MCHC: 36.2 g/dL — ABNORMAL HIGH (ref 30.0–36.0)
MCV: 88.4 fL (ref 80.0–100.0)
Monocytes Absolute: 0.8 10*3/uL (ref 0.1–1.0)
Monocytes Relative: 11 %
Neutro Abs: 5.4 10*3/uL (ref 1.7–7.7)
Neutrophils Relative %: 71 %
Platelet Count: 199 10*3/uL (ref 150–400)
RBC: 4.66 MIL/uL (ref 4.22–5.81)
RDW: 12 % (ref 11.5–15.5)
WBC Count: 7.5 10*3/uL (ref 4.0–10.5)
nRBC: 0 % (ref 0.0–0.2)

## 2022-03-15 LAB — CMP (CANCER CENTER ONLY)
ALT: 18 U/L (ref 0–44)
AST: 21 U/L (ref 15–41)
Albumin: 3.9 g/dL (ref 3.5–5.0)
Alkaline Phosphatase: 101 U/L (ref 38–126)
Anion gap: 6 (ref 5–15)
BUN: 22 mg/dL (ref 8–23)
CO2: 32 mmol/L (ref 22–32)
Calcium: 9.4 mg/dL (ref 8.9–10.3)
Chloride: 97 mmol/L — ABNORMAL LOW (ref 98–111)
Creatinine: 1.09 mg/dL (ref 0.61–1.24)
GFR, Estimated: >60 mL/min (ref >60)
Glucose, Bld: 139 mg/dL — ABNORMAL HIGH (ref 70–99)
Potassium: 3.2 mmol/L — ABNORMAL LOW (ref 3.5–5.1)
Sodium: 135 mmol/L (ref 135–145)
Total Bilirubin: 1.6 mg/dL — ABNORMAL HIGH (ref 0.3–1.2)
Total Protein: 7.6 g/dL (ref 6.5–8.1)

## 2022-03-15 LAB — FERRITIN: Ferritin: 134 ng/mL (ref 24–336)

## 2022-03-15 NOTE — Telephone Encounter (Signed)
Left message for pt to call to discuss moving appointment up.

## 2022-03-15 NOTE — Patient Instructions (Signed)
Therapeutic Phlebotomy, Care After The following information offers guidance on how to care for yourself after your procedure. Your health care provider may also give you more specific instructions. If you have problems or questions, contact your health care provider. What can I expect after the procedure? After therapeutic phlebotomy, it is common to have: Light-headedness or dizziness. You may feel faint. Nausea. Tiredness (fatigue). Follow these instructions at home: Eating and drinking Be sure to eat well-balanced meals for the next 24 hours. Drink enough fluid to keep your urine pale yellow. Avoid drinking alcohol on the day that you had the procedure. Activity  Return to your normal activities as told by your health care provider. Most people can go back to their normal activities right away. Avoid activities that take a lot of effort for about 5 hours after the procedure. Athletes should avoid strenuous exercise for at least 12 hours. Avoid heavy lifting or pulling for about 5 hours after the procedure. Do not lift anything that is heavier than 10 lb (4.5 kg). Change positions slowly for the remainder of the day, like from sitting to standing. This can help prevent light-headedness or fainting. If you feel light-headed, lie down until the feeling goes away. Needle insertion site care  Keep your bandage (dressing) dry. You can remove the bandage after about 5 hours or as told by your health care provider. If you have bleeding from the needle insertion site, raise (elevate) your arm and press firmly on the site until the bleeding stops. If you have bruising at the site, apply ice to the area. To do this: Put ice in a plastic bag. Place a towel between your skin and the bag. Leave the ice on for 20 minutes, 2-3 times a day for the first 24 hours. Remove the ice if your skin turns bright red so you do not damage the area. If the swelling does not go away after 24 hours, apply a warm,  moist cloth (warm compress) to the area for 20 minutes, 2-3 times a day. General instructions Do not use any products that contain nicotine or tobacco, like cigarettes, chewing tobacco, and vaping devices, such as e-cigarettes, for at least 30 minutes after the procedure. If you need help quitting, ask your health care provider. Keep all follow-up visits. You may need to continue having regular blood tests and therapeutic phlebotomy treatments as directed. Contact a health care provider if: You have redness, swelling, or pain at the needle insertion site. Fluid or blood is coming from the needle insertion site. Pus or a bad smell is coming from the needle insertion site. The needle insertion site feels warm to the touch. You feel light-headed, dizzy, or nauseous, and the feeling does not go away. You have new bruising at the needle insertion site. You feel weaker than normal. You have a fever or chills. Get help right away if: You have chest pain. You have trouble breathing. You have severe nausea or vomiting. Summary After the procedure, it is common to have some light-headedness, dizziness, nausea, or tiredness (fatigue). Be sure to eat well-balanced meals for the next 24 hours. Drink enough fluid to keep your urine pale yellow. Return to your normal activities as told by your health care provider. Keep all follow-up visits. You may need to continue having regular blood tests and therapeutic phlebotomy treatments as directed. This information is not intended to replace advice given to you by your health care provider. Make sure you discuss any questions you have   with your health care provider. Document Revised: 11/17/2020 Document Reviewed: 11/17/2020 Elsevier Patient Education  [place]. Therapeutic Phlebotomy Discharge Instructions  - Increase your fluid intake over the next 4 hours  - No smoking for 30 minutes  - Avoid using the affected arm (the one you had the blood  drawn from) for heavy lifting or other activities.  - You may resume all normal activities after 30 minutes.  You are to notify the office if you experience:   - Persistent dizziness and/or lightheadedness -Uncontrolled or excessive bleeding at the site.

## 2022-03-15 NOTE — Telephone Encounter (Signed)
Pt returning call

## 2022-03-15 NOTE — Telephone Encounter (Signed)
Left message for pt to call.

## 2022-03-15 NOTE — Progress Notes (Signed)
HEMATOLOGY/ONCOLOGY CLINIC NOTE  Date of Service: 03/15/22    Patient Care Team: Deland Pretty, MD as PCP - General (Internal Medicine) Troy Sine, MD as PCP - Cardiology (Cardiology)  CHIEF COMPLAINTS/PURPOSE OF CONSULTATION:  Transfer of care for Hereditary hemochromatosis for continued management.  HISTORY OF PRESENTING ILLNESS:   Andrew [name] is here for continued evaluation and management of his hereditary hemochromatosis.  He was last seen by me on 11/23/2021 and was doing well with no new symptoms.  Today, he reports he has been doing well overall. He denies any new changes with his hereditary hemochromatosis.  He denies of any new issues with recent phlebotomies. He is staying well hydrated.  He reports that he was recently prescribed statin and had issues with muscle aches.  He reports that he has been sleeping better after quitting caffeine and focusing on sleep hygiene.   He reports he was recently diagnosed with prostatitis.   He has been up to date with his immunizations. He is scheduled for the influenza vaccine next week.  He denies abdominal pain. He has cut down on the consumption of red meat. He is slowly trying to convert to be an vegetarian.    MEDICAL HISTORY:  Past Medical History:  Diagnosis Date   Adenomatous colon polyp 10/1993   Diverticulosis    Hemochromatosis    Compound Heterozygous for C282Y & H63D mutations   Hepatitis B antibody positive    Hypertension    OSA treated with BiPAP    PAF (paroxysmal atrial fibrillation) ([place])     SURGICAL HISTORY: Past Surgical History:  Procedure Laterality Date   CARDIOVERSION  08/18/2011   Procedure: CARDIOVERSION;  Surgeon: Troy Sine, MD;  Location: [place];  Service: Cardiovascular;  Laterality: N/A;   CARDIOVERSION  11/22/2011   Procedure: CARDIOVERSION;  Surgeon: Troy Sine, MD;  Location: [place];  Service: Cardiovascular;  Laterality: N/A;   COLONOSCOPY     KNEE ARTHROSCOPY      LAPAROSCOPIC CHOLECYSTECTOMY  04/2009   NM MYOCAR PERF WALL MOTION  05/08/2008   Normal   TONSILLECTOMY     US ECHOCARDIOGRAPHY  07/27/2011   Mild concentric LVH, LA & RA is moderately dilated, mild to mod MR.    SOCIAL HISTORY: Social History   Socioeconomic History   Marital status: Married    Spouse name: Not on file   Number of children: Not on file   Years of education: Not on file   Highest education level: Not on file  Occupational History   Occupation: Retired    Fish farm manager: RETIRED  Tobacco Use   Smoking status: Former   Smokeless tobacco: Never  Substance and Sexual Activity   Alcohol use: Yes    Alcohol/week: 0.0 standard drinks of alcohol    Comment: 2 glasses if wine daily   Drug use: No   Sexual activity: Not on file  Other Topics Concern   Not on file  Social History Narrative   Not on file   Social Determinants of Health   Financial Resource Strain: Not on file  Food Insecurity: Not on file  Transportation Needs: Not on file  Physical Activity: Not on file  Stress: Not on file  Social Connections: Not on file  Intimate Partner Violence: Not on file    FAMILY HISTORY: Family History  Problem Relation Age of Onset   Esophageal cancer Father    Emphysema Mother    Stroke Brother     ALLERGIES:  has No Known Allergies.  MEDICATIONS:  Current Outpatient Medications  Medication Sig Dispense Refill   alfuzosin (UROXATRAL) 10 MG 24 hr tablet Take 10 mg by mouth daily with breakfast.     amLODipine (NORVASC) 5 MG tablet Take 5 mg by mouth daily.     chlorthalidone (HYGROTON) 25 MG tablet Take 12.5 mg by mouth daily.     finasteride (PROSCAR) 5 MG tablet Take 5 mg by mouth daily.     lisinopril (PRINIVIL,ZESTRIL) 40 MG tablet Take 40 mg by mouth daily.     metoprolol succinate (TOPROL-XL) 25 MG 24 hr tablet Alternate taking '25mg'$  and 12.'5mg'$  daily 135 tablet 3   Rivaroxaban (XARELTO) 15 MG TABS tablet Take 1 tablet (15 mg total) by mouth daily with  supper. 90 tablet 3   propafenone (RYTHMOL) 150 MG tablet TAKE 1 TABLET (150 MG TOTAL) BY MOUTH EVERY 8 (EIGHT) HOURS. (Patient not taking: Reported on 11/03/2021) 270 tablet 3   No current facility-administered medications for this visit.    REVIEW OF SYSTEMS:    .10 Point review of Systems was done is negative except as noted above.   PHYSICAL EXAMINATION: ECOG PERFORMANCE STATUS:   . Vitals:   03/15/22 0927  BP: 119/72  Pulse: 64  Resp: 18  Temp: 97.7 F (36.5 C)  SpO2: 99%    Filed Weights   03/15/22 0927  Weight: 186 lb 1.6 oz (84.4 kg)    .Body mass index is 25.96 kg/m. NAD GENERAL:alert, in no acute distress and comfortable SKIN: no acute rashes, no significant lesions EYES: conjunctiva are pink and non-injected, sclera anicteric OROPHARYNX: MMM, no exudates, no oropharyngeal erythema or ulceration NECK: supple, no JVD LYMPH:  no palpable lymphadenopathy in the cervical, axillary or inguinal regions LUNGS: clear to auscultation b/l with normal respiratory effort HEART: regular rate & rhythm ABDOMEN:  normoactive bowel sounds , non tender, not distended. Extremity: no pedal edema PSYCH: alert & oriented x 3 with fluent speech NEURO: no focal motor/sensory deficits  LABORATORY DATA:  I have reviewed the data as listed  .    Latest Ref Rng & Units 03/15/2022    8:48 AM 11/23/2021    8:49 AM 08/23/2021    3:07 PM  CBC  WBC 4.0 - 10.5 K/uL 7.5  7.3  7.4   Hemoglobin 13.0 - 17.0 g/dL 14.9  15.2  14.4   Hematocrit 39.0 - 52.0 % 41.2  42.0  40.5   Platelets 150 - 400 K/uL 199  165  162     .    Latest Ref Rng & Units 03/15/2022    8:48 AM 11/23/2021    8:49 AM 08/23/2021    3:07 PM  CMP  Glucose 70 - 99 mg/dL 139  144  125   BUN 8 - 23 mg/dL '22  15  14   '$ Creatinine 0.61 - 1.24 mg/dL 1.09  1.04  0.94   Sodium 135 - 145 mmol/L 135  134  137   Potassium 3.5 - 5.1 mmol/L 3.2  3.4  3.6   Chloride 98 - 111 mmol/L 97  97  100   CO2 22 - 32 mmol/L 32  31   32   Calcium 8.9 - 10.3 mg/dL 9.4  9.9  9.7   Total Protein 6.5 - 8.1 g/dL 7.6  7.6  7.4   Total Bilirubin 0.3 - 1.2 mg/dL 1.6  1.2  1.0   Alkaline Phos 38 - 126 U/L 101  77  88  AST 15 - 41 U/L '21  29  29   '$ ALT 0 - 44 U/L '18  26  24    '$ . Lab Results  Component Value Date   IRON 155 03/15/2022   TIBC 255 03/15/2022   IRONPCTSAT 61 (H) 03/15/2022   (Iron and TIBC)  Lab Results  Component Value Date   FERRITIN 134 03/15/2022     RADIOGRAPHIC STUDIES: I have personally reviewed the radiological images as listed and agreed with the findings in the report. CT CARDIAC SCORING ([facility] LOCATIONS ONLY)  Result Date: 03/01/2022 CLINICAL DATA:  88 year old white male with essential hypertension. * Tracking Code: [place] * EXAM: CT CARDIAC CORONARY ARTERY CALCIUM SCORE TECHNIQUE: Non-contrast imaging through the heart was performed using prospective ECG gating. Image post processing was performed on an independent workstation, allowing for quantitative analysis of the heart and coronary arteries. Note that this exam targets the heart and the chest was not imaged in its entirety. COMPARISON:  None Available. FINDINGS: CORONARY CALCIUM SCORES: Left Main: 0 LAD: 425 LCx: 87.7 RCA: 207 Total Agatston Score: 720 MESA database percentile: 56-calculated for an 88 year old male AORTA MEASUREMENTS: Ascending Aorta: 3.5 cm Descending Aorta: 3.1 cm OTHER FINDINGS: Slightly limited evaluation of the right coronary arteries due to motion artifact in this area. Heart size is within normal limits. Small hiatal hernia. No lymph node enlargement in the visualized mediastinum. Images of the upper abdomen are unremarkable. Multiple tiny pulmonary nodules scattered throughout the lungs are likely incidental findings and some of these are calcified. Index nodule measures 3 mm in the right lung sequence 9 image 10. No airspace disease or consolidation in the visualized lungs. No large pleural effusions. Bridging osteophytes  throughout the thoracic spine. No acute bone abnormality. IMPRESSION: Coronary calcium score is 720 and this is at percentile 56 for patients of the same age, gender and ethnicity. Please note the calcium score is based on a 88 year old because that the upper limits of age for [facility]. Small hiatal hernia. Multiple tiny pulmonary nodules are likely incidental findings. Per Fleischner Society Guidelines, no routine follow-up imaging is recommended. These guidelines do not apply to immunocompromised patients and patients with cancer. Follow up in patients with significant comorbidities as clinically warranted. For lung cancer screening, adhere to Lung-RADS guidelines. Reference: Radiology. 2017; 284(1):228-43. Electronically Signed   By: Markus Daft M.D.   On: 03/01/2022 19:06    ASSESSMENT & PLAN:   88 year old very pleasant gentleman with:  1) Hereditary hemochromatosis with compound heterozygous state for C282Y and H63D mutations 2) history of chronic active hepatitis and Iron overload on liver biopsy in 2011. Currently LFTs wnl.  PLAN -Discussed patient's lab results from today, which showed stable CBC and CMP. -Will wait for ferritin results from today. Patient had stable ferritin of 73 with no other significant focal issues on 11/23/2021. -Will repeat ultrasound before his next visit.  -He will have his therapeutic phlebotomy today and we will change the therapeutic phlebotomies to every 6 months.  FOLLOWUP Plz change therapeutic phlebotomy appointments to every 6 months with labsx 2 [place] abd with elastography in 5 months MD visit in 6 months with next therapeutic phlebotomy.   The total time spent in the appointment was 21 minutes* .  All of the patient's questions were answered with apparent satisfaction. The patient knows to call the clinic with any problems, questions or concerns.   I,Param Shah,acting as a Education administrator for Sullivan Lone, MD.,have documented all relevant documentation on the  behalf  of Sullivan Lone, MD,as directed by  Sullivan Lone, MD while in the presence of Sullivan Lone, MD.  Sullivan Lone MD MS AAHIVMS [facility] Hematology/Oncology Physician [facility]  .*Total Encounter Time as defined by the Centers for Medicare and Medicaid Services includes, in addition to the face-to-face time of a patient visit (documented in the note above) non-face-to-face time: obtaining and reviewing outside history, ordering and reviewing medications, tests or procedures, care coordination (communications with other health care professionals or caregivers) and documentation in the medical record.

## 2022-03-15 NOTE — Telephone Encounter (Signed)
Spoke with pt, he is willing to see  Raquel Sarna NP now and then keep his appointment with dr Claiborne Billings in December. Follow up scheduled

## 2022-03-15 NOTE — Progress Notes (Signed)
Andrew [name] presents today for phlebotomy per MD orders. Phlebotomy procedure started at 1023 and ended at 1032. 502 grams removed via 16 G needle to RAC. IV needle removed intact. Patient provided with food and drink following procedure.  Patient observed until 10:57 per patient request to leave. Vital signs retaken and remained stable upon discharge. Patient tolerated procedure well.

## 2022-03-16 ENCOUNTER — Telehealth: Payer: Self-pay | Admitting: Nurse Practitioner

## 2022-03-16 NOTE — Telephone Encounter (Signed)
Pt c/o BP issue: STAT if pt c/o blurred vision, one-sided weakness or slurred speech  1. What are your last 5 BP readings?  90/47  hr71   2. Are you having any other symptoms (ex. Dizziness, headache, blurred vision, passed out)? Dizziness   3. What is your BP issue? Pt was told to call Raquel Sarna back if his bp dropped below 100.

## 2022-03-16 NOTE — Telephone Encounter (Signed)
Phlebotomy yesterday - One unit of blood taken off.  Was dizzy yesterday while there which was unusual but he states BP was okay.   He states Raquel Sarna asked him to report any readings <100 SBP.  BP today 90/71.  Patient reports feeling lightheaded today but that he is sitting reading.  Encouraged him to sit with legs elevated today.  Drink extra fluids and eat well today.    Takes all BP meds in evening.  I asked him to hold amlodipine and chlorthalidone tonight.  Take other BP meds (Toprol XL and lisinopril) only if SBP is >100.  Adv to call back if any concerns or changes.

## 2022-03-22 ENCOUNTER — Encounter: Payer: Self-pay | Admitting: Hematology

## 2022-03-24 DIAGNOSIS — N451 Epididymitis: Secondary | ICD-10-CM | POA: Diagnosis not present

## 2022-03-24 DIAGNOSIS — R3914 Feeling of incomplete bladder emptying: Secondary | ICD-10-CM | POA: Diagnosis not present

## 2022-03-24 DIAGNOSIS — Z23 Encounter for immunization: Secondary | ICD-10-CM | POA: Diagnosis not present

## 2022-03-24 DIAGNOSIS — N401 Enlarged prostate with lower urinary tract symptoms: Secondary | ICD-10-CM | POA: Diagnosis not present

## 2022-03-27 ENCOUNTER — Telehealth: Payer: Self-pay | Admitting: Nurse Practitioner

## 2022-03-27 NOTE — Telephone Encounter (Signed)
Patient called in, he states he would l ike to know if he can start back on his Alfuzosin medication now that he is done with his antibiotic. Patient advised we did not prescribe this medication, I recommended he contact Dr.Pharr's office and they would advise of this. Patient states this was his only concern.   Thanks!   Will route to NP to notify of BP results as FYI.

## 2022-03-27 NOTE — Telephone Encounter (Signed)
Pt c/o BP issue: STAT if pt c/o blurred vision, one-sided weakness or slurred speech  1. What are your last 5 BP readings?  Sat: 154/86  hr50  This morning: 168/87  hr50 (pt takes his medication at night)   2. Are you having any other symptoms (ex. Dizziness, headache, blurred vision, passed out)? No   3. What is your BP issue? Pt states he was told to Call Monge, NP back if his bp got low.

## 2022-03-29 NOTE — Telephone Encounter (Signed)
Lmom, waiting on a return call.  

## 2022-03-30 ENCOUNTER — Other Ambulatory Visit: Payer: Self-pay

## 2022-03-30 NOTE — Telephone Encounter (Signed)
FYI, Pt returned call. Pt is aware of Diona Browner NP recommendations of monitoring BP, reporting BP consistently greater than 130/80 and increasing the Amlodipine to see if he tolerates medication better. Pt has an appointment on 04/04/22 and wants to discuss it further at his appointment. Pt also said he has enough of the Amlodipine 5 mg at home and he can cut another pill in half if he decides to increase the Amlodipine prior to his appointment on 04/04/22.

## 2022-04-04 ENCOUNTER — Ambulatory Visit: Payer: Medicare Other | Attending: Nurse Practitioner | Admitting: Nurse Practitioner

## 2022-04-04 ENCOUNTER — Encounter: Payer: Self-pay | Admitting: Nurse Practitioner

## 2022-04-04 VITALS — BP 128/84 | HR 60 | Ht 71.0 in | Wt 183.0 lb

## 2022-04-04 DIAGNOSIS — I480 Paroxysmal atrial fibrillation: Secondary | ICD-10-CM | POA: Diagnosis not present

## 2022-04-04 DIAGNOSIS — E785 Hyperlipidemia, unspecified: Secondary | ICD-10-CM | POA: Diagnosis not present

## 2022-04-04 DIAGNOSIS — G4733 Obstructive sleep apnea (adult) (pediatric): Secondary | ICD-10-CM | POA: Diagnosis not present

## 2022-04-04 DIAGNOSIS — Z7901 Long term (current) use of anticoagulants: Secondary | ICD-10-CM | POA: Insufficient documentation

## 2022-04-04 DIAGNOSIS — I10 Essential (primary) hypertension: Secondary | ICD-10-CM | POA: Diagnosis not present

## 2022-04-04 DIAGNOSIS — I4821 Permanent atrial fibrillation: Secondary | ICD-10-CM | POA: Insufficient documentation

## 2022-04-04 DIAGNOSIS — R7303 Prediabetes: Secondary | ICD-10-CM | POA: Diagnosis not present

## 2022-04-04 NOTE — Progress Notes (Signed)
Office Visit    Patient Name: Andrew [name] Date of Encounter: 04/04/2022  Primary Care Provider:  Deland Pretty, MD Primary Cardiologist:  Shelva Majestic, MD  Chief Complaint    88 year old male with a history of permanent atrial fibrillation, hypertension, OSA, and hereditary hemochromatosis who presents for follow-up related to atrial fibrillation and hypertension.  Past Medical History    Past Medical History:  Diagnosis Date   Adenomatous colon polyp 10/1993   Diverticulosis    Hemochromatosis    Compound Heterozygous for C282Y & H63D mutations   Hepatitis B antibody positive    Hypertension    OSA treated with BiPAP    PAF (paroxysmal atrial fibrillation) ([place])    Past Surgical History:  Procedure Laterality Date   CARDIOVERSION  08/18/2011   Procedure: CARDIOVERSION;  Surgeon: Troy Sine, MD;  Location: [place];  Service: Cardiovascular;  Laterality: N/A;   CARDIOVERSION  11/22/2011   Procedure: CARDIOVERSION;  Surgeon: Troy Sine, MD;  Location: [facility];  Service: Cardiovascular;  Laterality: N/A;   COLONOSCOPY     KNEE ARTHROSCOPY     LAPAROSCOPIC CHOLECYSTECTOMY  04/2009   NM MYOCAR PERF WALL MOTION  05/08/2008   Normal   TONSILLECTOMY     US ECHOCARDIOGRAPHY  07/27/2011   Mild concentric LVH, LA & RA is moderately dilated, mild to mod MR.    Allergies  No Known Allergies  History of Present Illness    88 year old male with the above past medical history including  now permanent atrial fibrillation, hypertension, OSA, and hereditary hemochromatosis.    He has a history of normal stress test in 2009.  He was diagnosed with atrial fibrillation in 2013.  Echocardiogram at the time showed EF greater than 55%, mild MR, mild TR. He was initiated on beta-blocker therapy and Xarelto at the time.  He developed recurrent atrial fibrillation after his initial cardioversion.  Additionally, he was found to have obstructive sleep apnea and was started on CPAP/BiPAP  therapy prior to a second cardioversion.  Second cardioversion in June 2013 was successful and with treatment of sleep apnea, he had maintained sinus rhythm.  He was last seen in the office on 11/03/2021 and was stable from a cardiac standpoint.  He was noted to be in atrial fibrillation, however, patient was unaware-had likely been in atrial fibrillation for a while. Through shared decision making, the decision was made to pursue a rate control strategy.  He did note occasional tiredness, slight increase in resting heart rate. BP was borderline low.  Chlorthalidone was decreased to 12.5 mg daily. He contacted our office on 03/27/2022 and noted mildly elevated BP.  He was advised to start amlodipine 5 mg daily.    He presents today for follow-up. Since his last visit he has been stable from a cardiac standpoint. He saw his PCP who recently performed a coronary calcium score that revealed a calcium score of 720 (86 percentile).  He was started on rosuvastatin.  Shortly after this he developed a UTI and subsequently had to undergo dental surgery.  He states "it has been a rough couple of months."  He is now feeling better.  He has not been taking his chlorthalidone and instead restarted his amlodipine.  He has noticed an improvement in his fatigue, BP has also been well controlled since restarting amlodipine.  He denies any chest pain, dyspnea, palpitations.  Overall, he reports feeling well.  Home Medications    Current Outpatient Medications  Medication Sig  Dispense Refill   amLODipine (NORVASC) 5 MG tablet Take 5 mg by mouth daily.     finasteride (PROSCAR) 5 MG tablet Take 5 mg by mouth daily.     HYDROcodone-acetaminophen (NORCO/VICODIN) 5-325 MG tablet Take 1 tablet by mouth every 4 (four) hours as needed.     lisinopril (PRINIVIL,ZESTRIL) 40 MG tablet Take 40 mg by mouth daily.     metoprolol succinate (TOPROL-XL) 25 MG 24 hr tablet Alternate taking '25mg'$  and 12.'5mg'$  daily 135 tablet 3   Rivaroxaban  (XARELTO) 15 MG TABS tablet Take 1 tablet (15 mg total) by mouth daily with supper. 90 tablet 3   rosuvastatin (CRESTOR) 5 MG tablet Take 5 mg by mouth daily.     silodosin (RAPAFLO) 8 MG CAPS capsule Take 8 mg by mouth daily.     sulfamethoxazole-trimethoprim (BACTRIM DS) 800-160 MG tablet Take 1 tablet by mouth 2 (two) times daily.     alfuzosin (UROXATRAL) 10 MG 24 hr tablet Take 10 mg by mouth daily with breakfast. (Patient not taking: Reported on 04/04/2022)     propafenone (RYTHMOL) 150 MG tablet TAKE 1 TABLET (150 MG TOTAL) BY MOUTH EVERY 8 (EIGHT) HOURS. (Patient not taking: Reported on 11/03/2021) 270 tablet 3   No current facility-administered medications for this visit.     Review of Systems    He denies chest pain, palpitations, dyspnea, pnd, orthopnea, n, v, dizziness, syncope, edema, weight gain, or early satiety. All other systems reviewed and are otherwise negative except as noted above.   Physical Exam    VS:  BP 128/84   Pulse 60   Ht '5\' 11"'$  (1.803 m)   Wt 183 lb (83 kg)   SpO2 97%   BMI 25.52 kg/m   GEN: Well nourished, well developed, in no acute distress. HEENT: normal. Neck: Supple, no JVD, carotid bruits, or masses. Cardiac: IRIR, no murmurs, rubs, or gallops. No clubbing, cyanosis, edema.  Radials/DP/PT 2+ and equal bilaterally.  Respiratory:  Respirations regular and unlabored, clear to auscultation bilaterally. GI: Soft, nontender, nondistended, BS + x 4. MS: no deformity or atrophy. Skin: warm and dry, no rash. Neuro:  Strength and sensation are intact. Psych: Normal affect.  Accessory Clinical Findings    ECG personally reviewed by me today -atrial fibrillation, 60 bpm, LAFB- no acute changes.   Lab Results  Component Value Date   WBC 7.5 03/15/2022   HGB 14.9 03/15/2022   HCT 41.2 03/15/2022   MCV 88.4 03/15/2022   PLT 199 03/15/2022   Lab Results  Component Value Date   CREATININE 1.09 03/15/2022   BUN 22 03/15/2022   NA 135 03/15/2022    K 3.2 (L) 03/15/2022   CL 97 (L) 03/15/2022   CO2 32 03/15/2022   Lab Results  Component Value Date   ALT 18 03/15/2022   AST 21 03/15/2022   ALKPHOS 101 03/15/2022   BILITOT 1.6 (H) 03/15/2022   No results found for: "CHOL", "HDL", "LDLCALC", "LDLDIRECT", "TRIG", "CHOLHDL"  No results found for: "HGBA1C"  Assessment & Plan    1. Permanent atrial fibrillation: Rate controlled, asymptomatic. Continue metoprolol, Xarelto.   2. Hypertension: Chlorthalidone was decreased at previous office visit due to borderline hypotension, fatigue.  Patient apparently stopped taking it altogether.  He did note some intermittently elevated BP and as result restarted amlodipine 5 mg daily.  Okay to discontinue chlorthalidone and continue amlodipine. Otherwise, continue current antihypertensive regimen.     3. OSA: Adherent to CPAP.  Denies any  new concerns.   4. Disposition: Follow-up as scheduled with Dr. Claiborne Billings in 05/2022 (per patient request).     Lenna Sciara, NP 04/04/2022, 12:11 PM

## 2022-04-04 NOTE — Patient Instructions (Signed)
Medication Instructions:  Your physician recommends that you continue on your current medications as directed. Please refer to the Current Medication list given to you today.  *If you need a refill on your cardiac medications before your next appointment, please call your pharmacy*   Lab Work: NONE ordered at this time of appointment   If you have labs (blood work) drawn today and your tests are completely normal, you will receive your results only by: [place] (if you have MyChart) OR A paper copy in the mail If you have any lab test that is abnormal or we need to change your treatment, we will call you to review the results.   Testing/Procedures: NONE ordered at this time of appointment     Follow-Up: At [facility], you and your health needs are our priority.  As part of our continuing mission to provide you with exceptional heart care, we have created designated Provider Care Teams.  These Care Teams include your primary Cardiologist (physician) and Advanced Practice Providers (APPs -  Physician Assistants and Nurse Practitioners) who all work together to provide you with the care you need, when you need it.  We recommend signing up for the patient portal called "MyChart".  Sign up information is provided on this After Visit Summary.  MyChart is used to connect with patients for Virtual Visits (Telemedicine).  Patients are able to view lab/test results, encounter notes, upcoming appointments, etc.  Non-urgent messages can be sent to your provider as well.   To learn more about what you can do with MyChart, go to [email or web address].    Your next appointment:    Keep Follow up   The format for your next appointment:   In Person  Provider:   Shelva Majestic, MD     Other Instructions   Important Information About Sugar

## 2022-04-11 DIAGNOSIS — Z23 Encounter for immunization: Secondary | ICD-10-CM | POA: Diagnosis not present

## 2022-04-17 DIAGNOSIS — N410 Acute prostatitis: Secondary | ICD-10-CM | POA: Diagnosis not present

## 2022-04-17 DIAGNOSIS — I2510 Atherosclerotic heart disease of native coronary artery without angina pectoris: Secondary | ICD-10-CM | POA: Diagnosis not present

## 2022-04-18 DIAGNOSIS — Z7901 Long term (current) use of anticoagulants: Secondary | ICD-10-CM | POA: Diagnosis not present

## 2022-04-18 DIAGNOSIS — I480 Paroxysmal atrial fibrillation: Secondary | ICD-10-CM | POA: Diagnosis not present

## 2022-04-18 DIAGNOSIS — R7303 Prediabetes: Secondary | ICD-10-CM | POA: Diagnosis not present

## 2022-04-18 DIAGNOSIS — N401 Enlarged prostate with lower urinary tract symptoms: Secondary | ICD-10-CM | POA: Diagnosis not present

## 2022-04-18 DIAGNOSIS — E785 Hyperlipidemia, unspecified: Secondary | ICD-10-CM | POA: Diagnosis not present

## 2022-04-18 DIAGNOSIS — I10 Essential (primary) hypertension: Secondary | ICD-10-CM | POA: Diagnosis not present

## 2022-04-21 DIAGNOSIS — I10 Essential (primary) hypertension: Secondary | ICD-10-CM | POA: Diagnosis not present

## 2022-04-21 DIAGNOSIS — N410 Acute prostatitis: Secondary | ICD-10-CM | POA: Diagnosis not present

## 2022-04-21 DIAGNOSIS — N401 Enlarged prostate with lower urinary tract symptoms: Secondary | ICD-10-CM | POA: Diagnosis not present

## 2022-04-24 DIAGNOSIS — R8279 Other abnormal findings on microbiological examination of urine: Secondary | ICD-10-CM | POA: Diagnosis not present

## 2022-04-24 DIAGNOSIS — R3914 Feeling of incomplete bladder emptying: Secondary | ICD-10-CM | POA: Diagnosis not present

## 2022-04-24 DIAGNOSIS — N401 Enlarged prostate with lower urinary tract symptoms: Secondary | ICD-10-CM | POA: Diagnosis not present

## 2022-05-04 DIAGNOSIS — I480 Paroxysmal atrial fibrillation: Secondary | ICD-10-CM | POA: Diagnosis not present

## 2022-05-04 DIAGNOSIS — R7303 Prediabetes: Secondary | ICD-10-CM | POA: Diagnosis not present

## 2022-05-04 DIAGNOSIS — I10 Essential (primary) hypertension: Secondary | ICD-10-CM | POA: Diagnosis not present

## 2022-05-04 DIAGNOSIS — E785 Hyperlipidemia, unspecified: Secondary | ICD-10-CM | POA: Diagnosis not present

## 2022-05-08 ENCOUNTER — Encounter: Payer: Self-pay | Admitting: Cardiovascular Disease

## 2022-05-08 ENCOUNTER — Ambulatory Visit: Payer: Medicare Other | Attending: Cardiovascular Disease | Admitting: Cardiovascular Disease

## 2022-05-08 VITALS — BP 100/52 | HR 62 | Ht 71.0 in | Wt 189.0 lb

## 2022-05-08 DIAGNOSIS — I10 Essential (primary) hypertension: Secondary | ICD-10-CM | POA: Diagnosis not present

## 2022-05-08 DIAGNOSIS — Z5181 Encounter for therapeutic drug level monitoring: Secondary | ICD-10-CM | POA: Insufficient documentation

## 2022-05-08 DIAGNOSIS — I4821 Permanent atrial fibrillation: Secondary | ICD-10-CM | POA: Diagnosis not present

## 2022-05-08 DIAGNOSIS — Z7901 Long term (current) use of anticoagulants: Secondary | ICD-10-CM

## 2022-05-08 DIAGNOSIS — G4733 Obstructive sleep apnea (adult) (pediatric): Secondary | ICD-10-CM | POA: Diagnosis not present

## 2022-05-08 DIAGNOSIS — Z79899 Other long term (current) drug therapy: Secondary | ICD-10-CM | POA: Insufficient documentation

## 2022-05-08 MED ORDER — RIVAROXABAN 20 MG PO TABS: 20.0000 mg | ORAL_TABLET | Freq: Every day | ORAL | 3 refills | Status: DC

## 2022-05-08 NOTE — Progress Notes (Signed)
Cardiology Office Note    Date:  05/08/2022   ID:  [name], DOB [birth year], MRN [id number]  PCP:  Deland Pretty, MD  Cardiologist:  Shelva Majestic, MD   Follow-up office visit.  History of Present Illness:  Andrew [name] is a 88 y.o. male who has a history of atrial fibrillation on anticoagulation therapy, hypertension, obstructive sleep apnea on BiPAP therapy, and hereditary hemochromatosis.  I last evaluated him in July 2020 in a telemedicine visit.   My last evaluation, he has been evaluated by Florence Canner, NP on November 03, 2021 and most recently on April 04, 2022.  His medications have been adjusted apparently he is on amlodipine 5 mg, lisinopril 40 mg for hypertension.  He is on metoprolol succinate 25 mg alternating with 12.5 mg every other day and is no longer taking appar propafenone. He was recently started on rosuvastatin at just 5 mg and apparently remotely had been on Xarelto 20 mg but more recently is just on 15 mg.  Presently, he feels he is well.  He does admit to fatigability.  He is in need for dental surgery.  He continues to undergo phlebotomy for his hemochromatosis.  He continues to use BiPAP and a download was obtained from November 2 through May 05, 2022 which shows excellent compliance with average use at 8 hours and 39 minutes per night.  His BiPAP is set at 14/8.  There was occasional leak.  AHI is 4.7 the central index of 3.9.  He presents for follow-up Cardiologic evaluation.  Past Medical History:  Diagnosis Date   Adenomatous colon polyp 10/1993   Diverticulosis    Hemochromatosis    Compound Heterozygous for C282Y & H63D mutations   Hepatitis B antibody positive    Hypertension    OSA treated with BiPAP    PAF (paroxysmal atrial fibrillation) ([name])     Past Surgical History:  Procedure Laterality Date   CARDIOVERSION  08/18/2011   Procedure: CARDIOVERSION;  Surgeon: Troy Sine, MD;  Location: [place];  Service: Cardiovascular;   Laterality: N/A;   CARDIOVERSION  11/22/2011   Procedure: CARDIOVERSION;  Surgeon: Troy Sine, MD;  Location: [facility] OR;  Service: Cardiovascular;  Laterality: N/A;   COLONOSCOPY     KNEE ARTHROSCOPY     LAPAROSCOPIC CHOLECYSTECTOMY  04/2009   NM MYOCAR PERF WALL MOTION  05/08/2008   Normal   TONSILLECTOMY     US ECHOCARDIOGRAPHY  07/27/2011   Mild concentric LVH, LA & RA is moderately dilated, mild to mod MR.    Current Medications: Outpatient Medications Prior to Visit  Medication Sig Dispense Refill   amLODipine (NORVASC) 5 MG tablet Take 5 mg by mouth daily.     finasteride (PROSCAR) 5 MG tablet Take 5 mg by mouth daily.     HYDROcodone-acetaminophen (NORCO/VICODIN) 5-325 MG tablet Take 1 tablet by mouth every 4 (four) hours as needed.     lisinopril (PRINIVIL,ZESTRIL) 40 MG tablet Take 40 mg by mouth daily.     metoprolol succinate (TOPROL-XL) 25 MG 24 hr tablet Alternate taking 47m and 12.581mdaily 135 tablet 3   propafenone (RYTHMOL) 150 MG tablet TAKE 1 TABLET (150 MG TOTAL) BY MOUTH EVERY 8 (EIGHT) HOURS. 270 tablet 3   Rivaroxaban (XARELTO) 15 MG TABS tablet Take 1 tablet (15 mg total) by mouth daily with supper. 90 tablet 3   rosuvastatin (CRESTOR) 5 MG tablet Take 5 mg by mouth  daily.     silodosin (RAPAFLO) 8 MG CAPS capsule Take 8 mg by mouth daily.     alfuzosin (UROXATRAL) 10 MG 24 hr tablet Take 10 mg by mouth daily with breakfast.     sulfamethoxazole-trimethoprim (BACTRIM DS) 800-160 MG tablet Take 1 tablet by mouth 2 (two) times daily.     No facility-administered medications prior to visit.     Allergies:   Patient has no known allergies.   Social History   Socioeconomic History   Marital status: Married    Spouse name: Not on file   Number of children: Not on file   Years of education: Not on file   Highest education level: Not on file  Occupational History   Occupation: Retired    Fish farm manager: RETIRED  Tobacco Use   Smoking status: Former   Smokeless  tobacco: Never  Substance and Sexual Activity   Alcohol use: Yes    Alcohol/week: 0.0 standard drinks of alcohol    Comment: 2 glasses if wine daily   Drug use: No   Sexual activity: Not on file  Other Topics Concern   Not on file  Social History Narrative   Not on file   Social Determinants of Health   Financial Resource Strain: Not on file  Food Insecurity: Not on file  Transportation Needs: Not on file  Physical Activity: Not on file  Stress: Not on file  Social Connections: Not on file     Family History:  The patient's family history includes Emphysema in his mother; Esophageal cancer in his father; Stroke in his brother.   ROS General: Negative; No fevers, chills, or night sweats;  HEENT: Negative; No changes in vision or hearing, sinus congestion, difficulty swallowing Pulmonary: Negative; No cough, wheezing, shortness of breath, hemoptysis Cardiovascular: Negative; No chest pain, presyncope, syncope, palpitations GI: Negative; No nausea, vomiting, diarrhea, or abdominal pain GU: Negative; No dysuria, hematuria, or difficulty voiding Musculoskeletal: Negative; no myalgias, joint pain, or weakness Hematologic/Oncology: Negative; no easy bruising, bleeding Endocrine: Negative; no heat/cold intolerance; no diabetes Neuro: Negative; no changes in balance, headaches Skin: Negative; No rashes or skin lesions Psychiatric: Negative; No behavioral problems, depression Sleep: OSA on BiPAP therapy Other comprehensive 14 point system review is negative.   PHYSICAL EXAM:   VS:  BP (!) 100/52 (BP Location: Right Arm, Patient Position: Sitting, Cuff Size: Normal)   Pulse 62   Ht _0  (1.803 m)   Wt 189 lb (85.7 kg)   BMI 26.36 kg/m     Repeat  blood pressure by me 110/60  Wt Readings from Last 3 Encounters:  05/08/22 189 lb (85.7 kg)  04/04/22 183 lb (83 kg)  03/15/22 186 lb 1.6 oz (84.4 kg)    General: Alert, oriented, no distress.  There appears to be some mild  pursed lipped breathing Skin: normal turgor, no rashes, warm and dry HEENT: Normocephalic, atraumatic. Pupils equal round and reactive to light; sclera anicteric; extraocular muscles intact;  Nose without nasal septal hypertrophy Mouth/Parynx benign; Mallinpatti scale 3 Neck: No JVD, no carotid bruits; normal carotid upstroke Lungs: clear to ausculatation and percussion; no wheezing or rales Chest wall: without tenderness to palpitation Heart: PMI not displaced, really irregular with rate control, s1 s2 normal, 1/6 systolic murmur, no diastolic murmur, no rubs, gallops, thrills, or heaves Abdomen: soft, nontender; no hepatosplenomehaly, BS+; abdominal aorta nontender and not dilated by palpation. Back: no CVA tenderness Pulses 2+ Musculoskeletal: full range of motion, normal strength, no joint deformities  Extremities: no clubbing cyanosis or edema, Homan's sign negative  Neurologic: grossly nonfocal; Cranial nerves grossly wnl Psychologic: Normal mood and affect   Studies/Labs Reviewed:   May 08, 2022 ECG (independently read by me): Atrial fibrillation at 62, LAD, LBBB  Recent Labs:    Latest Ref Rng & Units 03/15/2022    8:48 AM 11/23/2021    8:49 AM 08/23/2021    3:07 PM  BMP  Glucose 70 - 99 mg/dL 139  144  125   BUN 8 - 23 mg/dL _0 Creatinine 0.61 - 1.24 mg/dL 1.09  1.04  0.94   Sodium 135 - 145 mmol/L 135  134  137   Potassium 3.5 - 5.1 mmol/L 3.2  3.4  3.6   Chloride 98 - 111 mmol/L 97  97  100   CO2 22 - 32 mmol/L 32  31  32   Calcium 8.9 - 10.3 mg/dL 9.4  9.9  9.7         Latest Ref Rng & Units 03/15/2022    8:48 AM 11/23/2021    8:49 AM 08/23/2021    3:07 PM  Hepatic Function  Total Protein 6.5 - 8.1 g/dL 7.6  7.6  7.4   Albumin 3.5 - 5.0 g/dL 3.9  4.0  3.8   AST 15 - 41 U/L _1 ALT 0 - 44 U/L _2 Alk Phosphatase 38 - 126 U/L 101  77  88   Total Bilirubin 0.3 - 1.2 mg/dL 1.6  1.2  1.0        Latest Ref Rng & Units 03/15/2022     8:48 AM 11/23/2021    8:49 AM 08/23/2021    3:07 PM  CBC  WBC 4.0 - 10.5 K/uL 7.5  7.3  7.4   Hemoglobin 13.0 - 17.0 g/dL 14.9  15.2  14.4   Hematocrit 39.0 - 52.0 % 41.2  42.0  40.5   Platelets 150 - 400 K/uL 199  165  162    Lab Results  Component Value Date   MCV 88.4 03/15/2022   MCV 88.8 11/23/2021   MCV 88.8 08/23/2021   No results found for: "TSH" No results found for: "HGBA1C"   BNP No results found for: "BNP"  ProBNP No results found for: "PROBNP"   Lipid Panel  No results found for: "CHOL", "TRIG", "HDL", "CHOLHDL", "VLDL", "LDLCALC", "LDLDIRECT", "LABVLDL"   RADIOLOGY: No results found.   Additional studies/ records that were reviewed today include:  I reviewed his records since my last telemedicine evaluation with him from July 2020   ASSESSMENT:    No diagnosis found.   PLAN:  Matas Burrows is an 88 year old gentleman is a history of fibrillation which commenced in February 2013.  He had undergone several cardioversions and had been maintaining sinus rhythm.  However he is now in permanent atrial fibrillation and no longer on previous antiarrhythmic therapy with propafenone.  Apparently, he has reduced his Xarelto and apparently is only on 15 mg.  Although he is 88 years old, he has normal renal function laboratory in October 2023 showed a creatinine of 1.09.  Consequently, he should be on Xarelto 20 mg daily.  At that time potassium was low.  He had been on chlorthalidone 25 mg and this was reduced to 12.5 mg and when subsequently seen by Florence Canner amlodipine was added for improved blood pressure control.  Certainly, I am  recommending we recheck a chemistry evaluation.  If creatinine clearance is greater than 50 he will resume Xarelto at 20 mg particularly since he is in permanent atrial fibrillation.  On exam today he did have some pursed lipped breathing.  I am recommending a follow-up echo Doppler study for reassessment of LV systolic and diastolic  function and valvular architecture.  He is now on low-dose rosuvastatin 5 mg and has previously documented elevated calcium score of 720 calcification predominantly in the LAD at 425, RCA 207, and circumflex at 87.7 from a CT scan done on March 01, 2022 by Dr. Deland Pretty.  He will need subsequent follow-up fasting laboratories for reassessment of lipid status will be seeing Dr. Deland Pretty for primary care.  Continues to use BiPAP for his obstructive sleep apnea and is compliant on download.  He believes he is sleeping well.  He undergoes phlebotomy by Dr. Irene Limbo and Dr. Fuller Plan for his hereditary hemochromatosis.  I will see him in 6 months for follow-up evaluation or sooner as needed.   Medication Adjustments/Labs and Tests Ordered: Current medicines are reviewed at length with the patient today.  Concerns regarding medicines are outlined above.  Medication changes, Labs and Tests ordered today are listed in the Patient Instructions below. There are no Patient Instructions on file for this visit.   Signed, Shelva Majestic, MD  05/08/2022 3:28 PM    [facility] [address] [place], [place], [place]  [address] Phone: [phone]

## 2022-05-08 NOTE — Patient Instructions (Addendum)
Medication Instructions:  INCREASE Xarelto to 20 mg daily   *If you need a refill on your cardiac medications before your next appointment, please call your pharmacy*   Lab Work: Your physician recommends that you return for lab work TODAY:  CMP   If you have labs (blood work) drawn today and your tests are completely normal, you will receive your results only by: [place] (if you have MyChart) OR A paper copy in the mail If you have any lab test that is abnormal or we need to change your treatment, we will call you to review the results.  Testing/Procedures: Your physician has requested that you have an echocardiogram. Echocardiography is a painless test that uses sound waves to create images of your heart. It provides your doctor with information about the size and shape of your heart and how well your heart's chambers and valves are working. This procedure takes approximately one hour. There are no restrictions for this procedure. Please do NOT wear cologne, perfume, aftershave, or lotions (deodorant is allowed). Please arrive 15 minutes prior to your appointment time.  Follow-Up: At [facility], you and your health needs are our priority.  As part of our continuing mission to provide you with exceptional heart care, we have created designated Provider Care Teams.  These Care Teams include your primary Cardiologist (physician) and Advanced Practice Providers (APPs -  Physician Assistants and Nurse Practitioners) who all work together to provide you with the care you need, when you need it.  Your next appointment:   6 month(s)  The format for your next appointment:   In Person  Provider:   Shelva Majestic, MD     Other Instructions   Important Information About Sugar

## 2022-05-09 ENCOUNTER — Telehealth: Payer: Self-pay

## 2022-05-09 LAB — COMPREHENSIVE METABOLIC PANEL
ALT: 29 IU/L (ref 0–44)
AST: 38 IU/L (ref 0–40)
Albumin/Globulin Ratio: 1.3 (ref 1.2–2.2)
Albumin: 4 g/dL (ref 3.7–4.7)
Alkaline Phosphatase: 104 IU/L (ref 44–121)
BUN/Creatinine Ratio: 20 (ref 10–24)
BUN: 21 mg/dL (ref 8–27)
Bilirubin Total: 0.9 mg/dL (ref 0.0–1.2)
CO2: 28 mmol/L (ref 20–29)
Calcium: 9.9 mg/dL (ref 8.6–10.2)
Chloride: 95 mmol/L — ABNORMAL LOW (ref 96–106)
Creatinine, Ser: 1.06 mg/dL (ref 0.76–1.27)
Globulin, Total: 3.1 g/dL (ref 1.5–4.5)
Glucose: 103 mg/dL — ABNORMAL HIGH (ref 70–99)
Potassium: 3.6 mmol/L (ref 3.5–5.2)
Sodium: 139 mmol/L (ref 134–144)
Total Protein: 7.1 g/dL (ref 6.0–8.5)
eGFR: 67 mL/min/{1.73_m2} (ref >59)

## 2022-05-09 NOTE — Telephone Encounter (Signed)
Called patient left message on personal voice mail Dr.Kelly reviewed cmet from yesterday 12/4.He advised take Xarelto 20 mg daily with supper.Prescription was sent to your pharmacy yesterday.

## 2022-05-11 DIAGNOSIS — I10 Essential (primary) hypertension: Secondary | ICD-10-CM | POA: Diagnosis not present

## 2022-05-15 ENCOUNTER — Encounter: Payer: Self-pay | Admitting: Cardiovascular Disease

## 2022-06-01 ENCOUNTER — Ambulatory Visit (HOSPITAL_COMMUNITY): Payer: Medicare Other | Attending: Cardiology

## 2022-06-01 DIAGNOSIS — I4821 Permanent atrial fibrillation: Secondary | ICD-10-CM

## 2022-06-01 LAB — ECHOCARDIOGRAM COMPLETE
MV M vel: 4.86 m/s
MV Peak grad: 94.5 mmHg
S' Lateral: 2.9 cm

## 2022-07-05 ENCOUNTER — Other Ambulatory Visit: Payer: Medicare Other

## 2022-07-10 ENCOUNTER — Ambulatory Visit (INDEPENDENT_AMBULATORY_CARE_PROVIDER_SITE_OTHER)
Admission: RE | Admit: 2022-07-10 | Discharge: 2022-07-10 | Disposition: A | Discharge status: Home / Self Care | Payer: Medicare Other | Source: Ambulatory Visit | Attending: Family Medicine | Admitting: Family Medicine

## 2022-07-10 ENCOUNTER — Ambulatory Visit (INDEPENDENT_AMBULATORY_CARE_PROVIDER_SITE_OTHER): Payer: Medicare Other | Admitting: Family Medicine

## 2022-07-10 ENCOUNTER — Ambulatory Visit (INDEPENDENT_AMBULATORY_CARE_PROVIDER_SITE_OTHER): Payer: Medicare Other

## 2022-07-10 ENCOUNTER — Encounter: Payer: Self-pay | Admitting: Hematology

## 2022-07-10 ENCOUNTER — Encounter: Payer: Self-pay | Admitting: Family Medicine

## 2022-07-10 VITALS — BP 132/82 | HR 79 | Ht 71.0 in | Wt 187.0 lb

## 2022-07-10 DIAGNOSIS — M25572 Pain in left ankle and joints of left foot: Secondary | ICD-10-CM | POA: Diagnosis not present

## 2022-07-10 LAB — URIC ACID: Uric Acid, Serum: 6.6 mg/dL (ref 4.0–7.8)

## 2022-07-10 MED ORDER — COLCHICINE 0.6 MG PO TABS: 0.6000 mg | ORAL_TABLET | Freq: Every day | ORAL | 2 refills | Status: AC | PRN

## 2022-07-10 NOTE — Progress Notes (Unsigned)
   I, Josepha Pigg, CMA acting as a scribe for Lynne Leader, MD.  Andrew [name] is a 89 y.o. male who presents to [place] at [facility] today for L ankle pain. Pt was previously seen by Dr. Georgina Snell on 10/03/21 for L shoulder pain. Today, pt c/o L ankle pain.  L ankle swelling: LEFT ankle injury this past Saturday. Got up to use the bathroom in the middle of the night and felt pain with WB. Pain at top of the foot. Some swelling present, denies bruising.  Aggravates: Sx worse with WB, constant dull pain. Pain with plantarflexion.  Treatments tried: Tylenol and heat with short-term relief.    Pertinent review of systems: No fevers or chills  Relevant historical information: Hypertension.  A-fib. No known history of gout.   Exam:  BP 132/82   Pulse 79   Ht '5\' 11"'$  (1.803 m)   Wt 187 lb (84.8 kg)   SpO2 98%   BMI 26.08 kg/m  General: Well Developed, well nourished, and in no acute distress.   MSK: Left ankle: Mild swelling. Tender palpation anterior ankle. Decreased range of motion. Intact strength some pain resisted foot dorsiflexion.    Lab and Radiology Results  Diagnostic Limited MSK Ultrasound of: Left ankle Moderate joint effusion present at anterior ankle and at lateral ankle joint. Anterior ankle tendon structures are intact appearing. Impression: Joint effusion  X-ray images left ankle obtained today personally and independently interpreted Ankle DJD is present with soft tissue calcifications at the lateral joint line and at the posterior and plantar calcaneus. Await formal radiology review  Assessment and Plan: 89 y.o. male with left ankle pain thought to be due to ankle effusion.  Pain started suddenly without an 3.  This is concerning for gout.  Check uric acid and start colchicine.  If not improved in the next few days elevated we can try a steroid injection.   PDMP not reviewed this encounter. Orders Placed This Encounter  Procedures    [place] LIMITED JOINT SPACE STRUCTURES LOW LEFT    Order Specific Question:   Reason for Exam (SYMPTOM  OR DIAGNOSIS REQUIRED)    Answer:   left ankle pain    Order Specific Question:   Preferred imaging location?    Answer:   [facility]   DG Ankle Complete Left    Standing Status:   Future    Number of Occurrences:   1    Standing Expiration Date:   07/11/2023    Order Specific Question:   Reason for Exam (SYMPTOM  OR DIAGNOSIS REQUIRED)    Answer:   eval left ankle    Order Specific Question:   Preferred imaging location?    Answer:   [facility]   Uric acid    Standing Status:   Future    Number of Occurrences:   1    Standing Expiration Date:   07/11/2023   Meds ordered this encounter  Medications   colchicine 0.6 MG tablet    Sig: Take 1 tablet (0.6 mg total) by mouth daily as needed (gout or psuedogout pain).    Dispense:  30 tablet    Refill:  2     Discussed warning signs or symptoms. Please see discharge instructions. Patient expresses understanding.   The above documentation has been reviewed and is accurate and complete Lynne Leader, M.D.

## 2022-07-10 NOTE — Patient Instructions (Signed)
Thank you for coming in today.   Please get an Xray today before you leave   Please get labs today before you leave   Try colchicine daily for gout as needed.   If this is not working let me know and I will react quickly and move to an injection before the end of the week.

## 2022-07-11 ENCOUNTER — Encounter: Payer: Self-pay | Admitting: Hematology

## 2022-07-11 NOTE — Progress Notes (Signed)
Uric acid is a little bit elevated.  It is not quite high enough for me to think that you gout is definitely the problem.  Please let me know how you feel after a few days of colchicine.  If not getting better lets set up an appointment for Friday or sooner to do an injection.

## 2022-07-11 NOTE — Progress Notes (Signed)
Left ankle x-ray shows a little bit of ankle swelling but no fractures.

## 2022-08-21 ENCOUNTER — Ambulatory Visit (HOSPITAL_COMMUNITY)
Admission: RE | Admit: 2022-08-21 | Discharge: 2022-08-21 | Disposition: A | Discharge status: Home / Self Care | Payer: Medicare Other | Source: Ambulatory Visit | Attending: Hematology | Admitting: Hematology

## 2022-08-21 DIAGNOSIS — K760 Fatty (change of) liver, not elsewhere classified: Secondary | ICD-10-CM | POA: Diagnosis not present

## 2022-09-11 ENCOUNTER — Other Ambulatory Visit: Payer: Self-pay

## 2022-09-11 ENCOUNTER — Ambulatory Visit (INDEPENDENT_AMBULATORY_CARE_PROVIDER_SITE_OTHER): Payer: Medicare Other

## 2022-09-11 ENCOUNTER — Ambulatory Visit (INDEPENDENT_AMBULATORY_CARE_PROVIDER_SITE_OTHER): Payer: Medicare Other | Admitting: Family Medicine

## 2022-09-11 ENCOUNTER — Encounter: Payer: Self-pay | Admitting: Family Medicine

## 2022-09-11 VITALS — BP 138/78 | HR 70 | Ht 71.0 in | Wt 186.8 lb

## 2022-09-11 DIAGNOSIS — M25561 Pain in right knee: Secondary | ICD-10-CM

## 2022-09-11 DIAGNOSIS — G8929 Other chronic pain: Secondary | ICD-10-CM

## 2022-09-11 DIAGNOSIS — M25562 Pain in left knee: Secondary | ICD-10-CM | POA: Diagnosis not present

## 2022-09-11 DIAGNOSIS — M11261 Other chondrocalcinosis, right knee: Secondary | ICD-10-CM | POA: Diagnosis not present

## 2022-09-11 DIAGNOSIS — M1711 Unilateral primary osteoarthritis, right knee: Secondary | ICD-10-CM | POA: Diagnosis not present

## 2022-09-11 NOTE — Progress Notes (Unsigned)
   I, Stevenson Clinch, CMA acting as a scribe for [name], MD.  Belinda Block is a 89 y.o. male who presents to [facility] today for right knee pain.  Patient was previously seen by Dr. Denyse Amass on 07/10/2022 for left ankle pain.  Today, pt c/o R knee pain x 3 days, MOI unknown. Pt locates pain to medial aspect of the knee, entire knee joint.   R Knee swelling: yes Mechanical symptoms: no Aggravates: WB, ambulation, extension Treatments tried: Tylenol, heat  Pertinent review of systems: ***  Relevant historical information: ***   Exam:  There were no vitals taken for this visit. General: Well Developed, well nourished, and in no acute distress.   MSK: ***    Lab and Radiology Results No results found for this or any previous visit (from the past 72 hour(s)). No results found.     Assessment and Plan: 89 y.o. male with ***   PDMP not reviewed this encounter. No orders of the defined types were placed in this encounter.  No orders of the defined types were placed in this encounter.    Discussed warning signs or symptoms. Please see discharge instructions. Patient expresses understanding.   ***

## 2022-09-11 NOTE — Patient Instructions (Addendum)
Thank you for coming in today.   Please get an Xray today before you leave   Please use Voltaren gel (Generic Diclofenac Gel) up to 4x daily for pain as needed.  This is available over-the-counter as both the name brand Voltaren gel and the generic diclofenac gel.   You received an injection today. Seek immediate medical attention if the joint becomes red, extremely painful, or is oozing fluid.   We will culture the fluid from your knee.   Check back in 1 month

## 2022-09-12 ENCOUNTER — Other Ambulatory Visit: Payer: Self-pay

## 2022-09-12 ENCOUNTER — Ambulatory Visit: Payer: Medicare Other | Admitting: Family Medicine

## 2022-09-12 LAB — SYNOVIAL FLUID ANALYSIS, COMPLETE
Lymphocytes-Synovial Fld: 3 % (ref 0–74)
Monocyte/Macrophage: 14 % (ref 0–69)
Neutrophil, Synovial: 83 % — ABNORMAL HIGH (ref 0–24)

## 2022-09-12 NOTE — Progress Notes (Signed)
Right knee x-ray shows possible pseudogout.  Will see this on the lab test if present truly.  You do have some arthritis changes in the knee as well.

## 2022-09-12 NOTE — Progress Notes (Signed)
Left knee x-ray shows some arthritis changes.

## 2022-09-13 ENCOUNTER — Inpatient Hospital Stay: Payer: Medicare Other | Attending: Hematology

## 2022-09-13 ENCOUNTER — Inpatient Hospital Stay: Payer: Medicare Other

## 2022-09-13 ENCOUNTER — Inpatient Hospital Stay (HOSPITAL_BASED_OUTPATIENT_CLINIC_OR_DEPARTMENT_OTHER): Payer: Medicare Other | Admitting: Hematology

## 2022-09-13 ENCOUNTER — Other Ambulatory Visit: Payer: Self-pay

## 2022-09-13 DIAGNOSIS — K769 Liver disease, unspecified: Secondary | ICD-10-CM | POA: Diagnosis not present

## 2022-09-13 LAB — CMP (CANCER CENTER ONLY)
ALT: 23 U/L (ref 0–44)
AST: 25 U/L (ref 15–41)
Albumin: 4.1 g/dL (ref 3.5–5.0)
Alkaline Phosphatase: 104 U/L (ref 38–126)
Anion gap: 8 (ref 5–15)
BUN: 27 mg/dL — ABNORMAL HIGH (ref 8–23)
CO2: 29 mmol/L (ref 22–32)
Calcium: 10.3 mg/dL (ref 8.9–10.3)
Chloride: 93 mmol/L — ABNORMAL LOW (ref 98–111)
Creatinine: 1.01 mg/dL (ref 0.61–1.24)
GFR, Estimated: >60 mL/min (ref >60)
Glucose, Bld: 173 mg/dL — ABNORMAL HIGH (ref 70–99)
Potassium: 3.3 mmol/L — ABNORMAL LOW (ref 3.5–5.1)
Sodium: 130 mmol/L — ABNORMAL LOW (ref 135–145)
Total Bilirubin: 0.9 mg/dL (ref 0.3–1.2)
Total Protein: 8 g/dL (ref 6.5–8.1)

## 2022-09-13 LAB — CBC WITH DIFFERENTIAL (CANCER CENTER ONLY)
Abs Immature Granulocytes: 0.14 10*3/uL — ABNORMAL HIGH (ref 0.00–0.07)
Basophils Absolute: 0 10*3/uL (ref 0.0–0.1)
Basophils Relative: 0 %
Eosinophils Absolute: 0 10*3/uL (ref 0.0–0.5)
Eosinophils Relative: 0 %
HCT: 39.9 % (ref 39.0–52.0)
Hemoglobin: 14.7 g/dL (ref 13.0–17.0)
Immature Granulocytes: 1 %
Lymphocytes Relative: 5 %
Lymphs Abs: 0.7 10*3/uL (ref 0.7–4.0)
MCH: 32.1 pg (ref 26.0–34.0)
MCHC: 36.8 g/dL — ABNORMAL HIGH (ref 30.0–36.0)
MCV: 87.1 fL (ref 80.0–100.0)
Monocytes Absolute: 0.8 10*3/uL (ref 0.1–1.0)
Monocytes Relative: 6 %
Neutro Abs: 13.1 10*3/uL — ABNORMAL HIGH (ref 1.7–7.7)
Neutrophils Relative %: 88 %
Platelet Count: 213 10*3/uL (ref 150–400)
RBC: 4.58 MIL/uL (ref 4.22–5.81)
RDW: 12.3 % (ref 11.5–15.5)
WBC Count: 14.8 10*3/uL — ABNORMAL HIGH (ref 4.0–10.5)
nRBC: 0 % (ref 0.0–0.2)

## 2022-09-13 LAB — FERRITIN: Ferritin: 158 ng/mL (ref 24–336)

## 2022-09-13 LAB — IRON AND IRON BINDING CAPACITY (CC-WL,HP ONLY)
Iron: 127 ug/dL (ref 45–182)
Saturation Ratios: 48 % — ABNORMAL HIGH (ref 17.9–39.5)
TIBC: 266 ug/dL (ref 250–450)
UIBC: 139 ug/dL (ref 117–376)

## 2022-09-13 NOTE — Progress Notes (Signed)
HEMATOLOGY/ONCOLOGY CLINIC NOTE  Date of Service: 09/13/22    Patient Care Team: Merri Brunette, MD as PCP - General (Internal Medicine) Lennette Bihari, MD as PCP - Cardiology (Cardiology)  CHIEF COMPLAINTS/PURPOSE OF CONSULTATION:  Transfer of care for Hereditary hemochromatosis for continued management.  HISTORY OF PRESENTING ILLNESS:  Andrew [name] is a wonderful 89 y.o. male who has been referred to [place] by Dr Claudette Head MD for evaluation and management of hereditary hemochromatosis.   Patient has a history of hypertension, paroxysmal atrial fibrillation on reduced dose Xarelto at 15 mg daily, obstructive sleep apnea treated with BiPAP, hepatitis B antibody positive who apparently has a history of hereditary hemochromatosis and has been following with Dr. Russella Dar for therapeutic phlebotomies every 3 months for nearly 10 years. He was having his therapeutic phlebotomies as [facility] but notes that they have no longer do these and he was [facility]   Patient notes no significant issues with tolerating his therapeutic phlebotomies. Denies having significant alcohol use, iron supplementation or excessive red meat intake.   His last outside labs from 04/06/2021 showed normal CBC with a hemoglobin of 15.4 hematocrit of 43.9, WBC count of 6.7k and platelets of 177k BMP was unremarkable Ferritin was 74.6 with an iron saturation of 64.4. Patient notes his last therapeutic phlebotomy was about 3 to 4 months ago.   Patient denies any history of liver cirrhosis. He did have a liver biopsy on 06/23/2009 which showed chronic active hepatitis with increased iron.   INTERVAL HISTORY:  Andrew [name] is here for continued evaluation and management of his hereditary hemochromatosis.  He was last seen by me on 03/15/22 and was doing well with no new symptoms.  Today, he states that he has been doing well from a hematology  perspective.  He received a cortisone injection in his right knee x2 days ago after gout diagnosis- his knee pain has completely resolved. He reports having gout in his great toe in the past.  He denies any back or abdominal pain.  MEDICAL HISTORY:  Past Medical History:  Diagnosis Date   Adenomatous colon polyp 10/1993   Diverticulosis    Hemochromatosis    Compound Heterozygous for C282Y & H63D mutations   Hepatitis B antibody positive    Hypertension    OSA treated with BiPAP    PAF (paroxysmal atrial fibrillation)     SURGICAL HISTORY: Past Surgical History:  Procedure Laterality Date   CARDIOVERSION  08/18/2011   Procedure: CARDIOVERSION;  Surgeon: Lennette Bihari, MD;  Location: [facility] OR;  Service: Cardiovascular;  Laterality: N/A;   CARDIOVERSION  11/22/2011   Procedure: CARDIOVERSION;  Surgeon: Lennette Bihari, MD;  Location: [facility] OR;  Service: Cardiovascular;  Laterality: N/A;   COLONOSCOPY     KNEE ARTHROSCOPY     LAPAROSCOPIC CHOLECYSTECTOMY  04/2009   NM MYOCAR PERF WALL MOTION  05/08/2008   Normal   TONSILLECTOMY     US ECHOCARDIOGRAPHY  07/27/2011   Mild concentric LVH, LA & RA is moderately dilated, mild to mod MR.    SOCIAL HISTORY: Social History   Socioeconomic History   Marital status: Married    Spouse name: Not on file   Number of children: Not on file   Years of education: Not on file   Highest education level: Not on file  Occupational History   Occupation: Retired    Associate Professor: RETIRED  Tobacco Use  Smoking status: Former   Smokeless tobacco: Never  Substance and Sexual Activity   Alcohol use: Yes    Alcohol/week: 0.0 standard drinks of alcohol    Comment: 2 glasses if wine daily   Drug use: No   Sexual activity: Not on file  Other Topics Concern   Not on file  Social History Narrative   Not on file   Social Determinants of Health   Financial Resource Strain: Not on file  Food Insecurity: Not on file  Transportation Needs: Not on file   Physical Activity: Not on file  Stress: Not on file  Social Connections: Not on file  Intimate Partner Violence: Not on file    FAMILY HISTORY: Family History  Problem Relation Age of Onset   Esophageal cancer Father    Emphysema Mother    Stroke Brother     ALLERGIES:  has No Known Allergies.  MEDICATIONS:  Current Outpatient Medications  Medication Sig Dispense Refill   amLODipine (NORVASC) 5 MG tablet Take 5 mg by mouth daily.     colchicine 0.6 MG tablet Take 1 tablet (0.6 mg total) by mouth daily as needed (gout or psuedogout pain). 30 tablet 2   finasteride (PROSCAR) 5 MG tablet Take 5 mg by mouth daily.     lisinopril (PRINIVIL,ZESTRIL) 40 MG tablet Take 40 mg by mouth daily.     metoprolol succinate (TOPROL-XL) 25 MG 24 hr tablet Alternate taking 25mg  and 12.5mg  daily 135 tablet 3   rivaroxaban (XARELTO) 20 MG TABS tablet Take 1 tablet (20 mg total) by mouth daily with supper. 90 tablet 3   rosuvastatin (CRESTOR) 5 MG tablet Take 5 mg by mouth daily.     silodosin (RAPAFLO) 8 MG CAPS capsule Take 8 mg by mouth daily.     No current facility-administered medications for this visit.    REVIEW OF SYSTEMS:   10 Point review of Systems was done is negative except as noted above.   PHYSICAL EXAMINATION:  Vitals:   09/13/22 1034  BP: 133/81  Pulse: 76  Resp: 18  Temp: (!) 97.5 F (36.4 C)  SpO2: 98%    Filed Weights   09/13/22 1034  Weight: 189 lb (85.7 kg)    Body mass index is 26.36 kg/m.  GENERAL:alert, in no acute distress and comfortable SKIN: no acute rashes, no significant lesions EYES: conjunctiva are pink and non-injected, sclera anicteric OROPHARYNX: MMM, no exudates, no oropharyngeal erythema or ulceration NECK: supple, no JVD LYMPH:  no palpable lymphadenopathy in the cervical, axillary or inguinal regions LUNGS: clear to auscultation b/l with normal respiratory effort HEART: regular rate & rhythm ABDOMEN:  normoactive bowel sounds , non  tender, not distended. Extremity: no pedal edema PSYCH: alert & oriented x 3 with fluent speech NEURO: no focal motor/sensory deficits  LABORATORY DATA:  I have reviewed the data as listed     Latest Ref Rng & Units 09/13/2022   10:22 AM 03/15/2022    8:48 AM 11/23/2021    8:49 AM  CBC  WBC 4.0 - 10.5 K/uL 14.8  7.5  7.3   Hemoglobin 13.0 - 17.0 g/dL 19.1  47.8  29.5   Hematocrit 39.0 - 52.0 % 39.9  41.2  42.0   Platelets 150 - 400 K/uL 213  199  165       Latest Ref Rng & Units 09/13/2022   10:22 AM 05/08/2022    4:01 PM 03/15/2022    8:48 AM  CMP  Glucose 70 -  99 mg/dL 161  096  045   BUN 8 - 23 mg/dL 27  21  22    Creatinine 0.61 - 1.24 mg/dL 4.09  8.11  9.14   Sodium 135 - 145 mmol/L 130  139  135   Potassium 3.5 - 5.1 mmol/L 3.3  3.6  3.2   Chloride 98 - 111 mmol/L 93  95  97   CO2 22 - 32 mmol/L 29  28  32   Calcium 8.9 - 10.3 mg/dL 78.2  9.9  9.4   Total Protein 6.5 - 8.1 g/dL 8.0  7.1  7.6   Total Bilirubin 0.3 - 1.2 mg/dL 0.9  0.9  1.6   Alkaline Phos 38 - 126 U/L 104  104  101   AST 15 - 41 U/L 25  38  21   ALT 0 - 44 U/L 23  29  18     Lab Results  Component Value Date   IRON 127 09/13/2022   TIBC 266 09/13/2022   IRONPCTSAT 48 (H) 09/13/2022   Lab Results  Component Value Date   FERRITIN 158 09/13/2022    RADIOGRAPHIC STUDIES: I have personally reviewed the radiological images as listed and agreed with the findings in the report. DG Knee AP/LAT W/Sunrise Left  Result Date: 09/11/2022 CLINICAL DATA:  Bilateral knee pain EXAM: LEFT KNEE 3 VIEWS COMPARISON:  None Available. FINDINGS: Chondrocalcinosis. Partial but significant loss of joint space medially. Patellofemoral degenerative changes with moderate osteophytes. No joint effusion. Probable loose bodies identified posteriorly on the lateral view. No other abnormalities. IMPRESSION: 1. Chondrocalcinosis. 2. Partial but significant loss of joint space medially. 3. Patellofemoral degenerative changes. 4. Loose  bodies posteriorly on the lateral view. Electronically Signed   By: Gerome Sam III M.D.   On: 09/11/2022 17:46   DG Knee AP/LAT W/Sunrise Right  Result Date: 09/11/2022 CLINICAL DATA:  Pain EXAM: RIGHT KNEE 3 VIEWS COMPARISON:  None Available. FINDINGS: Chondrocalcinosis. Patellofemoral degenerative change, mild. Probable small joint effusion. Mild loss of joint space in the medial compartment. No other significant abnormalities. IMPRESSION: 1. Chondrocalcinosis consistent with CPPD. 2. Mild patellofemoral degenerative change. 3. Mild loss of joint space in the medial compartment. 4. Probable small joint effusion. Electronically Signed   By: Gerome Sam III M.D.   On: 09/11/2022 17:44   US ABDOMEN RUQ W/ELASTOGRAPHY  Result Date: 08/22/2022 CLINICAL DATA:  Hereditary hemochromatosis. EXAM: US ABDOMEN LIMITED - RIGHT UPPER QUADRANT ULTRASOUND HEPATIC ELASTOGRAPHY TECHNIQUE: Sonography of the right upper quadrant was performed. In addition, ultrasound elastography evaluation of the liver was performed. A region of interest was placed within the right lobe of the liver. Following application of a compressive sonographic pulse, tissue compressibility was assessed. Multiple assessments were performed at the selected site. Median tissue compressibility was determined. Previously, hepatic stiffness was assessed by shear wave velocity. Based on recently published Society of Radiologists in Ultrasound consensus article, reporting is now recommended to be performed in the SI units of pressure (kiloPascals) representing hepatic stiffness/elasticity. The obtained result is compared to the published reference standards. (cACLD = compensated Advanced Chronic Liver Disease) COMPARISON:  03/29/2009 ultrasound FINDINGS: ULTRASOUND ABDOMEN LIMITED RIGHT UPPER QUADRANT Gallbladder: Previous cholecystectomy by history Common bile duct: Diameter: 3 mm Liver: Diffusely echogenic hepatic parenchyma consistent with fatty  liver infiltration. There are some small cystic areas identified such as left hepatic lobe measuring 1.3 x 1.6 x 2.2 cm in right hepatic lobe 2.6 x 1.3 x 1.9 cm. Larger focus has a menstrual  echoes and is not clearly simple. Portal vein is patent on color Doppler imaging with normal direction of blood flow towards the liver. ULTRASOUND HEPATIC ELASTOGRAPHY Device: Siemens Helix VTQ Patient position: Oblique Transducer 9C2 Number of measurements: 12 Hepatic segment:  8 Median kPa: 5.4 IQR: 2.1 IQR/Median kPa ratio: 0.4 Data quality: IQR/Median kPa ratio of 0.3 or greater indicates reduced accuracy Diagnostic category: < or = 9 kPa: in the absence of other known clinical signs, rules out cACLD The use of hepatic elastography is applicable to patients with viral hepatitis and non-alcoholic fatty liver disease. At this time, there is insufficient data for the referenced cut-off values and use in other causes of liver disease, including alcoholic liver disease. Patients, however, may be assessed by elastography and serve as their own reference standard/baseline. In patients with non-alcoholic liver disease, the values suggesting compensated advanced chronic liver disease (cACLD) may be lower, and patients may need additional testing with elasticity results of 7-9 kPa. Please note that abnormal hepatic elasticity and shear wave velocities may also be identified in clinical settings other than with hepatic fibrosis, such as: acute hepatitis, elevated right heart and central venous pressures including use of beta blockers, veno-occlusive disease (Budd-Chiari), infiltrative processes such as mastocytosis/amyloidosis/infiltrative tumor/lymphoma, extrahepatic cholestasis, with hyperemia in the post-prandial state, and with liver transplantation. Correlation with patient history, laboratory data, and clinical condition recommended. Diagnostic Categories: < or =5 kPa: high probability of being normal < or =9 kPa: in the absence  of other known clinical signs, rules out cACLD >9 kPa and ?13 kPa: suggestive of cACLD, but needs further testing >13 kPa: highly suggestive of cACLD > or =17 kPa: highly suggestive of cACLD with an increased probability of clinically significant portal hypertension IMPRESSION: ULTRASOUND RUQ: Previous cholecystectomy.  No ductal dilatation. Fatty liver infiltration with some cystic areas. Lesion on the right hepatic lobe is complex with internal echoes. Overall recommend further workup if there is no more recent prior for comparison such as dynamic CT or MRI. ULTRASOUND HEPATIC ELASTOGRAPHY: Median kPa:  5.4 Diagnostic category: < or = 9 kPa: in the absence of other known clinical signs, rules out cACLD Electronically Signed   By: Karen Kays M.D.   On: 08/22/2022 11:13    ASSESSMENT & PLAN:   89 y.o. very pleasant gentleman with:  1) Hereditary hemochromatosis with compound heterozygous state for C282Y and H63D mutations 2) history of chronic active hepatitis and Iron overload on liver biopsy in 2011. - LFTS from 09/13/22 continue to be WNL    PLAN -Discussed patient's lab results from today, which showed stable CBC  - CMP consistent with mild dehydration, Na of 130, Cl of 93: He states that he had been drinking less over the last few days to avoid waking up to urinate in the middle of the night; Encouraged increased hydration. - K of 3.3 today- encouraged him to eat foods with higher potassium content. -Ferritin of 158 and iron saturation of 48% - Discussed RUQ abdominal US results from 08/21/22 showing: Previous cholecystectomy.  No ductal dilatation. Fatty liver infiltration with some cystic areas. Lesion on the right hepatic lobe is complex with internal echoes. - Extensively discussed pathophysiology of hemochromatosis. - Will check AFP tumor marker. - Agreed to order CT liver scan for further investigation. - Will proceed with therapeutic  phlebotomy today with goals to keep ferritin  about 100. -He will continue his therapeutic phlebotomy every 6 months.   FOLLOW UP CT scan results discussion via telephone visit  in 6 weeks. Continue his therapeutic phlebotomy every 6 months.   The total time spent in the appointment was 23 minutes*.  All of the patient's questions were answered with apparent satisfaction. The patient knows to call the clinic with any problems, questions or concerns.  Wyvonnia Lora MD MS AAHIVMS [facility] Hematology/Oncology Physician [facility]  *Total Encounter Time as defined by the Centers for Medicare and Medicaid Services includes, in addition to the face-to-face time of a patient visit (documented in the note above) non-face-to-face time: obtaining and reviewing outside history, ordering and reviewing medications, tests or procedures, care coordination (communications with other health care professionals or caregivers) and documentation in the medical record.   I,Alexis Herring,acting as a Neurosurgeon for Wyvonnia Lora, MD.,have documented all relevant documentation on the behalf of Wyvonnia Lora, MD,as directed by  Wyvonnia Lora, MD while in the presence of Wyvonnia Lora, MD.  .I have reviewed the above documentation for accuracy and completeness, and I agree with the above. Johney Maine MD

## 2022-09-13 NOTE — Progress Notes (Signed)
Patient seen by Dr. Addison Naegeli are within treatment parameters.  Labs reviewed: and are within treatment parameters./ Dr Candise Che aware of CBC and CMP results. No need to wait for Ferritin and Iron to result prior to Phlebotomy   Per physician team, patient is ready for treatment and there are NO modifications to the treatment plan.

## 2022-09-13 NOTE — Progress Notes (Signed)
Fluid analysis does not show obvious gout.  No sign of infection.  Culture is  still pending.

## 2022-09-13 NOTE — Progress Notes (Signed)
Andrew [name] presents today for phlebotomy per MD orders. Hemoglobin of 14.7- order to perform phlebotomy if >12. Phlebotomy procedure started at 1135 and ended at 1147. 507 grams removed via 16 G needle to RAC. IV needle removed intact. Patient provided with food and drink following procedure.  Patient observed until 12:15. Vital signs retaken and remained stable upon discharge. BP 135/72 (BP Location: Left Arm, Patient Position: Sitting)   Pulse 66   Temp 98.1 F (36.7 C) (Oral)   Resp 18   SpO2 100%    Patient tolerated procedure well. Ambulatory to lobby.

## 2022-09-14 ENCOUNTER — Encounter: Payer: Self-pay | Admitting: Hematology

## 2022-09-14 LAB — ANAEROBIC AND AEROBIC CULTURE

## 2022-09-15 LAB — AFP TUMOR MARKER: AFP, Serum, Tumor Marker: 1097 ng/mL — ABNORMAL HIGH (ref 0.0–6.4)

## 2022-09-17 LAB — SYNOVIAL FLUID ANALYSIS, COMPLETE
Basophils, %: 0 %
Eosinophils-Synovial: 0 % (ref 0–2)
Synoviocytes, %: 0 % (ref 0–15)
WBC, Synovial: 1713 cells/uL — ABNORMAL HIGH (ref <150)

## 2022-09-17 LAB — ANAEROBIC AND AEROBIC CULTURE
AER RESULT:: NO GROWTH
MICRO NUMBER:: 14794617
MICRO NUMBER:: 14794618
SPECIMEN QUALITY:: ADEQUATE
SPECIMEN QUALITY:: ADEQUATE

## 2022-09-18 NOTE — Progress Notes (Signed)
Culture is negative.  No sign of infection.

## 2022-09-29 ENCOUNTER — Ambulatory Visit (HOSPITAL_COMMUNITY)
Admission: RE | Admit: 2022-09-29 | Discharge: 2022-09-29 | Disposition: A | Discharge status: Home / Self Care | Payer: Medicare Other | Source: Ambulatory Visit | Attending: Hematology | Admitting: Hematology

## 2022-09-29 DIAGNOSIS — K7460 Unspecified cirrhosis of liver: Secondary | ICD-10-CM | POA: Diagnosis not present

## 2022-09-29 DIAGNOSIS — K769 Liver disease, unspecified: Secondary | ICD-10-CM | POA: Insufficient documentation

## 2022-09-29 DIAGNOSIS — N281 Cyst of kidney, acquired: Secondary | ICD-10-CM | POA: Diagnosis not present

## 2022-09-29 MED ORDER — IOHEXOL 300 MG/ML  SOLN
100.0000 mL | Freq: Once | INTRAMUSCULAR | Status: AC | PRN
  Administered 2022-09-29: 100 mL via INTRAVENOUS

## 2022-10-05 ENCOUNTER — Other Ambulatory Visit: Payer: Self-pay

## 2022-10-05 ENCOUNTER — Inpatient Hospital Stay: Payer: Medicare Other | Attending: Hematology | Admitting: Hematology

## 2022-10-05 VITALS — BP 110/63 | HR 72 | Temp 97.5°F | Resp 18 | Wt 186.6 lb

## 2022-10-05 DIAGNOSIS — C220 Liver cell carcinoma: Secondary | ICD-10-CM

## 2022-10-05 DIAGNOSIS — Z79899 Other long term (current) drug therapy: Secondary | ICD-10-CM | POA: Insufficient documentation

## 2022-10-05 DIAGNOSIS — Z7189 Other specified counseling: Secondary | ICD-10-CM

## 2022-10-05 DIAGNOSIS — K769 Liver disease, unspecified: Secondary | ICD-10-CM | POA: Diagnosis not present

## 2022-10-05 NOTE — Progress Notes (Signed)
HEMATOLOGY/ONCOLOGY CLINIC NOTE  Date of Service: 10/05/22    Patient Care Team: Merri Brunette, MD as PCP - General (Internal Medicine) Lennette Bihari, MD as PCP - Cardiology (Cardiology)  CHIEF COMPLAINTS/PURPOSE OF CONSULTATION:  Continued evaluation and management of Hereditary hemochromatosis  HISTORY OF PRESENTING ILLNESS:   Andrew [name] is here for continued evaluation and management of his hereditary hemochromatosis and newly diagnosed HCC. Patient was last seen by me on 09/13/2022 and reported receiving a cortisone injection in his right knee which resolved gout pain.  Today, he reports that he is feeling well overall with no new symptoms. He denies any abdominal distention/pain, SOB, or chest pain.  He reports that he did previously test positive for hepatitis B antibodies, but was never infected.   He reports that he had a cholecystectomy several years ago and was told he may have a fatty liver. Patient did have a liver biopsy in 2011 and did receive phlebotomies in the past.  MEDICAL HISTORY:  Past Medical History:  Diagnosis Date   Adenomatous colon polyp 10/1993   Diverticulosis    Hemochromatosis    Compound Heterozygous for C282Y & H63D mutations   Hepatitis B antibody positive    Hypertension    OSA treated with BiPAP    PAF (paroxysmal atrial fibrillation) (HCC)     SURGICAL HISTORY: Past Surgical History:  Procedure Laterality Date   CARDIOVERSION  08/18/2011   Procedure: CARDIOVERSION;  Surgeon: Lennette Bihari, MD;  Location: [facility] OR;  Service: Cardiovascular;  Laterality: N/A;   CARDIOVERSION  11/22/2011   Procedure: CARDIOVERSION;  Surgeon: Lennette Bihari, MD;  Location: [facility] OR;  Service: Cardiovascular;  Laterality: N/A;   COLONOSCOPY     KNEE ARTHROSCOPY     LAPAROSCOPIC CHOLECYSTECTOMY  04/2009   NM MYOCAR PERF WALL MOTION  05/08/2008   Normal   TONSILLECTOMY     US ECHOCARDIOGRAPHY  07/27/2011   Mild concentric LVH, LA & RA is moderately  dilated, mild to mod MR.    SOCIAL HISTORY: Social History   Socioeconomic History   Marital status: Married    Spouse name: Not on file   Number of children: Not on file   Years of education: Not on file   Highest education level: Not on file  Occupational History   Occupation: Retired    Associate Professor: RETIRED  Tobacco Use   Smoking status: Former   Smokeless tobacco: Never  Substance and Sexual Activity   Alcohol use: Yes    Alcohol/week: 0.0 standard drinks of alcohol    Comment: 2 glasses if wine daily   Drug use: No   Sexual activity: Not on file  Other Topics Concern   Not on file  Social History Narrative   Not on file   Social Determinants of Health   Financial Resource Strain: Not on file  Food Insecurity: Not on file  Transportation Needs: Not on file  Physical Activity: Not on file  Stress: Not on file  Social Connections: Not on file  Intimate Partner Violence: Not on file    FAMILY HISTORY: Family History  Problem Relation Age of Onset   Esophageal cancer Father    Emphysema Mother    Stroke Brother     ALLERGIES:  has No Known Allergies.  MEDICATIONS:  Current Outpatient Medications  Medication Sig Dispense Refill   amLODipine (NORVASC) 5 MG tablet Take 5 mg by mouth daily.     colchicine 0.6 MG tablet Take 1 tablet (  0.6 mg total) by mouth daily as needed (gout or psuedogout pain). 30 tablet 2   finasteride (PROSCAR) 5 MG tablet Take 5 mg by mouth daily.     lisinopril (PRINIVIL,ZESTRIL) 40 MG tablet Take 40 mg by mouth daily.     metoprolol succinate (TOPROL-XL) 25 MG 24 hr tablet Alternate taking 25mg  and 12.5mg  daily 135 tablet 3   rivaroxaban (XARELTO) 20 MG TABS tablet Take 1 tablet (20 mg total) by mouth daily with supper. (Patient taking differently: Take 15 mg by mouth daily with supper.) 90 tablet 3   rosuvastatin (CRESTOR) 5 MG tablet Take 5 mg by mouth daily.     silodosin (RAPAFLO) 8 MG CAPS capsule Take 8 mg by mouth daily.     No  current facility-administered medications for this visit.    REVIEW OF SYSTEMS:    10 Point review of Systems was done is negative except as noted above.   PHYSICAL EXAMINATION: ECOG PERFORMANCE STATUS:   . Vitals:   10/05/22 1228  BP: 110/63  Pulse: 72  Resp: 18  Temp: (!) 97.5 F (36.4 C)  SpO2: 99%     Filed Weights   10/05/22 1228  Weight: 186 lb 9.6 oz (84.6 kg)     .Body mass index is 26.03 kg/m.   GENERAL:alert, in no acute distress and comfortable SKIN: no acute rashes, no significant lesions EYES: conjunctiva are pink and non-injected, sclera anicteric OROPHARYNX: MMM, no exudates, no oropharyngeal erythema or ulceration NECK: supple, no JVD LYMPH:  no palpable lymphadenopathy in the cervical, axillary or inguinal regions LUNGS: clear to auscultation b/l with normal respiratory effort HEART: regular rate & rhythm ABDOMEN:  normoactive bowel sounds , non tender, not distended.mild TTP RUQ. Extremity: no pedal edema PSYCH: alert & oriented x 3 with fluent speech NEURO: no focal motor/sensory deficits   LABORATORY DATA:  I have reviewed the data as listed  .    Latest Ref Rng & Units 09/13/2022   10:22 AM 03/15/2022    8:48 AM 11/23/2021    8:49 AM  CBC  WBC 4.0 - 10.5 K/uL 14.8  7.5  7.3   Hemoglobin 13.0 - 17.0 g/dL 78.2  95.6  21.3   Hematocrit 39.0 - 52.0 % 39.9  41.2  42.0   Platelets 150 - 400 K/uL 213  199  165     .    Latest Ref Rng & Units 09/13/2022   10:22 AM 05/08/2022    4:01 PM 03/15/2022    8:48 AM  CMP  Glucose 70 - 99 mg/dL 086  578  469   BUN 8 - 23 mg/dL 27  21  22    Creatinine 0.61 - 1.24 mg/dL 6.29  5.28  4.13   Sodium 135 - 145 mmol/L 130  139  135   Potassium 3.5 - 5.1 mmol/L 3.3  3.6  3.2   Chloride 98 - 111 mmol/L 93  95  97   CO2 22 - 32 mmol/L 29  28  32   Calcium 8.9 - 10.3 mg/dL 24.4  9.9  9.4   Total Protein 6.5 - 8.1 g/dL 8.0  7.1  7.6   Total Bilirubin 0.3 - 1.2 mg/dL 0.9  0.9  1.6   Alkaline Phos 38 -  126 U/L 104  104  101   AST 15 - 41 U/L 25  38  21   ALT 0 - 44 U/L 23  29  18     . Lab Results  Component Value Date   IRON 127 09/13/2022   TIBC 266 09/13/2022   IRONPCTSAT 48 (H) 09/13/2022   (Iron and TIBC)  Lab Results  Component Value Date   FERRITIN 158 09/13/2022     RADIOGRAPHIC STUDIES: I have personally reviewed the radiological images as listed and agreed with the findings in the report. CT LIVER ABD W/WO  Result Date: 10/03/2022 CLINICAL DATA:  History of hemochromatosis and hepatitis-C with complex right hepatic lobe lesion on recent ultrasound abdomen EXAM: CT ABDOMEN WITHOUT AND WITH CONTRAST TECHNIQUE: Multidetector CT imaging of the abdomen was performed following the standard protocol before and following the bolus administration of intravenous contrast. RADIATION DOSE REDUCTION: This exam was performed according to the departmental dose-optimization program which includes automated exposure control, adjustment of the mA and/or kV according to patient size and/or use of iterative reconstruction technique. CONTRAST:  OMNIPAQUE IOHEXOL 300 MG/ML  SOLN COMPARISON:  Ultrasound abdomen dated 08/21/2022 FINDINGS: Lower chest: No focal consolidation or pulmonary nodule in the lung bases. Bibasilar subpleural reticulations, which may reflect fibrotic changes. No pleural effusion or pneumothorax demonstrated. Multichamber cardiomegaly. Coronary artery calcifications. Hepatobiliary: Cirrhotic morphology. Bilobar heterogeneously arterially enhancing masses demonstrate washout and suggestion of peripheral capsule: -4.5 x 4.2 cm anterior segment 3 (16:22) -5.4 x 3.5 cm posterior segment 3 (6:31) -2.5 x 1.9 cm posterior segment 6 (16:30) Hypoattenuating focus in segment 8 measuring 2.5 x 2.0 cm (2:20) demonstrates questionable ill-defined gradual enhancement (11:17). Nonenhancing segment 7 cyst measures 1.0 cm (16:13). No intra or extrahepatic biliary ductal dilation.  Cholecystectomy. Pancreas: 1.4 cm fluid attenuation focus within the anterior pancreatic head (6:42). An additional fluid-attenuation focus is seen in the pancreatic tail (6:40). No main ductal dilation. Spleen: Normal in size without focal abnormality. Adrenals/Urinary Tract: No adrenal nodules. No suspicious renal masses, calculi or hydronephrosis. Bilateral simple cysts. No specific follow-up imaging recommended. Stomach/Bowel: Small hiatal hernia. No evidence of bowel wall thickening, distention, or inflammatory changes. Colonic diverticulosis without acute diverticulitis. Vascular/Lymphatic: Aortic atherosclerosis. 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 No enlarged abdominal lymph nodes. Other: No free fluid, fluid collection, or free air. Partially imaged prostate gland demonstrates median lobe hypertrophy. Musculoskeletal: No acute or abnormal lytic or blastic osseous lesions. Multilevel degenerative changes of the partially imaged thoracic and lumbar spine. Partially imaged pelvis demonstrates bilateral fat-containing inguinal hernias. IMPRESSION: 1. Bilobar heterogeneously arterially enhancing hepatic masses demonstrate washout and suggestion of peripheral capsule, LI-RADS 5. 2. Hypoattenuating focus in hepatic segment 8 measuring 2.5 x 2.0 cm demonstrates questionable ill-defined gradual enhancement. Finding could represent an additional focus of hepatocellular carcinoma versus a vascular lesion. LI-RADS 3. 3. Fluid attenuation foci within the pancreatic head and tail, likely side branch intraductal papillary mucinous neoplasms (IPMN). No main ductal dilation, mass lesion, or abnormal enhancement. Recommend attention on follow-up. 4. Aortic Atherosclerosis (ICD10-I70.0). Coronary artery calcifications. Assessment for potential risk factor modification, dietary therapy or pharmacologic therapy may be warranted, if clinically indicated. These results will be called to the ordering clinician  or representative by the Radiologist Assistant, and communication documented in the PACS or [employer]. Electronically Signed   By: Agustin Cree M.D.   On: 10/03/2022 14:13   [place] LIMITED JOINT SPACE STRUCTURES LOW RIGHT(NO LINKED CHARGES)  Result Date: 09/21/2022 Procedure: Real-time Ultrasound Guided aspiration and injection right knee joint superior lateral patellar space Device: Philips Affiniti 50G Images permanently stored and available for review in PACS Verbal informed consent obtained.  Discussed risks and benefits of procedure. Warned  about infection, bleeding, hyperglycemia damage to structures among others. Patient expresses understanding and agreement Time-out conducted.  Noted no overlying erythema, induration, or other signs of local infection.  Skin prepped in a sterile fashion.  Local anesthesia: Topical Ethyl chloride.  With sterile technique and under real time ultrasound guidance: 2 mL of lidocaine injected subcutaneously achieving good anesthesia. Skin was again sterilized with isopropyl alcohol. 18-gauge needle was used to access the joint capsule and 60 mL of cloudy yellow fluid was aspirated. Syringe was exchanged and 40 mg of Kenalog and 2 L of Marcaine were injected into the knee joint.  Completed without difficulty  Pain moderately immediately resolved suggesting accurate placement of the medication.  Advised to call if fevers/chills, erythema, induration, drainage, or persistent bleeding.  Images permanently stored and available for review in the ultrasound unit. Impression: Technically successful ultrasound guided injection.   DG Knee AP/LAT W/Sunrise Left  Result Date: 09/11/2022 CLINICAL DATA:  Bilateral knee pain EXAM: LEFT KNEE 3 VIEWS COMPARISON:  None Available. FINDINGS: Chondrocalcinosis. Partial but significant loss of joint space medially. Patellofemoral degenerative changes with moderate osteophytes. No joint effusion. Probable loose bodies identified posteriorly on  the lateral view. No other abnormalities. IMPRESSION: 1. Chondrocalcinosis. 2. Partial but significant loss of joint space medially. 3. Patellofemoral degenerative changes. 4. Loose bodies posteriorly on the lateral view. Electronically Signed   By: Gerome Sam III M.D.   On: 09/11/2022 17:46   DG Knee AP/LAT W/Sunrise Right  Result Date: 09/11/2022 CLINICAL DATA:  Pain EXAM: RIGHT KNEE 3 VIEWS COMPARISON:  None Available. FINDINGS: Chondrocalcinosis. Patellofemoral degenerative change, mild. Probable small joint effusion. Mild loss of joint space in the medial compartment. No other significant abnormalities. IMPRESSION: 1. Chondrocalcinosis consistent with CPPD. 2. Mild patellofemoral degenerative change. 3. Mild loss of joint space in the medial compartment. 4. Probable small joint effusion. Electronically Signed   By: Gerome Sam III M.D.   On: 09/11/2022 17:44    ASSESSMENT & PLAN:   89 year old very pleasant gentleman with:  1) Hereditary hemochromatosis with compound heterozygous state for C282Y and H63D mutations 2) history of chronic active hepatitis and Iron overload on liver biopsy in 2011. Currently LFTs wnl. 3) Newly diagnosed  PLAN  -liver biopsy 2011 showed chronic active hepatitis with increased iron, chronic inflammation, patching scarring in liver, did not meet criteria of liver sclerosis at that time. Could not rule out autoimmune hepatitis at that time.  -patient last saw Dr. Russella Dar in 2020 for phlebotomies -Discussed results of 09/29/2022 CT liver/abdm which showed 4 lesions, liver does look scarred based on CT scan.  -AFP tumor marker  elevated -overall findings concerning for liver cancer-- multifocal unresectable. Lirads 5 lesion noted. -discussed that liver cirrhosis is risk factor for developing liver cancer -discussed option of biopsy in one of the lesions for further evaluation -would recommend CT scan of remaining body to ensure that disease is confined to  liver and has not spread to other areas -will hold blood thinners if patient decides to proceed with biopsy -discussed that even if confined, disease is not curable -discussed that disease is not as sensitive to chemotherapy. Discussed details of treatment options which is typically a combination of oral targeted therapyt or immunotherapy and targeted treatment and targeted therapy by interventional radiologist -discussed possible outcomes of progression of disease such as jaundice, swelling in the abdm, lower extremities, waste product accumulation, fatigue, or pain -discussed option of 24/7 hospice care -discussed that local radiation would be the most  effective treatment, but spot treating may be limiting if disease continues to grow in other areas -will order CT chest scan -patient would like to take some time to discuss with his family prior to making a proceeding decision regarding treatment -will cancel remaining therapeutic phlebotomies to avoid any worsened weakness -answered all of patient's questions regarding treatment and prognosis in detail  FOLLOW-UP: Plz cancel all therapeutic phlebotomy appointments CT chest wo contrast in 2-3 days Phone visit with Dr Candise Che in 1 week  The total time spent in the appointment was 40 minutes* .  All of the patient's questions were answered with apparent satisfaction. The patient knows to call the clinic with any problems, questions or concerns.   Wyvonnia Lora MD MS AAHIVMS [facility] Hematology/Oncology Physician [facility]  .*Total Encounter Time as defined by the Centers for Medicare and Medicaid Services includes, in addition to the face-to-face time of a patient visit (documented in the note above) non-face-to-face time: obtaining and reviewing outside history, ordering and reviewing medications, tests or procedures, care coordination (communications with other health care professionals or caregivers) and documentation in the  medical record.    I,Mitra Faeizi,acting as a Neurosurgeon for Wyvonnia Lora, MD.,have documented all relevant documentation on the behalf of Wyvonnia Lora, MD,as directed by  Wyvonnia Lora, MD while in the presence of Wyvonnia Lora, MD.  .I have reviewed the above documentation for accuracy and completeness, and I agree with the above. Johney Maine MD

## 2022-10-06 ENCOUNTER — Telehealth: Payer: Self-pay | Admitting: Hematology

## 2022-10-10 ENCOUNTER — Ambulatory Visit (INDEPENDENT_AMBULATORY_CARE_PROVIDER_SITE_OTHER): Payer: Medicare Other | Admitting: Family Medicine

## 2022-10-10 ENCOUNTER — Encounter: Payer: Self-pay | Admitting: Family Medicine

## 2022-10-10 VITALS — BP 110/60 | HR 75 | Ht 71.0 in | Wt 186.6 lb

## 2022-10-10 DIAGNOSIS — C220 Liver cell carcinoma: Secondary | ICD-10-CM | POA: Diagnosis not present

## 2022-10-10 DIAGNOSIS — M25562 Pain in left knee: Secondary | ICD-10-CM

## 2022-10-10 DIAGNOSIS — M25561 Pain in right knee: Secondary | ICD-10-CM | POA: Diagnosis not present

## 2022-10-10 DIAGNOSIS — G8929 Other chronic pain: Secondary | ICD-10-CM

## 2022-10-10 NOTE — Progress Notes (Signed)
   I, Andrew [name], CMA acting as a scribe for Andrew Graham, MD.  Andrew [name] is a 89 y.o. male who presents to [facility] today for 48-month f/u bilat knee pain, R>L. Pt was last seen by Dr. Denyse Amass on 09/11/22 and his R knee was aspirated and injected. Today, pt reports improvement of knee symptoms s/p aspiration.   Dx testing: 09/11/22 R & L knee XR and synovial fluid cultures  He notes that he was recently diagnosed with hepatocellular carcinoma.  He has a history of hemochromatosis and fatty liver disease and some liver cirrhosis.  He is still in the process of being evaluated and is not sure what is going to.  He is not excited about the idea of chemotherapy or surgery.  Pertinent review of systems: No fevers or chills  Relevant historical information: Hypertension.  Hepatocellular carcinoma.   Exam:  BP 110/60   Pulse 75   Ht 5\' 11"  (1.803 m)   Wt 186 lb 9.6 oz (84.6 kg)   SpO2 97%   BMI 26.03 kg/m  General: Well Developed, well nourished, and in no acute distress.   MSK: Right knee range of motion intact.  Normal gait.     Assessment and Plan: 89 y.o. male with knee pain resolved following injection.  Plan for watchful waiting.  Can repeat steroid injection anytime.  We spent a lot of time talking about his recent hepatocellular carcinoma diagnosis.  We talked a little bit about hospice or palliative care.  He is feeling pretty good now so those are probably not urgent.  His medical team will provide services as needed.  I will be happy to see him anytime for musculoskeletal issues along the way.     Discussed warning signs or symptoms. Please see discharge instructions. Patient expresses understanding.   The above documentation has been reviewed and is accurate and complete Andrew [name], M.D.

## 2022-10-10 NOTE — Patient Instructions (Signed)
Thank you for coming in today.   We can continue normal care for your joints.   I will make sure Dr Renne Crigler knows.   If you have any questions please let me know.

## 2022-10-11 ENCOUNTER — Ambulatory Visit (HOSPITAL_BASED_OUTPATIENT_CLINIC_OR_DEPARTMENT_OTHER)
Admission: RE | Admit: 2022-10-11 | Discharge: 2022-10-11 | Disposition: A | Discharge status: Home / Self Care | Payer: Medicare Other | Source: Ambulatory Visit | Attending: Hematology | Admitting: Hematology

## 2022-10-11 ENCOUNTER — Encounter: Payer: Self-pay | Admitting: Hematology

## 2022-10-11 DIAGNOSIS — R918 Other nonspecific abnormal finding of lung field: Secondary | ICD-10-CM | POA: Diagnosis not present

## 2022-10-11 DIAGNOSIS — C220 Liver cell carcinoma: Secondary | ICD-10-CM | POA: Diagnosis not present

## 2022-10-17 ENCOUNTER — Inpatient Hospital Stay (HOSPITAL_BASED_OUTPATIENT_CLINIC_OR_DEPARTMENT_OTHER): Payer: Medicare Other | Admitting: Hematology

## 2022-10-17 DIAGNOSIS — C220 Liver cell carcinoma: Secondary | ICD-10-CM

## 2022-10-17 DIAGNOSIS — Z7189 Other specified counseling: Secondary | ICD-10-CM | POA: Diagnosis not present

## 2022-10-17 NOTE — Progress Notes (Signed)
HEMATOLOGY/ONCOLOGY PHONE VISIT NOTE  Date of Service: 10/17/22    Patient Care Team: Merri Brunette, MD as PCP - General (Internal Medicine) Lennette Bihari, MD as PCP - Cardiology (Cardiology)  CHIEF COMPLAINTS/PURPOSE OF CONSULTATION:  Continued evaluation and management of Hereditary hemochromatosis  HISTORY OF PRESENTING ILLNESS:   Andrew [name] is here for continued evaluation and management of his hereditary hemochromatosis and newly diagnosed HCC.   Patient was last seen by me on 10/05/2022 and he was doing well overall.   .I connected with Calloway Liszka Jay on 05/14/2024at  8:40 AM EDT by telephone visit and verified that I am speaking with the correct person using two identifiers.   Patient reports he has been doing well overall since our last visit. He denies fever, chills, night sweats, unexpected weight loss, abdominal pain, back pain, chest pain, or leg swelling.   We discussed CT Chest and CT Liver/Abdominal results from 10/11/2022 and 09/29/2022, respectfully. Also, discussed next options with the patient and his wife.    I discussed the limitations, risks, security and privacy concerns of performing an evaluation and management service by telemedicine and the availability of in-person appointments. I also discussed with the patient that there may be a patient responsible charge related to this service. The patient expressed understanding and agreed to proceed.   Other persons participating in the visit and their role in the encounter: Wife   Patient's location: Home  Provider's location: [facility]   Chief Complaint: [facility]    MEDICAL HISTORY:  Past Medical History:  Diagnosis Date   Adenomatous colon polyp 10/1993   Diverticulosis    Hemochromatosis    Compound Heterozygous for C282Y & H63D mutations   Hepatitis B antibody positive    Hypertension    OSA treated with BiPAP    PAF (paroxysmal atrial fibrillation) (HCC)     SURGICAL HISTORY: Past Surgical  History:  Procedure Laterality Date   CARDIOVERSION  08/18/2011   Procedure: CARDIOVERSION;  Surgeon: Lennette Bihari, MD;  Location: [facility] OR;  Service: Cardiovascular;  Laterality: N/A;   CARDIOVERSION  11/22/2011   Procedure: CARDIOVERSION;  Surgeon: Lennette Bihari, MD;  Location: [facility] OR;  Service: Cardiovascular;  Laterality: N/A;   COLONOSCOPY     KNEE ARTHROSCOPY     LAPAROSCOPIC CHOLECYSTECTOMY  04/2009   NM MYOCAR PERF WALL MOTION  05/08/2008   Normal   TONSILLECTOMY     US ECHOCARDIOGRAPHY  07/27/2011   Mild concentric LVH, LA & RA is moderately dilated, mild to mod MR.    SOCIAL HISTORY: Social History   Socioeconomic History   Marital status: Married    Spouse name: Not on file   Number of children: Not on file   Years of education: Not on file   Highest education level: Not on file  Occupational History   Occupation: Retired    Associate Professor: RETIRED  Tobacco Use   Smoking status: Former   Smokeless tobacco: Never  Substance and Sexual Activity   Alcohol use: Yes    Alcohol/week: 0.0 standard drinks of alcohol    Comment: 2 glasses if wine daily   Drug use: No   Sexual activity: Not on file  Other Topics Concern   Not on file  Social History Narrative   Not on file   Social Determinants of Health   Financial Resource Strain: Not on file  Food Insecurity: Not on file  Transportation Needs: Not on file  Physical Activity: Not on file  Stress: Not on file  Social Connections: Not on file  Intimate Partner Violence: Not on file    FAMILY HISTORY: Family History  Problem Relation Age of Onset   Esophageal cancer Father    Emphysema Mother    Stroke Brother     ALLERGIES:  has No Known Allergies.  MEDICATIONS:  Current Outpatient Medications  Medication Sig Dispense Refill   amLODipine (NORVASC) 5 MG tablet Take 5 mg by mouth daily.     colchicine 0.6 MG tablet Take 1 tablet (0.6 mg total) by mouth daily as needed (gout or psuedogout pain). 30 tablet 2    finasteride (PROSCAR) 5 MG tablet Take 5 mg by mouth daily.     lisinopril (PRINIVIL,ZESTRIL) 40 MG tablet Take 40 mg by mouth daily.     metoprolol succinate (TOPROL-XL) 25 MG 24 hr tablet Alternate taking 25mg  and 12.5mg  daily 135 tablet 3   rivaroxaban (XARELTO) 20 MG TABS tablet Take 1 tablet (20 mg total) by mouth daily with supper. (Patient taking differently: Take 15 mg by mouth daily with supper.) 90 tablet 3   rosuvastatin (CRESTOR) 5 MG tablet Take 5 mg by mouth daily.     silodosin (RAPAFLO) 8 MG CAPS capsule Take 8 mg by mouth daily.     No current facility-administered medications for this visit.    REVIEW OF SYSTEMS:    10 Point review of Systems was done is negative except as noted above.   PHYSICAL EXAMINATION: TELE-MED VISIT  LABORATORY DATA:  I have reviewed the data as listed  .    Latest Ref Rng & Units 09/13/2022   10:22 AM 03/15/2022    8:48 AM 11/23/2021    8:49 AM  CBC  WBC 4.0 - 10.5 K/uL 14.8  7.5  7.3   Hemoglobin 13.0 - 17.0 g/dL 09.8  11.9  14.7   Hematocrit 39.0 - 52.0 % 39.9  41.2  42.0   Platelets 150 - 400 K/uL 213  199  165     .    Latest Ref Rng & Units 09/13/2022   10:22 AM 05/08/2022    4:01 PM 03/15/2022    8:48 AM  CMP  Glucose 70 - 99 mg/dL 829  562  130   BUN 8 - 23 mg/dL 27  21  22    Creatinine 0.61 - 1.24 mg/dL 8.65  7.84  6.96   Sodium 135 - 145 mmol/L 130  139  135   Potassium 3.5 - 5.1 mmol/L 3.3  3.6  3.2   Chloride 98 - 111 mmol/L 93  95  97   CO2 22 - 32 mmol/L 29  28  32   Calcium 8.9 - 10.3 mg/dL 29.5  9.9  9.4   Total Protein 6.5 - 8.1 g/dL 8.0  7.1  7.6   Total Bilirubin 0.3 - 1.2 mg/dL 0.9  0.9  1.6   Alkaline Phos 38 - 126 U/L 104  104  101   AST 15 - 41 U/L 25  38  21   ALT 0 - 44 U/L 23  29  18     . Lab Results  Component Value Date   IRON 127 09/13/2022   TIBC 266 09/13/2022   IRONPCTSAT 48 (H) 09/13/2022   (Iron and TIBC)  Lab Results  Component Value Date   FERRITIN 158 09/13/2022      RADIOGRAPHIC STUDIES: I have personally reviewed the radiological images as listed and agreed with the findings in the report.  CT CHEST WO CONTRAST  Result Date: 10/13/2022 CLINICAL DATA:  Hepatocellular carcinoma; * Tracking Code: BO * EXAM: CT CHEST WITHOUT CONTRAST TECHNIQUE: Multidetector CT imaging of the chest was performed following the standard protocol without IV contrast. RADIATION DOSE REDUCTION: This exam was performed according to the departmental dose-optimization program which includes automated exposure control, adjustment of the mA and/or kV according to patient size and/or use of iterative reconstruction technique. COMPARISON:  CT abdomen dated September 29, 2022 FINDINGS: Cardiovascular: Cardiomegaly. No pericardial effusion. Normal caliber thoracic aorta with moderate atherosclerotic disease. Moderate three-vessel coronary artery calcifications. Mediastinum/Nodes: Small hiatal hernia. Thyroid is unremarkable. No enlarged lymph nodes seen in the chest. Lungs/Pleura: Central airways are patent. Mild lower lung and subpleural predominant reticular opacities with probable areas of traction bronchiolectasis. No suspicious pulmonary nodules. No consolidation, pleural effusion or pneumothorax. Upper Abdomen: Numerous hepatic lesions, better evaluated on prior abdomen CT dated September 29, 2022. No acute abnormality. Musculoskeletal: No chest wall mass or suspicious bone lesions identified. IMPRESSION: 1. No evidence of metastatic disease in the chest. 2. Mild lower lung and subpleural predominant reticular opacities, findings are suggestive of fibrotic interstitial lung disease. Correlate with patient's systems and consider pulmonary function tests and/or ILD protocol CT for further evaluation. 3. Three-vessel coronary artery calcifications. 4. Aortic Atherosclerosis (ICD10-I70.0). Electronically Signed   By: Allegra Lai M.D.   On: 10/13/2022 16:21   CT LIVER ABD W/WO  Result Date:  10/03/2022 CLINICAL DATA:  History of hemochromatosis and hepatitis-C with complex right hepatic lobe lesion on recent ultrasound abdomen EXAM: CT ABDOMEN WITHOUT AND WITH CONTRAST TECHNIQUE: Multidetector CT imaging of the abdomen was performed following the standard protocol before and following the bolus administration of intravenous contrast. RADIATION DOSE REDUCTION: This exam was performed according to the departmental dose-optimization program which includes automated exposure control, adjustment of the mA and/or kV according to patient size and/or use of iterative reconstruction technique. CONTRAST:  OMNIPAQUE IOHEXOL 300 MG/ML  SOLN COMPARISON:  Ultrasound abdomen dated 08/21/2022 FINDINGS: Lower chest: No focal consolidation or pulmonary nodule in the lung bases. Bibasilar subpleural reticulations, which may reflect fibrotic changes. No pleural effusion or pneumothorax demonstrated. Multichamber cardiomegaly. Coronary artery calcifications. Hepatobiliary: Cirrhotic morphology. Bilobar heterogeneously arterially enhancing masses demonstrate washout and suggestion of peripheral capsule: -4.5 x 4.2 cm anterior segment 3 (16:22) -5.4 x 3.5 cm posterior segment 3 (6:31) -2.5 x 1.9 cm posterior segment 6 (16:30) Hypoattenuating focus in segment 8 measuring 2.5 x 2.0 cm (2:20) demonstrates questionable ill-defined gradual enhancement (11:17). Nonenhancing segment 7 cyst measures 1.0 cm (16:13). No intra or extrahepatic biliary ductal dilation. Cholecystectomy. Pancreas: 1.4 cm fluid attenuation focus within the anterior pancreatic head (6:42). An additional fluid-attenuation focus is seen in the pancreatic tail (6:40). No main ductal dilation. Spleen: Normal in size without focal abnormality. Adrenals/Urinary Tract: No adrenal nodules. No suspicious renal masses, calculi or hydronephrosis. Bilateral simple cysts. No specific follow-up imaging recommended. Stomach/Bowel: Small hiatal hernia. No evidence of  bowel wall thickening, distention, or inflammatory changes. Colonic diverticulosis without acute diverticulitis. Vascular/Lymphatic: Aortic atherosclerosis. 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 No enlarged abdominal lymph nodes. Other: No free fluid, fluid collection, or free air. Partially imaged prostate gland demonstrates median lobe hypertrophy. Musculoskeletal: No acute or abnormal lytic or blastic osseous lesions. Multilevel degenerative changes of the partially imaged thoracic and lumbar spine. Partially imaged pelvis demonstrates bilateral fat-containing inguinal hernias. IMPRESSION: 1. Bilobar heterogeneously arterially enhancing hepatic masses demonstrate washout and suggestion of peripheral capsule,  LI-RADS 5. 2. Hypoattenuating focus in hepatic segment 8 measuring 2.5 x 2.0 cm demonstrates questionable ill-defined gradual enhancement. Finding could represent an additional focus of hepatocellular carcinoma versus a vascular lesion. LI-RADS 3. 3. Fluid attenuation foci within the pancreatic head and tail, likely side branch intraductal papillary mucinous neoplasms (IPMN). No main ductal dilation, mass lesion, or abnormal enhancement. Recommend attention on follow-up. 4. Aortic Atherosclerosis (ICD10-I70.0). Coronary artery calcifications. Assessment for potential risk factor modification, dietary therapy or pharmacologic therapy may be warranted, if clinically indicated. These results will be called to the ordering clinician or representative by the Radiologist Assistant, and communication documented in the PACS or [employer]. Electronically Signed   By: Agustin Cree M.D.   On: 10/03/2022 14:13    ASSESSMENT & PLAN:   89 year old very pleasant gentleman with:  1) Hereditary hemochromatosis with compound heterozygous state for C282Y and H63D mutations 2) history of chronic active hepatitis and Iron overload on liver biopsy in 2011. Currently LFTs wnl. 3) Newly diagnosed    PLAN: -Discussed CT chest scan results from 10/11/2022 with the patient and his wife. Shows No evidence of metastatic disease in the chest, Mild lower lung and subpleural predominant reticular opacities, findings are suggestive of fibrotic interstitial lung disease. Correlate with patient's systems and consider pulmonary function tests and/or ILD protocol CT for further evaluation. -Discussed CT Liver/Abdominal results from 09/29/2022 with the patient and his wife. Shows Bilobar heterogeneously arterially enhancing hepatic masses demonstrate washout and suggestion of peripheral capsule, LI-RADS 5. Hypoattenuating focus in hepatic segment 8 measuring 2.5 x 2.0 cm demonstrates questionable ill-defined gradual enhancement. Finding could represent an additional focus of hepatocellular carcinoma versus a vascular lesion. LI-RADS 3. -Discussed the two future options: 1. If it is liver cancer, treatment will be combination of immunotherapy plus targeted therapy and local intravenation by radiologist. 2. Hospice and stay comfortable.  -Discussed AFP tumor marker results which shows elevated AFP at 1,097.0.  -Discussed the toxicities of immunotherapy and targeted therapy.  -Patient wants to think about the two options with family and will decide and call us back.   -patient called the clinic back and informed us that he is not inclined to pursue specific palliative treatmetn for suspected locally advanced HCC -will has an appointment to f/u with Lowella Bandy in the oncology palliative care clinic for further goals of care discussion and will need toa ddress enrollment in hospice  FOLLOW-UP: F/u with palliative care to define further goals of care and consider home hospice referral  The total time spent in the appointment was 21 minutes* .  All of the patient's questions were answered with apparent satisfaction. The patient knows to call the clinic with any problems, questions or concerns.   Wyvonnia Lora MD MS  AAHIVMS [facility] Hematology/Oncology Physician [facility]  .*Total Encounter Time as defined by the Centers for Medicare and Medicaid Services includes, in addition to the face-to-face time of a patient visit (documented in the note above) non-face-to-face time: obtaining and reviewing outside history, ordering and reviewing medications, tests or procedures, care coordination (communications with other health care professionals or caregivers) and documentation in the medical record.   I, Ok Edwards, am acting as a Neurosurgeon for Wyvonnia Lora, MD. .I have reviewed the above documentation for accuracy and completeness, and I agree with the above. Johney Maine MD

## 2022-10-20 ENCOUNTER — Other Ambulatory Visit: Payer: Self-pay | Admitting: *Deleted

## 2022-10-20 ENCOUNTER — Telehealth: Payer: Self-pay | Admitting: *Deleted

## 2022-10-20 DIAGNOSIS — C220 Liver cell carcinoma: Secondary | ICD-10-CM

## 2022-10-20 NOTE — Telephone Encounter (Signed)
Patient called and LVM -stated he and wife had thought it over and discussed options. He wants to "let the liver cancer run it's course and just take it as it comes". He said he was interested in a referral to [facility] as Dr. Candise Che discussed.  Dr. Candise Che informed of patient's message. Referral placed. Contacted patient and LVM with this information.

## 2022-10-23 ENCOUNTER — Encounter: Payer: Self-pay | Admitting: Hematology

## 2022-10-25 ENCOUNTER — Other Ambulatory Visit: Payer: Medicare Other

## 2022-10-26 ENCOUNTER — Other Ambulatory Visit: Payer: Self-pay

## 2022-10-26 DIAGNOSIS — C220 Liver cell carcinoma: Secondary | ICD-10-CM

## 2022-10-26 DIAGNOSIS — Z8739 Personal history of other diseases of the musculoskeletal system and connective tissue: Secondary | ICD-10-CM | POA: Diagnosis not present

## 2022-10-26 DIAGNOSIS — N400 Enlarged prostate without lower urinary tract symptoms: Secondary | ICD-10-CM | POA: Diagnosis not present

## 2022-10-26 DIAGNOSIS — G4733 Obstructive sleep apnea (adult) (pediatric): Secondary | ICD-10-CM | POA: Diagnosis not present

## 2022-10-26 DIAGNOSIS — E785 Hyperlipidemia, unspecified: Secondary | ICD-10-CM | POA: Diagnosis not present

## 2022-10-26 DIAGNOSIS — I10 Essential (primary) hypertension: Secondary | ICD-10-CM | POA: Diagnosis not present

## 2022-10-26 DIAGNOSIS — I4891 Unspecified atrial fibrillation: Secondary | ICD-10-CM | POA: Diagnosis not present

## 2022-10-26 DIAGNOSIS — Z9989 Dependence on other enabling machines and devices: Secondary | ICD-10-CM | POA: Diagnosis not present

## 2022-10-26 DIAGNOSIS — Z6825 Body mass index (BMI) 25.0-25.9, adult: Secondary | ICD-10-CM | POA: Diagnosis not present

## 2022-10-27 DIAGNOSIS — C220 Liver cell carcinoma: Secondary | ICD-10-CM | POA: Diagnosis not present

## 2022-10-27 DIAGNOSIS — I4891 Unspecified atrial fibrillation: Secondary | ICD-10-CM | POA: Diagnosis not present

## 2022-10-27 DIAGNOSIS — G4733 Obstructive sleep apnea (adult) (pediatric): Secondary | ICD-10-CM | POA: Diagnosis not present

## 2022-10-27 DIAGNOSIS — E785 Hyperlipidemia, unspecified: Secondary | ICD-10-CM | POA: Diagnosis not present

## 2022-10-27 DIAGNOSIS — I10 Essential (primary) hypertension: Secondary | ICD-10-CM | POA: Diagnosis not present

## 2022-10-31 DIAGNOSIS — C220 Liver cell carcinoma: Secondary | ICD-10-CM | POA: Diagnosis not present

## 2022-10-31 DIAGNOSIS — I4891 Unspecified atrial fibrillation: Secondary | ICD-10-CM | POA: Diagnosis not present

## 2022-10-31 DIAGNOSIS — E785 Hyperlipidemia, unspecified: Secondary | ICD-10-CM | POA: Diagnosis not present

## 2022-10-31 DIAGNOSIS — G4733 Obstructive sleep apnea (adult) (pediatric): Secondary | ICD-10-CM | POA: Diagnosis not present

## 2022-10-31 DIAGNOSIS — I10 Essential (primary) hypertension: Secondary | ICD-10-CM | POA: Diagnosis not present

## 2022-11-04 DIAGNOSIS — G4733 Obstructive sleep apnea (adult) (pediatric): Secondary | ICD-10-CM | POA: Diagnosis not present

## 2022-11-04 DIAGNOSIS — C220 Liver cell carcinoma: Secondary | ICD-10-CM | POA: Diagnosis not present

## 2022-11-04 DIAGNOSIS — Z8739 Personal history of other diseases of the musculoskeletal system and connective tissue: Secondary | ICD-10-CM | POA: Diagnosis not present

## 2022-11-04 DIAGNOSIS — Z6825 Body mass index (BMI) 25.0-25.9, adult: Secondary | ICD-10-CM | POA: Diagnosis not present

## 2022-11-04 DIAGNOSIS — Z9989 Dependence on other enabling machines and devices: Secondary | ICD-10-CM | POA: Diagnosis not present

## 2022-11-04 DIAGNOSIS — I4891 Unspecified atrial fibrillation: Secondary | ICD-10-CM | POA: Diagnosis not present

## 2022-11-04 DIAGNOSIS — E785 Hyperlipidemia, unspecified: Secondary | ICD-10-CM | POA: Diagnosis not present

## 2022-11-04 DIAGNOSIS — I10 Essential (primary) hypertension: Secondary | ICD-10-CM | POA: Diagnosis not present

## 2022-11-04 DIAGNOSIS — N400 Enlarged prostate without lower urinary tract symptoms: Secondary | ICD-10-CM | POA: Diagnosis not present

## 2022-11-17 DIAGNOSIS — I4891 Unspecified atrial fibrillation: Secondary | ICD-10-CM | POA: Diagnosis not present

## 2022-11-17 DIAGNOSIS — G4733 Obstructive sleep apnea (adult) (pediatric): Secondary | ICD-10-CM | POA: Diagnosis not present

## 2022-11-17 DIAGNOSIS — E785 Hyperlipidemia, unspecified: Secondary | ICD-10-CM | POA: Diagnosis not present

## 2022-11-17 DIAGNOSIS — C220 Liver cell carcinoma: Secondary | ICD-10-CM | POA: Diagnosis not present

## 2022-11-17 DIAGNOSIS — I10 Essential (primary) hypertension: Secondary | ICD-10-CM | POA: Diagnosis not present

## 2022-11-24 DIAGNOSIS — C220 Liver cell carcinoma: Secondary | ICD-10-CM | POA: Diagnosis not present

## 2022-11-24 DIAGNOSIS — E785 Hyperlipidemia, unspecified: Secondary | ICD-10-CM | POA: Diagnosis not present

## 2022-11-24 DIAGNOSIS — I4891 Unspecified atrial fibrillation: Secondary | ICD-10-CM | POA: Diagnosis not present

## 2022-11-24 DIAGNOSIS — I10 Essential (primary) hypertension: Secondary | ICD-10-CM | POA: Diagnosis not present

## 2022-11-24 DIAGNOSIS — G4733 Obstructive sleep apnea (adult) (pediatric): Secondary | ICD-10-CM | POA: Diagnosis not present

## 2022-11-27 ENCOUNTER — Ambulatory Visit: Payer: No Typology Code available for payment source | Admitting: Cardiovascular Disease

## 2022-12-01 DIAGNOSIS — I10 Essential (primary) hypertension: Secondary | ICD-10-CM | POA: Diagnosis not present

## 2022-12-01 DIAGNOSIS — C220 Liver cell carcinoma: Secondary | ICD-10-CM | POA: Diagnosis not present

## 2022-12-01 DIAGNOSIS — E785 Hyperlipidemia, unspecified: Secondary | ICD-10-CM | POA: Diagnosis not present

## 2022-12-01 DIAGNOSIS — G4733 Obstructive sleep apnea (adult) (pediatric): Secondary | ICD-10-CM | POA: Diagnosis not present

## 2022-12-01 DIAGNOSIS — I4891 Unspecified atrial fibrillation: Secondary | ICD-10-CM | POA: Diagnosis not present

## 2022-12-03 ENCOUNTER — Other Ambulatory Visit: Payer: Self-pay | Admitting: Nurse Practitioner

## 2022-12-03 IMAGING — CT CT HEAD W/O CM
4 series · 17 of 47 positions shown, 19 images · non-contrast
Comparison: None.

CLINICAL DATA: Head trauma.  Fall 3 weeks ago.



[Series 2: head wo · axial · 0.44mm/px · z∈[-83,+42]mm · 7 of 35 slices shown, 9 images]
[im 5/35  brain]
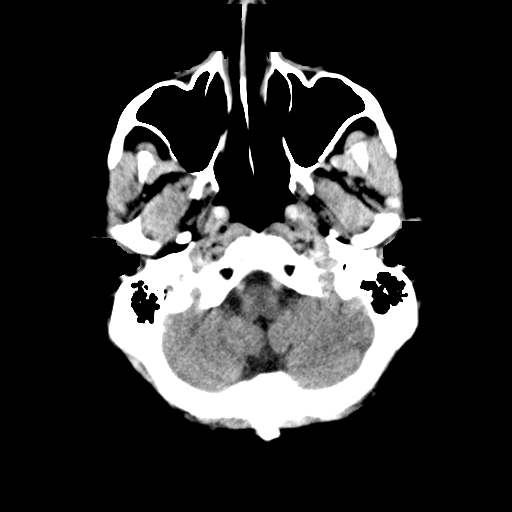
[im 5/35  bone]
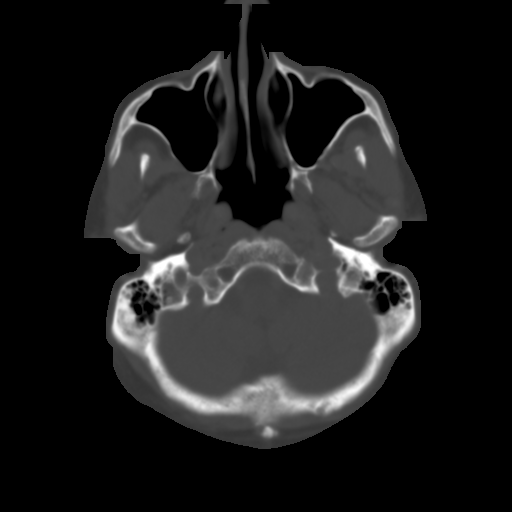
[im 9/35  brain]
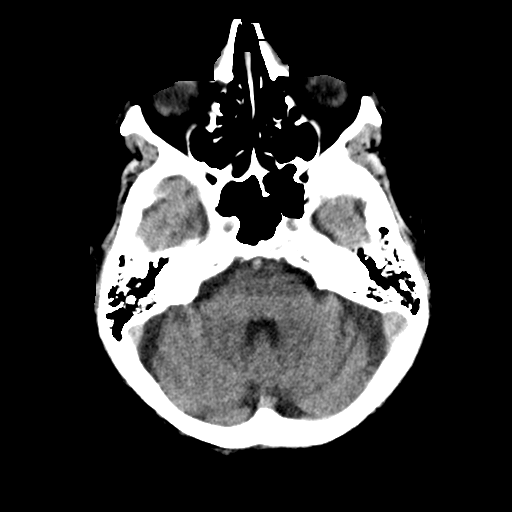
[im 13/35  brain]
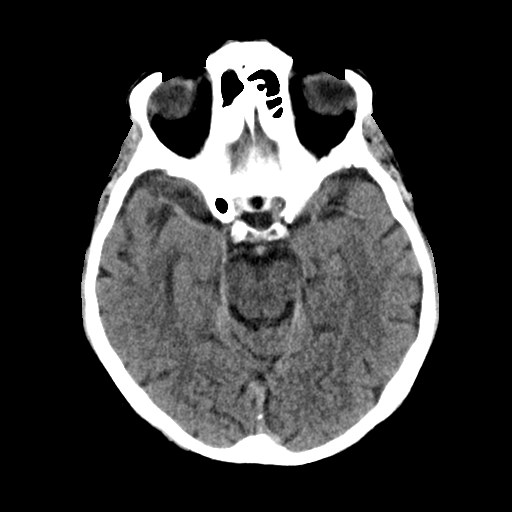
[im 18/35  brain]
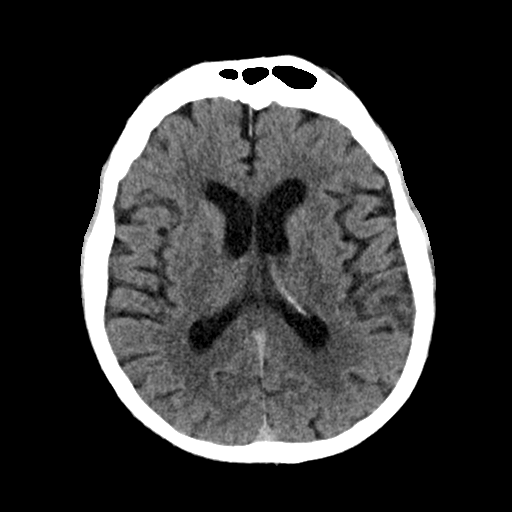
[im 22/35  brain]
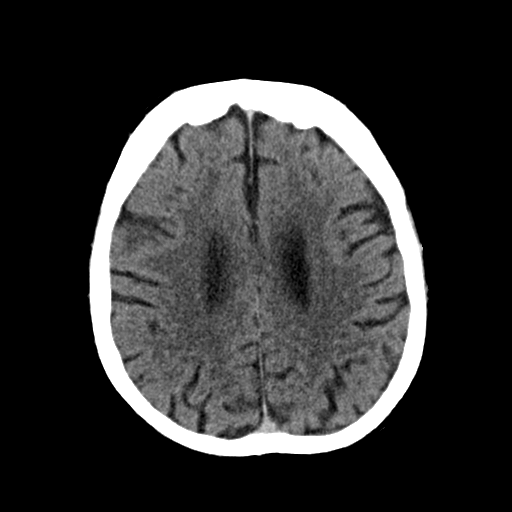
[im 22/35  bone]
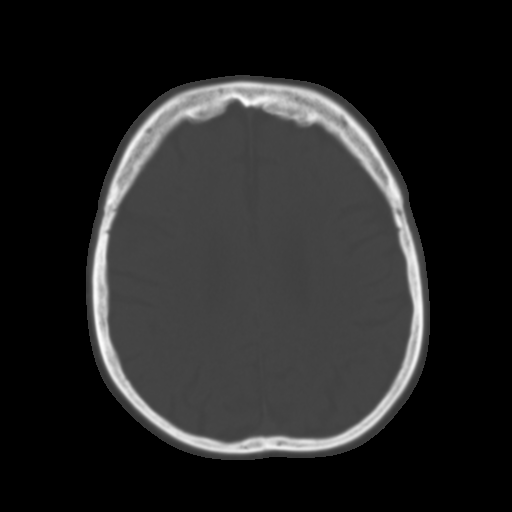
[im 26/35  brain]
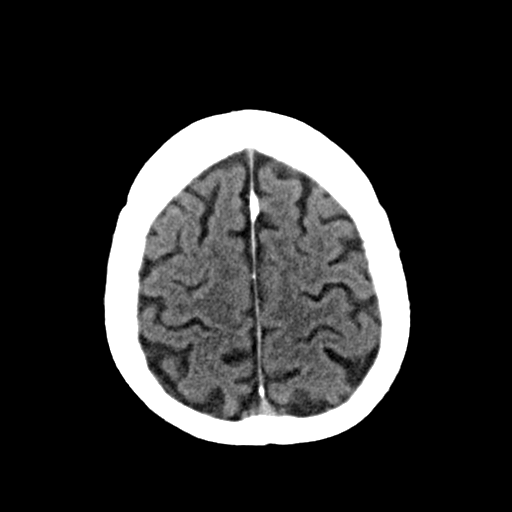
[im 30/35  brain]
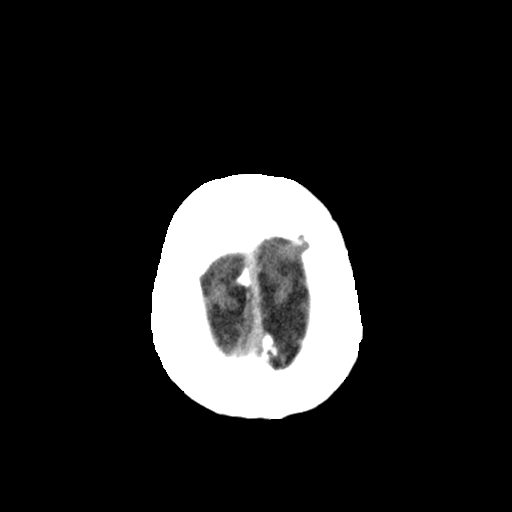

[Series 3: head bone · axial · 0.44mm/px · z∈[-87,-27]mm · 4 of 86 slices shown]
[im 9/86  bone]
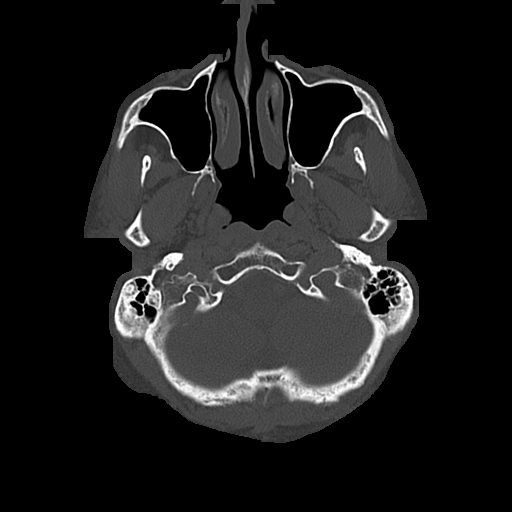
[im 18/86  bone]
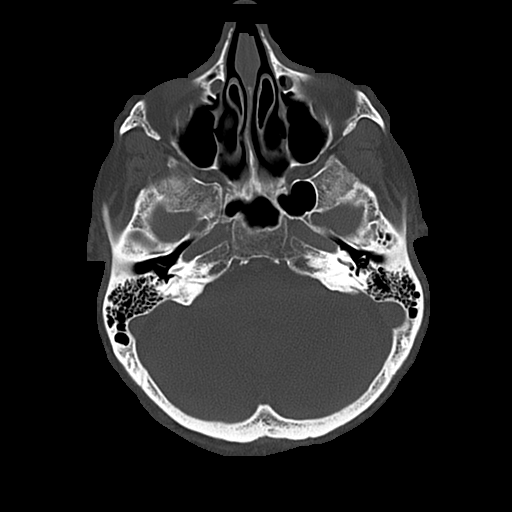
[im 26/86  bone]
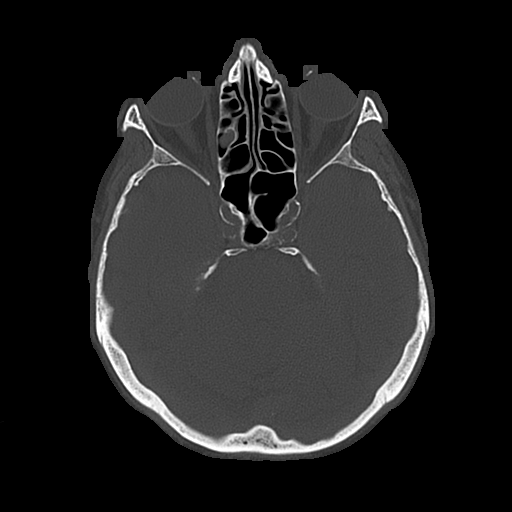
[im 39/86  bone]
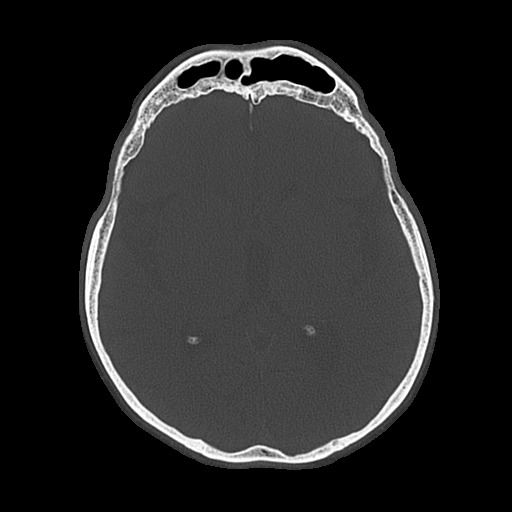

[Series 4: coronal soft · coronal · 0.37mm/px · 3 of 72 slices shown]
[im 24/72  brain]
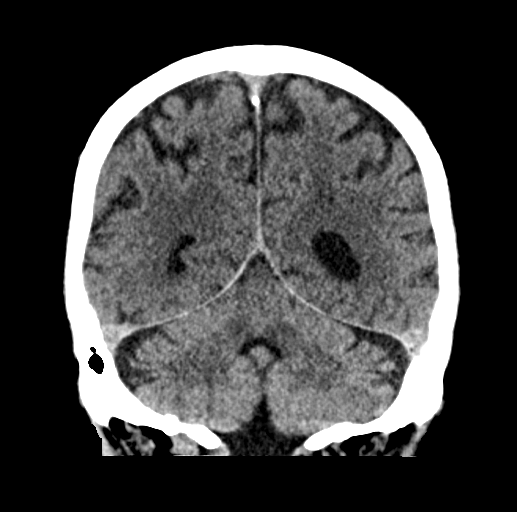
[im 32/72  brain]
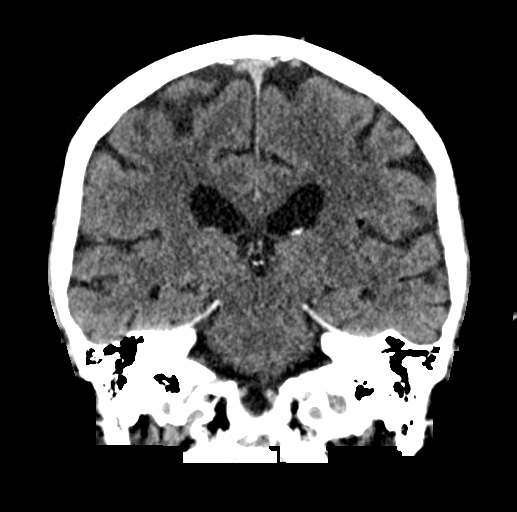
[im 40/72  brain]
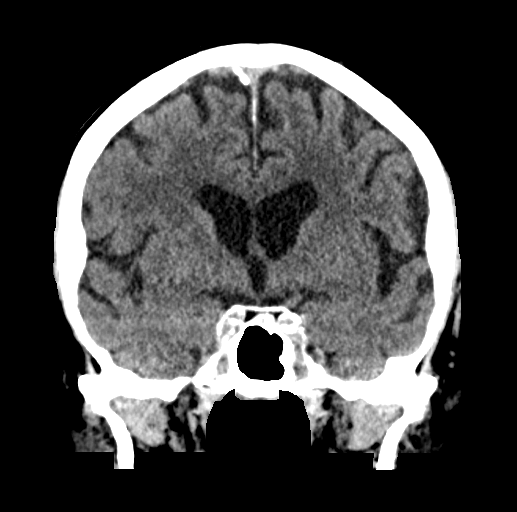

[Series 5: sagittal soft · sagittal · 0.37mm/px · 3 of 61 slices shown]
[im 21/61  brain]
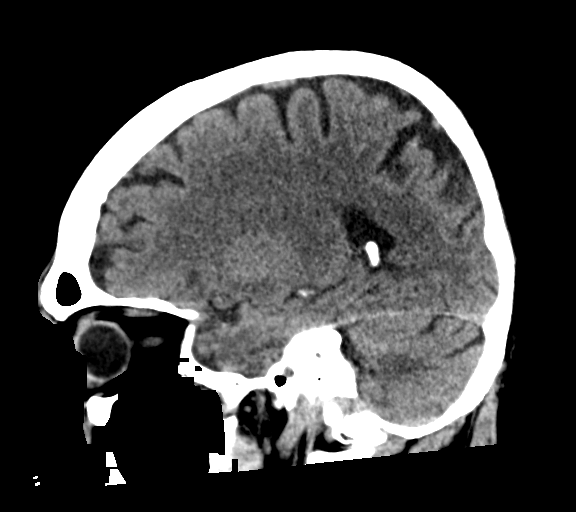
[im 31/61  brain]
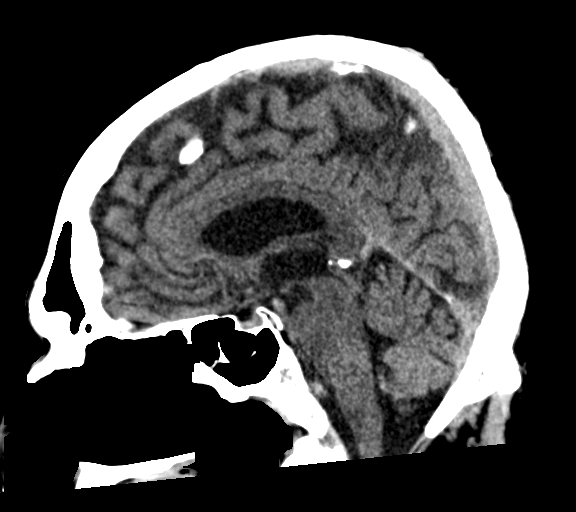
[im 41/61  brain]
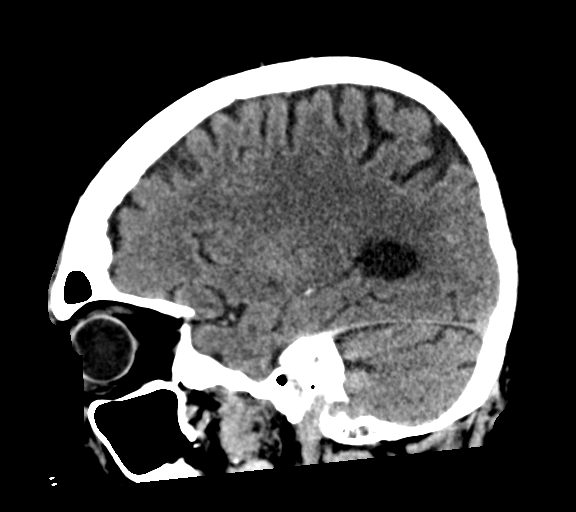

[17 of 47 positions shown; findings below may reference images not displayed]

FINDINGS: Brain: No evidence of acute infarction, hemorrhage, hydrocephalus,
extra-axial collection or mass lesion/mass effect. Diffuse
generalized parenchymal volume loss, within normal limits for
patient age. Hypodensities in the periventricular and subcortical
white matter, consistent with chronic small-vessel ischemic changes.

Vascular: No hyperdense vessel or unexpected calcification.

Skull: Normal. Negative for fracture or focal lesion.

Sinuses/Orbits: No acute finding. Opacification of a right ethmoid
air cell. The other paranasal sinuses and mastoid air cells are
clear.

Other: None.
IMPRESSION: No acute intracranial abnormality.  No calvarial fracture.

## 2022-12-04 DIAGNOSIS — E785 Hyperlipidemia, unspecified: Secondary | ICD-10-CM | POA: Diagnosis not present

## 2022-12-04 DIAGNOSIS — N400 Enlarged prostate without lower urinary tract symptoms: Secondary | ICD-10-CM | POA: Diagnosis not present

## 2022-12-04 DIAGNOSIS — Z6825 Body mass index (BMI) 25.0-25.9, adult: Secondary | ICD-10-CM | POA: Diagnosis not present

## 2022-12-04 DIAGNOSIS — Z9989 Dependence on other enabling machines and devices: Secondary | ICD-10-CM | POA: Diagnosis not present

## 2022-12-04 DIAGNOSIS — C220 Liver cell carcinoma: Secondary | ICD-10-CM | POA: Diagnosis not present

## 2022-12-04 DIAGNOSIS — Z8739 Personal history of other diseases of the musculoskeletal system and connective tissue: Secondary | ICD-10-CM | POA: Diagnosis not present

## 2022-12-04 DIAGNOSIS — I10 Essential (primary) hypertension: Secondary | ICD-10-CM | POA: Diagnosis not present

## 2022-12-04 DIAGNOSIS — I4891 Unspecified atrial fibrillation: Secondary | ICD-10-CM | POA: Diagnosis not present

## 2022-12-04 DIAGNOSIS — G4733 Obstructive sleep apnea (adult) (pediatric): Secondary | ICD-10-CM | POA: Diagnosis not present

## 2022-12-04 NOTE — Telephone Encounter (Signed)
Prescription refill request for Xarelto received.  Indication: Afib  Last office visit: 05/08/22 Andrew [name])  Weight: 84.6kg Age: 89 Scr: 1.01 (09/13/22)  CrCl: 58.55ml/min  Per dosing criteria, dose may need to be increased.   Per office visit note on 05/08/22:  If creatinine clearance is greater than 50 Andrew [name] will resume Xarelto at 20 mg particularly since Andrew [name] is in permanent atrial fibrillation.   Will forward to PharmD to advise.

## 2022-12-05 DIAGNOSIS — C787 Secondary malignant neoplasm of liver and intrahepatic bile duct: Secondary | ICD-10-CM | POA: Diagnosis not present

## 2022-12-08 DIAGNOSIS — I4891 Unspecified atrial fibrillation: Secondary | ICD-10-CM | POA: Diagnosis not present

## 2022-12-08 DIAGNOSIS — G4733 Obstructive sleep apnea (adult) (pediatric): Secondary | ICD-10-CM | POA: Diagnosis not present

## 2022-12-08 DIAGNOSIS — I10 Essential (primary) hypertension: Secondary | ICD-10-CM | POA: Diagnosis not present

## 2022-12-08 DIAGNOSIS — C220 Liver cell carcinoma: Secondary | ICD-10-CM | POA: Diagnosis not present

## 2022-12-08 DIAGNOSIS — E785 Hyperlipidemia, unspecified: Secondary | ICD-10-CM | POA: Diagnosis not present

## 2022-12-11 ENCOUNTER — Telehealth: Payer: Self-pay | Admitting: Cardiovascular Disease

## 2022-12-11 ENCOUNTER — Other Ambulatory Visit: Payer: Self-pay

## 2022-12-11 DIAGNOSIS — G4733 Obstructive sleep apnea (adult) (pediatric): Secondary | ICD-10-CM | POA: Diagnosis not present

## 2022-12-11 DIAGNOSIS — E785 Hyperlipidemia, unspecified: Secondary | ICD-10-CM | POA: Diagnosis not present

## 2022-12-11 DIAGNOSIS — C220 Liver cell carcinoma: Secondary | ICD-10-CM | POA: Diagnosis not present

## 2022-12-11 DIAGNOSIS — I4891 Unspecified atrial fibrillation: Secondary | ICD-10-CM | POA: Diagnosis not present

## 2022-12-11 DIAGNOSIS — I10 Essential (primary) hypertension: Secondary | ICD-10-CM | POA: Diagnosis not present

## 2022-12-11 MED ORDER — RIVAROXABAN 20 MG PO TABS: 20.0000 mg | ORAL_TABLET | Freq: Every day | ORAL | 3 refills | Status: AC

## 2022-12-11 NOTE — Telephone Encounter (Signed)
*  STAT* If patient is at the pharmacy, call can be transferred to refill team.   1. Which medications need to be refilled? (please list name of each medication and dose if known) rivaroxaban (XARELTO) 20 MG TABS tablet   2. Which pharmacy/location (including street and city if local pharmacy) is medication to be sent to?  [id number] - [place], [place] - [address]    3. Do they need a 30 day or 90 day supply? 90

## 2022-12-11 NOTE — Telephone Encounter (Signed)
Prescription refill request for Xarelto received.  Indication:afib Last office visit:12/23 Weight:84.6  kg Age:89 Scr:1.01  4/24 CrCl:58.17  ml/min  Prescription refilled

## 2022-12-15 DIAGNOSIS — C220 Liver cell carcinoma: Secondary | ICD-10-CM | POA: Diagnosis not present

## 2022-12-15 DIAGNOSIS — E785 Hyperlipidemia, unspecified: Secondary | ICD-10-CM | POA: Diagnosis not present

## 2022-12-15 DIAGNOSIS — I10 Essential (primary) hypertension: Secondary | ICD-10-CM | POA: Diagnosis not present

## 2022-12-15 DIAGNOSIS — G4733 Obstructive sleep apnea (adult) (pediatric): Secondary | ICD-10-CM | POA: Diagnosis not present

## 2022-12-15 DIAGNOSIS — R7303 Prediabetes: Secondary | ICD-10-CM | POA: Diagnosis not present

## 2022-12-15 DIAGNOSIS — I4891 Unspecified atrial fibrillation: Secondary | ICD-10-CM | POA: Diagnosis not present

## 2022-12-20 DIAGNOSIS — N401 Enlarged prostate with lower urinary tract symptoms: Secondary | ICD-10-CM | POA: Diagnosis not present

## 2022-12-20 DIAGNOSIS — F5104 Psychophysiologic insomnia: Secondary | ICD-10-CM | POA: Diagnosis not present

## 2022-12-20 DIAGNOSIS — R310 Gross hematuria: Secondary | ICD-10-CM | POA: Diagnosis not present

## 2022-12-20 DIAGNOSIS — Z0000 Encounter for general adult medical examination without abnormal findings: Secondary | ICD-10-CM | POA: Diagnosis not present

## 2022-12-20 DIAGNOSIS — Z86010 Personal history of colonic polyps: Secondary | ICD-10-CM | POA: Diagnosis not present

## 2022-12-20 DIAGNOSIS — E876 Hypokalemia: Secondary | ICD-10-CM | POA: Diagnosis not present

## 2022-12-20 DIAGNOSIS — I700 Atherosclerosis of aorta: Secondary | ICD-10-CM | POA: Diagnosis not present

## 2022-12-20 DIAGNOSIS — I10 Essential (primary) hypertension: Secondary | ICD-10-CM | POA: Diagnosis not present

## 2022-12-20 DIAGNOSIS — E118 Type 2 diabetes mellitus with unspecified complications: Secondary | ICD-10-CM | POA: Diagnosis not present

## 2022-12-20 DIAGNOSIS — I4891 Unspecified atrial fibrillation: Secondary | ICD-10-CM | POA: Diagnosis not present

## 2022-12-20 DIAGNOSIS — D6869 Other thrombophilia: Secondary | ICD-10-CM | POA: Diagnosis not present

## 2022-12-22 DIAGNOSIS — E785 Hyperlipidemia, unspecified: Secondary | ICD-10-CM | POA: Diagnosis not present

## 2022-12-22 DIAGNOSIS — G4733 Obstructive sleep apnea (adult) (pediatric): Secondary | ICD-10-CM | POA: Diagnosis not present

## 2022-12-22 DIAGNOSIS — I10 Essential (primary) hypertension: Secondary | ICD-10-CM | POA: Diagnosis not present

## 2022-12-22 DIAGNOSIS — I4891 Unspecified atrial fibrillation: Secondary | ICD-10-CM | POA: Diagnosis not present

## 2022-12-22 DIAGNOSIS — C220 Liver cell carcinoma: Secondary | ICD-10-CM | POA: Diagnosis not present

## 2022-12-29 DIAGNOSIS — I10 Essential (primary) hypertension: Secondary | ICD-10-CM | POA: Diagnosis not present

## 2022-12-29 DIAGNOSIS — N401 Enlarged prostate with lower urinary tract symptoms: Secondary | ICD-10-CM | POA: Diagnosis not present

## 2022-12-29 DIAGNOSIS — C220 Liver cell carcinoma: Secondary | ICD-10-CM | POA: Diagnosis not present

## 2022-12-29 DIAGNOSIS — E785 Hyperlipidemia, unspecified: Secondary | ICD-10-CM | POA: Diagnosis not present

## 2022-12-29 DIAGNOSIS — I4891 Unspecified atrial fibrillation: Secondary | ICD-10-CM | POA: Diagnosis not present

## 2022-12-29 DIAGNOSIS — G4733 Obstructive sleep apnea (adult) (pediatric): Secondary | ICD-10-CM | POA: Diagnosis not present

## 2022-12-29 DIAGNOSIS — R3914 Feeling of incomplete bladder emptying: Secondary | ICD-10-CM | POA: Diagnosis not present

## 2023-01-04 DIAGNOSIS — C220 Liver cell carcinoma: Secondary | ICD-10-CM | POA: Diagnosis not present

## 2023-01-04 DIAGNOSIS — I10 Essential (primary) hypertension: Secondary | ICD-10-CM | POA: Diagnosis not present

## 2023-01-04 DIAGNOSIS — I4891 Unspecified atrial fibrillation: Secondary | ICD-10-CM | POA: Diagnosis not present

## 2023-01-04 DIAGNOSIS — E785 Hyperlipidemia, unspecified: Secondary | ICD-10-CM | POA: Diagnosis not present

## 2023-01-04 DIAGNOSIS — Z9989 Dependence on other enabling machines and devices: Secondary | ICD-10-CM | POA: Diagnosis not present

## 2023-01-04 DIAGNOSIS — N400 Enlarged prostate without lower urinary tract symptoms: Secondary | ICD-10-CM | POA: Diagnosis not present

## 2023-01-04 DIAGNOSIS — Z8739 Personal history of other diseases of the musculoskeletal system and connective tissue: Secondary | ICD-10-CM | POA: Diagnosis not present

## 2023-01-04 DIAGNOSIS — Z6825 Body mass index (BMI) 25.0-25.9, adult: Secondary | ICD-10-CM | POA: Diagnosis not present

## 2023-01-04 DIAGNOSIS — G4733 Obstructive sleep apnea (adult) (pediatric): Secondary | ICD-10-CM | POA: Diagnosis not present

## 2023-01-05 DIAGNOSIS — E785 Hyperlipidemia, unspecified: Secondary | ICD-10-CM | POA: Diagnosis not present

## 2023-01-05 DIAGNOSIS — I10 Essential (primary) hypertension: Secondary | ICD-10-CM | POA: Diagnosis not present

## 2023-01-05 DIAGNOSIS — C220 Liver cell carcinoma: Secondary | ICD-10-CM | POA: Diagnosis not present

## 2023-01-05 DIAGNOSIS — I4891 Unspecified atrial fibrillation: Secondary | ICD-10-CM | POA: Diagnosis not present

## 2023-01-05 DIAGNOSIS — G4733 Obstructive sleep apnea (adult) (pediatric): Secondary | ICD-10-CM | POA: Diagnosis not present

## 2023-01-08 DIAGNOSIS — E785 Hyperlipidemia, unspecified: Secondary | ICD-10-CM | POA: Diagnosis not present

## 2023-01-08 DIAGNOSIS — I4891 Unspecified atrial fibrillation: Secondary | ICD-10-CM | POA: Diagnosis not present

## 2023-01-08 DIAGNOSIS — I10 Essential (primary) hypertension: Secondary | ICD-10-CM | POA: Diagnosis not present

## 2023-01-08 DIAGNOSIS — C220 Liver cell carcinoma: Secondary | ICD-10-CM | POA: Diagnosis not present

## 2023-01-08 DIAGNOSIS — G4733 Obstructive sleep apnea (adult) (pediatric): Secondary | ICD-10-CM | POA: Diagnosis not present

## 2023-01-10 DIAGNOSIS — I10 Essential (primary) hypertension: Secondary | ICD-10-CM | POA: Diagnosis not present

## 2023-01-12 DIAGNOSIS — G4733 Obstructive sleep apnea (adult) (pediatric): Secondary | ICD-10-CM | POA: Diagnosis not present

## 2023-01-12 DIAGNOSIS — C220 Liver cell carcinoma: Secondary | ICD-10-CM | POA: Diagnosis not present

## 2023-01-12 DIAGNOSIS — I10 Essential (primary) hypertension: Secondary | ICD-10-CM | POA: Diagnosis not present

## 2023-01-12 DIAGNOSIS — I4891 Unspecified atrial fibrillation: Secondary | ICD-10-CM | POA: Diagnosis not present

## 2023-01-12 DIAGNOSIS — E785 Hyperlipidemia, unspecified: Secondary | ICD-10-CM | POA: Diagnosis not present

## 2023-01-19 DIAGNOSIS — I4891 Unspecified atrial fibrillation: Secondary | ICD-10-CM | POA: Diagnosis not present

## 2023-01-19 DIAGNOSIS — I10 Essential (primary) hypertension: Secondary | ICD-10-CM | POA: Diagnosis not present

## 2023-01-19 DIAGNOSIS — G4733 Obstructive sleep apnea (adult) (pediatric): Secondary | ICD-10-CM | POA: Diagnosis not present

## 2023-01-19 DIAGNOSIS — E785 Hyperlipidemia, unspecified: Secondary | ICD-10-CM | POA: Diagnosis not present

## 2023-01-19 DIAGNOSIS — C220 Liver cell carcinoma: Secondary | ICD-10-CM | POA: Diagnosis not present

## 2023-01-23 ENCOUNTER — Other Ambulatory Visit: Payer: Self-pay | Admitting: Nurse Practitioner

## 2023-01-26 DIAGNOSIS — E785 Hyperlipidemia, unspecified: Secondary | ICD-10-CM | POA: Diagnosis not present

## 2023-01-26 DIAGNOSIS — C220 Liver cell carcinoma: Secondary | ICD-10-CM | POA: Diagnosis not present

## 2023-01-26 DIAGNOSIS — G4733 Obstructive sleep apnea (adult) (pediatric): Secondary | ICD-10-CM | POA: Diagnosis not present

## 2023-01-26 DIAGNOSIS — I10 Essential (primary) hypertension: Secondary | ICD-10-CM | POA: Diagnosis not present

## 2023-01-26 DIAGNOSIS — I4891 Unspecified atrial fibrillation: Secondary | ICD-10-CM | POA: Diagnosis not present

## 2023-02-01 DIAGNOSIS — E785 Hyperlipidemia, unspecified: Secondary | ICD-10-CM | POA: Diagnosis not present

## 2023-02-01 DIAGNOSIS — I10 Essential (primary) hypertension: Secondary | ICD-10-CM | POA: Diagnosis not present

## 2023-02-01 DIAGNOSIS — I4891 Unspecified atrial fibrillation: Secondary | ICD-10-CM | POA: Diagnosis not present

## 2023-02-01 DIAGNOSIS — G4733 Obstructive sleep apnea (adult) (pediatric): Secondary | ICD-10-CM | POA: Diagnosis not present

## 2023-02-01 DIAGNOSIS — C220 Liver cell carcinoma: Secondary | ICD-10-CM | POA: Diagnosis not present

## 2023-02-04 DIAGNOSIS — G4733 Obstructive sleep apnea (adult) (pediatric): Secondary | ICD-10-CM | POA: Diagnosis not present

## 2023-02-04 DIAGNOSIS — I4891 Unspecified atrial fibrillation: Secondary | ICD-10-CM | POA: Diagnosis not present

## 2023-02-04 DIAGNOSIS — Z6825 Body mass index (BMI) 25.0-25.9, adult: Secondary | ICD-10-CM | POA: Diagnosis not present

## 2023-02-04 DIAGNOSIS — C220 Liver cell carcinoma: Secondary | ICD-10-CM | POA: Diagnosis not present

## 2023-02-04 DIAGNOSIS — N400 Enlarged prostate without lower urinary tract symptoms: Secondary | ICD-10-CM | POA: Diagnosis not present

## 2023-02-04 DIAGNOSIS — Z8739 Personal history of other diseases of the musculoskeletal system and connective tissue: Secondary | ICD-10-CM | POA: Diagnosis not present

## 2023-02-04 DIAGNOSIS — I10 Essential (primary) hypertension: Secondary | ICD-10-CM | POA: Diagnosis not present

## 2023-02-04 DIAGNOSIS — E785 Hyperlipidemia, unspecified: Secondary | ICD-10-CM | POA: Diagnosis not present

## 2023-02-04 DIAGNOSIS — Z9989 Dependence on other enabling machines and devices: Secondary | ICD-10-CM | POA: Diagnosis not present

## 2023-02-05 DIAGNOSIS — E785 Hyperlipidemia, unspecified: Secondary | ICD-10-CM | POA: Diagnosis not present

## 2023-02-05 DIAGNOSIS — I10 Essential (primary) hypertension: Secondary | ICD-10-CM | POA: Diagnosis not present

## 2023-02-05 DIAGNOSIS — I4891 Unspecified atrial fibrillation: Secondary | ICD-10-CM | POA: Diagnosis not present

## 2023-02-05 DIAGNOSIS — G4733 Obstructive sleep apnea (adult) (pediatric): Secondary | ICD-10-CM | POA: Diagnosis not present

## 2023-02-05 DIAGNOSIS — C220 Liver cell carcinoma: Secondary | ICD-10-CM | POA: Diagnosis not present

## 2023-02-06 DIAGNOSIS — E785 Hyperlipidemia, unspecified: Secondary | ICD-10-CM | POA: Diagnosis not present

## 2023-02-06 DIAGNOSIS — G4733 Obstructive sleep apnea (adult) (pediatric): Secondary | ICD-10-CM | POA: Diagnosis not present

## 2023-02-06 DIAGNOSIS — I10 Essential (primary) hypertension: Secondary | ICD-10-CM | POA: Diagnosis not present

## 2023-02-06 DIAGNOSIS — I4891 Unspecified atrial fibrillation: Secondary | ICD-10-CM | POA: Diagnosis not present

## 2023-02-06 DIAGNOSIS — C220 Liver cell carcinoma: Secondary | ICD-10-CM | POA: Diagnosis not present

## 2023-02-07 DIAGNOSIS — E785 Hyperlipidemia, unspecified: Secondary | ICD-10-CM | POA: Diagnosis not present

## 2023-02-07 DIAGNOSIS — C220 Liver cell carcinoma: Secondary | ICD-10-CM | POA: Diagnosis not present

## 2023-02-07 DIAGNOSIS — G4733 Obstructive sleep apnea (adult) (pediatric): Secondary | ICD-10-CM | POA: Diagnosis not present

## 2023-02-07 DIAGNOSIS — I4891 Unspecified atrial fibrillation: Secondary | ICD-10-CM | POA: Diagnosis not present

## 2023-02-07 DIAGNOSIS — I10 Essential (primary) hypertension: Secondary | ICD-10-CM | POA: Diagnosis not present

## 2023-02-08 DIAGNOSIS — G4733 Obstructive sleep apnea (adult) (pediatric): Secondary | ICD-10-CM | POA: Diagnosis not present

## 2023-02-08 DIAGNOSIS — C220 Liver cell carcinoma: Secondary | ICD-10-CM | POA: Diagnosis not present

## 2023-02-08 DIAGNOSIS — I10 Essential (primary) hypertension: Secondary | ICD-10-CM | POA: Diagnosis not present

## 2023-02-08 DIAGNOSIS — E785 Hyperlipidemia, unspecified: Secondary | ICD-10-CM | POA: Diagnosis not present

## 2023-02-08 DIAGNOSIS — I4891 Unspecified atrial fibrillation: Secondary | ICD-10-CM | POA: Diagnosis not present

## 2023-02-09 DIAGNOSIS — G4733 Obstructive sleep apnea (adult) (pediatric): Secondary | ICD-10-CM | POA: Diagnosis not present

## 2023-02-09 DIAGNOSIS — C220 Liver cell carcinoma: Secondary | ICD-10-CM | POA: Diagnosis not present

## 2023-02-09 DIAGNOSIS — I10 Essential (primary) hypertension: Secondary | ICD-10-CM | POA: Diagnosis not present

## 2023-02-09 DIAGNOSIS — I4891 Unspecified atrial fibrillation: Secondary | ICD-10-CM | POA: Diagnosis not present

## 2023-02-09 DIAGNOSIS — E785 Hyperlipidemia, unspecified: Secondary | ICD-10-CM | POA: Diagnosis not present

## 2023-02-10 DIAGNOSIS — G4733 Obstructive sleep apnea (adult) (pediatric): Secondary | ICD-10-CM | POA: Diagnosis not present

## 2023-02-10 DIAGNOSIS — E785 Hyperlipidemia, unspecified: Secondary | ICD-10-CM | POA: Diagnosis not present

## 2023-02-10 DIAGNOSIS — I10 Essential (primary) hypertension: Secondary | ICD-10-CM | POA: Diagnosis not present

## 2023-02-10 DIAGNOSIS — I4891 Unspecified atrial fibrillation: Secondary | ICD-10-CM | POA: Diagnosis not present

## 2023-02-10 DIAGNOSIS — C220 Liver cell carcinoma: Secondary | ICD-10-CM | POA: Diagnosis not present

## 2023-02-11 DIAGNOSIS — I10 Essential (primary) hypertension: Secondary | ICD-10-CM | POA: Diagnosis not present

## 2023-02-11 DIAGNOSIS — I4891 Unspecified atrial fibrillation: Secondary | ICD-10-CM | POA: Diagnosis not present

## 2023-02-11 DIAGNOSIS — E785 Hyperlipidemia, unspecified: Secondary | ICD-10-CM | POA: Diagnosis not present

## 2023-02-11 DIAGNOSIS — G4733 Obstructive sleep apnea (adult) (pediatric): Secondary | ICD-10-CM | POA: Diagnosis not present

## 2023-02-11 DIAGNOSIS — C220 Liver cell carcinoma: Secondary | ICD-10-CM | POA: Diagnosis not present

## 2023-02-12 DIAGNOSIS — E785 Hyperlipidemia, unspecified: Secondary | ICD-10-CM | POA: Diagnosis not present

## 2023-02-12 DIAGNOSIS — C220 Liver cell carcinoma: Secondary | ICD-10-CM | POA: Diagnosis not present

## 2023-02-12 DIAGNOSIS — I10 Essential (primary) hypertension: Secondary | ICD-10-CM | POA: Diagnosis not present

## 2023-02-12 DIAGNOSIS — G4733 Obstructive sleep apnea (adult) (pediatric): Secondary | ICD-10-CM | POA: Diagnosis not present

## 2023-02-12 DIAGNOSIS — I4891 Unspecified atrial fibrillation: Secondary | ICD-10-CM | POA: Diagnosis not present

## 2023-02-13 DIAGNOSIS — C220 Liver cell carcinoma: Secondary | ICD-10-CM | POA: Diagnosis not present

## 2023-02-13 DIAGNOSIS — I4891 Unspecified atrial fibrillation: Secondary | ICD-10-CM | POA: Diagnosis not present

## 2023-02-13 DIAGNOSIS — I10 Essential (primary) hypertension: Secondary | ICD-10-CM | POA: Diagnosis not present

## 2023-02-13 DIAGNOSIS — E785 Hyperlipidemia, unspecified: Secondary | ICD-10-CM | POA: Diagnosis not present

## 2023-02-13 DIAGNOSIS — G4733 Obstructive sleep apnea (adult) (pediatric): Secondary | ICD-10-CM | POA: Diagnosis not present

## 2023-02-14 DIAGNOSIS — I4891 Unspecified atrial fibrillation: Secondary | ICD-10-CM | POA: Diagnosis not present

## 2023-02-14 DIAGNOSIS — E785 Hyperlipidemia, unspecified: Secondary | ICD-10-CM | POA: Diagnosis not present

## 2023-02-14 DIAGNOSIS — G4733 Obstructive sleep apnea (adult) (pediatric): Secondary | ICD-10-CM | POA: Diagnosis not present

## 2023-02-14 DIAGNOSIS — C220 Liver cell carcinoma: Secondary | ICD-10-CM | POA: Diagnosis not present

## 2023-02-14 DIAGNOSIS — I10 Essential (primary) hypertension: Secondary | ICD-10-CM | POA: Diagnosis not present

## 2023-02-15 DIAGNOSIS — I4891 Unspecified atrial fibrillation: Secondary | ICD-10-CM | POA: Diagnosis not present

## 2023-02-15 DIAGNOSIS — E785 Hyperlipidemia, unspecified: Secondary | ICD-10-CM | POA: Diagnosis not present

## 2023-02-15 DIAGNOSIS — G4733 Obstructive sleep apnea (adult) (pediatric): Secondary | ICD-10-CM | POA: Diagnosis not present

## 2023-02-15 DIAGNOSIS — I10 Essential (primary) hypertension: Secondary | ICD-10-CM | POA: Diagnosis not present

## 2023-02-15 DIAGNOSIS — C220 Liver cell carcinoma: Secondary | ICD-10-CM | POA: Diagnosis not present

## 2023-02-16 DIAGNOSIS — C220 Liver cell carcinoma: Secondary | ICD-10-CM | POA: Diagnosis not present

## 2023-02-16 DIAGNOSIS — E785 Hyperlipidemia, unspecified: Secondary | ICD-10-CM | POA: Diagnosis not present

## 2023-02-16 DIAGNOSIS — I10 Essential (primary) hypertension: Secondary | ICD-10-CM | POA: Diagnosis not present

## 2023-02-16 DIAGNOSIS — G4733 Obstructive sleep apnea (adult) (pediatric): Secondary | ICD-10-CM | POA: Diagnosis not present

## 2023-02-16 DIAGNOSIS — I4891 Unspecified atrial fibrillation: Secondary | ICD-10-CM | POA: Diagnosis not present

## 2023-02-17 DIAGNOSIS — C220 Liver cell carcinoma: Secondary | ICD-10-CM | POA: Diagnosis not present

## 2023-02-17 DIAGNOSIS — I10 Essential (primary) hypertension: Secondary | ICD-10-CM | POA: Diagnosis not present

## 2023-02-17 DIAGNOSIS — G4733 Obstructive sleep apnea (adult) (pediatric): Secondary | ICD-10-CM | POA: Diagnosis not present

## 2023-02-17 DIAGNOSIS — E785 Hyperlipidemia, unspecified: Secondary | ICD-10-CM | POA: Diagnosis not present

## 2023-02-17 DIAGNOSIS — I4891 Unspecified atrial fibrillation: Secondary | ICD-10-CM | POA: Diagnosis not present

## 2023-02-18 DIAGNOSIS — G4733 Obstructive sleep apnea (adult) (pediatric): Secondary | ICD-10-CM | POA: Diagnosis not present

## 2023-02-18 DIAGNOSIS — I4891 Unspecified atrial fibrillation: Secondary | ICD-10-CM | POA: Diagnosis not present

## 2023-02-18 DIAGNOSIS — E785 Hyperlipidemia, unspecified: Secondary | ICD-10-CM | POA: Diagnosis not present

## 2023-02-18 DIAGNOSIS — I10 Essential (primary) hypertension: Secondary | ICD-10-CM | POA: Diagnosis not present

## 2023-02-18 DIAGNOSIS — C220 Liver cell carcinoma: Secondary | ICD-10-CM | POA: Diagnosis not present

## 2023-02-19 DIAGNOSIS — C220 Liver cell carcinoma: Secondary | ICD-10-CM | POA: Diagnosis not present

## 2023-02-19 DIAGNOSIS — G4733 Obstructive sleep apnea (adult) (pediatric): Secondary | ICD-10-CM | POA: Diagnosis not present

## 2023-02-19 DIAGNOSIS — I10 Essential (primary) hypertension: Secondary | ICD-10-CM | POA: Diagnosis not present

## 2023-02-19 DIAGNOSIS — I4891 Unspecified atrial fibrillation: Secondary | ICD-10-CM | POA: Diagnosis not present

## 2023-02-19 DIAGNOSIS — E785 Hyperlipidemia, unspecified: Secondary | ICD-10-CM | POA: Diagnosis not present

## 2023-02-20 DIAGNOSIS — G4733 Obstructive sleep apnea (adult) (pediatric): Secondary | ICD-10-CM | POA: Diagnosis not present

## 2023-02-20 DIAGNOSIS — I10 Essential (primary) hypertension: Secondary | ICD-10-CM | POA: Diagnosis not present

## 2023-02-20 DIAGNOSIS — C220 Liver cell carcinoma: Secondary | ICD-10-CM | POA: Diagnosis not present

## 2023-02-20 DIAGNOSIS — Z85828 Personal history of other malignant neoplasm of skin: Secondary | ICD-10-CM | POA: Diagnosis not present

## 2023-02-20 DIAGNOSIS — L570 Actinic keratosis: Secondary | ICD-10-CM | POA: Diagnosis not present

## 2023-02-20 DIAGNOSIS — L821 Other seborrheic keratosis: Secondary | ICD-10-CM | POA: Diagnosis not present

## 2023-02-20 DIAGNOSIS — I4891 Unspecified atrial fibrillation: Secondary | ICD-10-CM | POA: Diagnosis not present

## 2023-02-20 DIAGNOSIS — E785 Hyperlipidemia, unspecified: Secondary | ICD-10-CM | POA: Diagnosis not present

## 2023-02-20 DIAGNOSIS — S70361A Insect bite (nonvenomous), right thigh, initial encounter: Secondary | ICD-10-CM | POA: Diagnosis not present

## 2023-02-20 DIAGNOSIS — D485 Neoplasm of uncertain behavior of skin: Secondary | ICD-10-CM | POA: Diagnosis not present

## 2023-02-21 DIAGNOSIS — H26492 Other secondary cataract, left eye: Secondary | ICD-10-CM | POA: Diagnosis not present

## 2023-02-21 DIAGNOSIS — I10 Essential (primary) hypertension: Secondary | ICD-10-CM | POA: Diagnosis not present

## 2023-02-21 DIAGNOSIS — E785 Hyperlipidemia, unspecified: Secondary | ICD-10-CM | POA: Diagnosis not present

## 2023-02-21 DIAGNOSIS — G4733 Obstructive sleep apnea (adult) (pediatric): Secondary | ICD-10-CM | POA: Diagnosis not present

## 2023-02-21 DIAGNOSIS — I4891 Unspecified atrial fibrillation: Secondary | ICD-10-CM | POA: Diagnosis not present

## 2023-02-21 DIAGNOSIS — C220 Liver cell carcinoma: Secondary | ICD-10-CM | POA: Diagnosis not present

## 2023-02-21 DIAGNOSIS — Z961 Presence of intraocular lens: Secondary | ICD-10-CM | POA: Diagnosis not present

## 2023-02-22 DIAGNOSIS — G4733 Obstructive sleep apnea (adult) (pediatric): Secondary | ICD-10-CM | POA: Diagnosis not present

## 2023-02-22 DIAGNOSIS — I4891 Unspecified atrial fibrillation: Secondary | ICD-10-CM | POA: Diagnosis not present

## 2023-02-22 DIAGNOSIS — E785 Hyperlipidemia, unspecified: Secondary | ICD-10-CM | POA: Diagnosis not present

## 2023-02-22 DIAGNOSIS — I10 Essential (primary) hypertension: Secondary | ICD-10-CM | POA: Diagnosis not present

## 2023-02-22 DIAGNOSIS — C220 Liver cell carcinoma: Secondary | ICD-10-CM | POA: Diagnosis not present

## 2023-02-23 DIAGNOSIS — C220 Liver cell carcinoma: Secondary | ICD-10-CM | POA: Diagnosis not present

## 2023-02-23 DIAGNOSIS — I4891 Unspecified atrial fibrillation: Secondary | ICD-10-CM | POA: Diagnosis not present

## 2023-02-23 DIAGNOSIS — G4733 Obstructive sleep apnea (adult) (pediatric): Secondary | ICD-10-CM | POA: Diagnosis not present

## 2023-02-23 DIAGNOSIS — I10 Essential (primary) hypertension: Secondary | ICD-10-CM | POA: Diagnosis not present

## 2023-02-23 DIAGNOSIS — E785 Hyperlipidemia, unspecified: Secondary | ICD-10-CM | POA: Diagnosis not present

## 2023-02-24 DIAGNOSIS — I10 Essential (primary) hypertension: Secondary | ICD-10-CM | POA: Diagnosis not present

## 2023-02-24 DIAGNOSIS — I4891 Unspecified atrial fibrillation: Secondary | ICD-10-CM | POA: Diagnosis not present

## 2023-02-24 DIAGNOSIS — C220 Liver cell carcinoma: Secondary | ICD-10-CM | POA: Diagnosis not present

## 2023-02-24 DIAGNOSIS — G4733 Obstructive sleep apnea (adult) (pediatric): Secondary | ICD-10-CM | POA: Diagnosis not present

## 2023-02-24 DIAGNOSIS — E785 Hyperlipidemia, unspecified: Secondary | ICD-10-CM | POA: Diagnosis not present

## 2023-02-25 DIAGNOSIS — C220 Liver cell carcinoma: Secondary | ICD-10-CM | POA: Diagnosis not present

## 2023-02-25 DIAGNOSIS — E785 Hyperlipidemia, unspecified: Secondary | ICD-10-CM | POA: Diagnosis not present

## 2023-02-25 DIAGNOSIS — I10 Essential (primary) hypertension: Secondary | ICD-10-CM | POA: Diagnosis not present

## 2023-02-25 DIAGNOSIS — G4733 Obstructive sleep apnea (adult) (pediatric): Secondary | ICD-10-CM | POA: Diagnosis not present

## 2023-02-25 DIAGNOSIS — I4891 Unspecified atrial fibrillation: Secondary | ICD-10-CM | POA: Diagnosis not present

## 2023-02-26 DIAGNOSIS — I10 Essential (primary) hypertension: Secondary | ICD-10-CM | POA: Diagnosis not present

## 2023-02-26 DIAGNOSIS — I4891 Unspecified atrial fibrillation: Secondary | ICD-10-CM | POA: Diagnosis not present

## 2023-02-26 DIAGNOSIS — G4733 Obstructive sleep apnea (adult) (pediatric): Secondary | ICD-10-CM | POA: Diagnosis not present

## 2023-02-26 DIAGNOSIS — E785 Hyperlipidemia, unspecified: Secondary | ICD-10-CM | POA: Diagnosis not present

## 2023-02-26 DIAGNOSIS — C220 Liver cell carcinoma: Secondary | ICD-10-CM | POA: Diagnosis not present

## 2023-02-27 DIAGNOSIS — C220 Liver cell carcinoma: Secondary | ICD-10-CM | POA: Diagnosis not present

## 2023-02-27 DIAGNOSIS — E785 Hyperlipidemia, unspecified: Secondary | ICD-10-CM | POA: Diagnosis not present

## 2023-02-27 DIAGNOSIS — G4733 Obstructive sleep apnea (adult) (pediatric): Secondary | ICD-10-CM | POA: Diagnosis not present

## 2023-02-27 DIAGNOSIS — I10 Essential (primary) hypertension: Secondary | ICD-10-CM | POA: Diagnosis not present

## 2023-02-27 DIAGNOSIS — I4891 Unspecified atrial fibrillation: Secondary | ICD-10-CM | POA: Diagnosis not present

## 2023-02-28 DIAGNOSIS — E785 Hyperlipidemia, unspecified: Secondary | ICD-10-CM | POA: Diagnosis not present

## 2023-02-28 DIAGNOSIS — G4733 Obstructive sleep apnea (adult) (pediatric): Secondary | ICD-10-CM | POA: Diagnosis not present

## 2023-02-28 DIAGNOSIS — C220 Liver cell carcinoma: Secondary | ICD-10-CM | POA: Diagnosis not present

## 2023-02-28 DIAGNOSIS — I10 Essential (primary) hypertension: Secondary | ICD-10-CM | POA: Diagnosis not present

## 2023-02-28 DIAGNOSIS — I4891 Unspecified atrial fibrillation: Secondary | ICD-10-CM | POA: Diagnosis not present

## 2023-03-01 DIAGNOSIS — I10 Essential (primary) hypertension: Secondary | ICD-10-CM | POA: Diagnosis not present

## 2023-03-01 DIAGNOSIS — I4891 Unspecified atrial fibrillation: Secondary | ICD-10-CM | POA: Diagnosis not present

## 2023-03-01 DIAGNOSIS — C220 Liver cell carcinoma: Secondary | ICD-10-CM | POA: Diagnosis not present

## 2023-03-01 DIAGNOSIS — G4733 Obstructive sleep apnea (adult) (pediatric): Secondary | ICD-10-CM | POA: Diagnosis not present

## 2023-03-01 DIAGNOSIS — E785 Hyperlipidemia, unspecified: Secondary | ICD-10-CM | POA: Diagnosis not present

## 2023-03-02 DIAGNOSIS — G4733 Obstructive sleep apnea (adult) (pediatric): Secondary | ICD-10-CM | POA: Diagnosis not present

## 2023-03-02 DIAGNOSIS — C220 Liver cell carcinoma: Secondary | ICD-10-CM | POA: Diagnosis not present

## 2023-03-02 DIAGNOSIS — I4891 Unspecified atrial fibrillation: Secondary | ICD-10-CM | POA: Diagnosis not present

## 2023-03-02 DIAGNOSIS — E785 Hyperlipidemia, unspecified: Secondary | ICD-10-CM | POA: Diagnosis not present

## 2023-03-02 DIAGNOSIS — I10 Essential (primary) hypertension: Secondary | ICD-10-CM | POA: Diagnosis not present

## 2023-03-03 DIAGNOSIS — I4891 Unspecified atrial fibrillation: Secondary | ICD-10-CM | POA: Diagnosis not present

## 2023-03-03 DIAGNOSIS — G4733 Obstructive sleep apnea (adult) (pediatric): Secondary | ICD-10-CM | POA: Diagnosis not present

## 2023-03-03 DIAGNOSIS — I10 Essential (primary) hypertension: Secondary | ICD-10-CM | POA: Diagnosis not present

## 2023-03-03 DIAGNOSIS — C220 Liver cell carcinoma: Secondary | ICD-10-CM | POA: Diagnosis not present

## 2023-03-03 DIAGNOSIS — E785 Hyperlipidemia, unspecified: Secondary | ICD-10-CM | POA: Diagnosis not present

## 2023-03-04 DIAGNOSIS — G4733 Obstructive sleep apnea (adult) (pediatric): Secondary | ICD-10-CM | POA: Diagnosis not present

## 2023-03-04 DIAGNOSIS — I10 Essential (primary) hypertension: Secondary | ICD-10-CM | POA: Diagnosis not present

## 2023-03-04 DIAGNOSIS — I4891 Unspecified atrial fibrillation: Secondary | ICD-10-CM | POA: Diagnosis not present

## 2023-03-04 DIAGNOSIS — E785 Hyperlipidemia, unspecified: Secondary | ICD-10-CM | POA: Diagnosis not present

## 2023-03-04 DIAGNOSIS — C220 Liver cell carcinoma: Secondary | ICD-10-CM | POA: Diagnosis not present

## 2023-03-05 DIAGNOSIS — G4733 Obstructive sleep apnea (adult) (pediatric): Secondary | ICD-10-CM | POA: Diagnosis not present

## 2023-03-05 DIAGNOSIS — E785 Hyperlipidemia, unspecified: Secondary | ICD-10-CM | POA: Diagnosis not present

## 2023-03-05 DIAGNOSIS — I10 Essential (primary) hypertension: Secondary | ICD-10-CM | POA: Diagnosis not present

## 2023-03-05 DIAGNOSIS — C220 Liver cell carcinoma: Secondary | ICD-10-CM | POA: Diagnosis not present

## 2023-03-05 DIAGNOSIS — H26492 Other secondary cataract, left eye: Secondary | ICD-10-CM | POA: Diagnosis not present

## 2023-03-05 DIAGNOSIS — I4891 Unspecified atrial fibrillation: Secondary | ICD-10-CM | POA: Diagnosis not present

## 2023-03-06 DIAGNOSIS — Z6825 Body mass index (BMI) 25.0-25.9, adult: Secondary | ICD-10-CM | POA: Diagnosis not present

## 2023-03-06 DIAGNOSIS — Z9989 Dependence on other enabling machines and devices: Secondary | ICD-10-CM | POA: Diagnosis not present

## 2023-03-06 DIAGNOSIS — G4733 Obstructive sleep apnea (adult) (pediatric): Secondary | ICD-10-CM | POA: Diagnosis not present

## 2023-03-06 DIAGNOSIS — C220 Liver cell carcinoma: Secondary | ICD-10-CM | POA: Diagnosis not present

## 2023-03-06 DIAGNOSIS — I4891 Unspecified atrial fibrillation: Secondary | ICD-10-CM | POA: Diagnosis not present

## 2023-03-06 DIAGNOSIS — E785 Hyperlipidemia, unspecified: Secondary | ICD-10-CM | POA: Diagnosis not present

## 2023-03-06 DIAGNOSIS — I10 Essential (primary) hypertension: Secondary | ICD-10-CM | POA: Diagnosis not present

## 2023-03-06 DIAGNOSIS — N400 Enlarged prostate without lower urinary tract symptoms: Secondary | ICD-10-CM | POA: Diagnosis not present

## 2023-03-06 DIAGNOSIS — Z8739 Personal history of other diseases of the musculoskeletal system and connective tissue: Secondary | ICD-10-CM | POA: Diagnosis not present

## 2023-03-09 DIAGNOSIS — G4733 Obstructive sleep apnea (adult) (pediatric): Secondary | ICD-10-CM | POA: Diagnosis not present

## 2023-03-09 DIAGNOSIS — I10 Essential (primary) hypertension: Secondary | ICD-10-CM | POA: Diagnosis not present

## 2023-03-09 DIAGNOSIS — E785 Hyperlipidemia, unspecified: Secondary | ICD-10-CM | POA: Diagnosis not present

## 2023-03-09 DIAGNOSIS — I4891 Unspecified atrial fibrillation: Secondary | ICD-10-CM | POA: Diagnosis not present

## 2023-03-09 DIAGNOSIS — C220 Liver cell carcinoma: Secondary | ICD-10-CM | POA: Diagnosis not present

## 2023-03-13 ENCOUNTER — Other Ambulatory Visit: Payer: Self-pay | Admitting: *Deleted

## 2023-03-13 DIAGNOSIS — C220 Liver cell carcinoma: Secondary | ICD-10-CM

## 2023-03-14 ENCOUNTER — Inpatient Hospital Stay: Payer: Medicare Other | Attending: Hematology

## 2023-03-14 ENCOUNTER — Inpatient Hospital Stay: Payer: Medicare Other | Admitting: Hematology

## 2023-03-16 DIAGNOSIS — G4733 Obstructive sleep apnea (adult) (pediatric): Secondary | ICD-10-CM | POA: Diagnosis not present

## 2023-03-16 DIAGNOSIS — E785 Hyperlipidemia, unspecified: Secondary | ICD-10-CM | POA: Diagnosis not present

## 2023-03-16 DIAGNOSIS — I4891 Unspecified atrial fibrillation: Secondary | ICD-10-CM | POA: Diagnosis not present

## 2023-03-16 DIAGNOSIS — I10 Essential (primary) hypertension: Secondary | ICD-10-CM | POA: Diagnosis not present

## 2023-03-16 DIAGNOSIS — C220 Liver cell carcinoma: Secondary | ICD-10-CM | POA: Diagnosis not present

## 2023-03-22 DIAGNOSIS — D45 Polycythemia vera: Secondary | ICD-10-CM | POA: Diagnosis not present

## 2023-03-23 DIAGNOSIS — I10 Essential (primary) hypertension: Secondary | ICD-10-CM | POA: Diagnosis not present

## 2023-03-23 DIAGNOSIS — G4733 Obstructive sleep apnea (adult) (pediatric): Secondary | ICD-10-CM | POA: Diagnosis not present

## 2023-03-23 DIAGNOSIS — I4891 Unspecified atrial fibrillation: Secondary | ICD-10-CM | POA: Diagnosis not present

## 2023-03-23 DIAGNOSIS — C220 Liver cell carcinoma: Secondary | ICD-10-CM | POA: Diagnosis not present

## 2023-03-23 DIAGNOSIS — E785 Hyperlipidemia, unspecified: Secondary | ICD-10-CM | POA: Diagnosis not present

## 2023-03-29 DIAGNOSIS — Z23 Encounter for immunization: Secondary | ICD-10-CM | POA: Diagnosis not present

## 2023-03-29 DIAGNOSIS — D45 Polycythemia vera: Secondary | ICD-10-CM | POA: Diagnosis not present

## 2023-03-29 DIAGNOSIS — C220 Liver cell carcinoma: Secondary | ICD-10-CM | POA: Diagnosis not present

## 2023-03-30 DIAGNOSIS — G4733 Obstructive sleep apnea (adult) (pediatric): Secondary | ICD-10-CM | POA: Diagnosis not present

## 2023-03-30 DIAGNOSIS — I10 Essential (primary) hypertension: Secondary | ICD-10-CM | POA: Diagnosis not present

## 2023-03-30 DIAGNOSIS — I4891 Unspecified atrial fibrillation: Secondary | ICD-10-CM | POA: Diagnosis not present

## 2023-03-30 DIAGNOSIS — E785 Hyperlipidemia, unspecified: Secondary | ICD-10-CM | POA: Diagnosis not present

## 2023-03-30 DIAGNOSIS — C220 Liver cell carcinoma: Secondary | ICD-10-CM | POA: Diagnosis not present

## 2023-04-06 DIAGNOSIS — C220 Liver cell carcinoma: Secondary | ICD-10-CM | POA: Diagnosis not present

## 2023-04-06 DIAGNOSIS — G4733 Obstructive sleep apnea (adult) (pediatric): Secondary | ICD-10-CM | POA: Diagnosis not present

## 2023-04-06 DIAGNOSIS — I10 Essential (primary) hypertension: Secondary | ICD-10-CM | POA: Diagnosis not present

## 2023-04-06 DIAGNOSIS — I4891 Unspecified atrial fibrillation: Secondary | ICD-10-CM | POA: Diagnosis not present

## 2023-04-06 DIAGNOSIS — Z8739 Personal history of other diseases of the musculoskeletal system and connective tissue: Secondary | ICD-10-CM | POA: Diagnosis not present

## 2023-04-06 DIAGNOSIS — E785 Hyperlipidemia, unspecified: Secondary | ICD-10-CM | POA: Diagnosis not present

## 2023-04-06 DIAGNOSIS — Z6825 Body mass index (BMI) 25.0-25.9, adult: Secondary | ICD-10-CM | POA: Diagnosis not present

## 2023-04-06 DIAGNOSIS — Z9989 Dependence on other enabling machines and devices: Secondary | ICD-10-CM | POA: Diagnosis not present

## 2023-04-06 DIAGNOSIS — N400 Enlarged prostate without lower urinary tract symptoms: Secondary | ICD-10-CM | POA: Diagnosis not present

## 2023-04-13 DIAGNOSIS — I10 Essential (primary) hypertension: Secondary | ICD-10-CM | POA: Diagnosis not present

## 2023-04-13 DIAGNOSIS — E785 Hyperlipidemia, unspecified: Secondary | ICD-10-CM | POA: Diagnosis not present

## 2023-04-13 DIAGNOSIS — G4733 Obstructive sleep apnea (adult) (pediatric): Secondary | ICD-10-CM | POA: Diagnosis not present

## 2023-04-13 DIAGNOSIS — I4891 Unspecified atrial fibrillation: Secondary | ICD-10-CM | POA: Diagnosis not present

## 2023-04-13 DIAGNOSIS — C220 Liver cell carcinoma: Secondary | ICD-10-CM | POA: Diagnosis not present

## 2023-04-20 DIAGNOSIS — I4891 Unspecified atrial fibrillation: Secondary | ICD-10-CM | POA: Diagnosis not present

## 2023-04-20 DIAGNOSIS — C220 Liver cell carcinoma: Secondary | ICD-10-CM | POA: Diagnosis not present

## 2023-04-20 DIAGNOSIS — I10 Essential (primary) hypertension: Secondary | ICD-10-CM | POA: Diagnosis not present

## 2023-04-20 DIAGNOSIS — G4733 Obstructive sleep apnea (adult) (pediatric): Secondary | ICD-10-CM | POA: Diagnosis not present

## 2023-04-20 DIAGNOSIS — E785 Hyperlipidemia, unspecified: Secondary | ICD-10-CM | POA: Diagnosis not present

## 2023-04-27 DIAGNOSIS — C220 Liver cell carcinoma: Secondary | ICD-10-CM | POA: Diagnosis not present

## 2023-04-27 DIAGNOSIS — E785 Hyperlipidemia, unspecified: Secondary | ICD-10-CM | POA: Diagnosis not present

## 2023-04-27 DIAGNOSIS — I10 Essential (primary) hypertension: Secondary | ICD-10-CM | POA: Diagnosis not present

## 2023-04-27 DIAGNOSIS — G4733 Obstructive sleep apnea (adult) (pediatric): Secondary | ICD-10-CM | POA: Diagnosis not present

## 2023-04-27 DIAGNOSIS — I4891 Unspecified atrial fibrillation: Secondary | ICD-10-CM | POA: Diagnosis not present

## 2023-05-04 DIAGNOSIS — E785 Hyperlipidemia, unspecified: Secondary | ICD-10-CM | POA: Diagnosis not present

## 2023-05-04 DIAGNOSIS — I4891 Unspecified atrial fibrillation: Secondary | ICD-10-CM | POA: Diagnosis not present

## 2023-05-04 DIAGNOSIS — I10 Essential (primary) hypertension: Secondary | ICD-10-CM | POA: Diagnosis not present

## 2023-05-04 DIAGNOSIS — C220 Liver cell carcinoma: Secondary | ICD-10-CM | POA: Diagnosis not present

## 2023-05-04 DIAGNOSIS — G4733 Obstructive sleep apnea (adult) (pediatric): Secondary | ICD-10-CM | POA: Diagnosis not present

## 2023-05-06 DIAGNOSIS — N400 Enlarged prostate without lower urinary tract symptoms: Secondary | ICD-10-CM | POA: Diagnosis not present

## 2023-05-06 DIAGNOSIS — C220 Liver cell carcinoma: Secondary | ICD-10-CM | POA: Diagnosis not present

## 2023-05-06 DIAGNOSIS — E785 Hyperlipidemia, unspecified: Secondary | ICD-10-CM | POA: Diagnosis not present

## 2023-05-06 DIAGNOSIS — I10 Essential (primary) hypertension: Secondary | ICD-10-CM | POA: Diagnosis not present

## 2023-05-06 DIAGNOSIS — I4891 Unspecified atrial fibrillation: Secondary | ICD-10-CM | POA: Diagnosis not present

## 2023-05-06 DIAGNOSIS — Z8739 Personal history of other diseases of the musculoskeletal system and connective tissue: Secondary | ICD-10-CM | POA: Diagnosis not present

## 2023-05-06 DIAGNOSIS — Z6825 Body mass index (BMI) 25.0-25.9, adult: Secondary | ICD-10-CM | POA: Diagnosis not present

## 2023-05-06 DIAGNOSIS — Z9989 Dependence on other enabling machines and devices: Secondary | ICD-10-CM | POA: Diagnosis not present

## 2023-05-06 DIAGNOSIS — G4733 Obstructive sleep apnea (adult) (pediatric): Secondary | ICD-10-CM | POA: Diagnosis not present

## 2023-05-11 DIAGNOSIS — I10 Essential (primary) hypertension: Secondary | ICD-10-CM | POA: Diagnosis not present

## 2023-05-11 DIAGNOSIS — G4733 Obstructive sleep apnea (adult) (pediatric): Secondary | ICD-10-CM | POA: Diagnosis not present

## 2023-05-11 DIAGNOSIS — C220 Liver cell carcinoma: Secondary | ICD-10-CM | POA: Diagnosis not present

## 2023-05-11 DIAGNOSIS — E785 Hyperlipidemia, unspecified: Secondary | ICD-10-CM | POA: Diagnosis not present

## 2023-05-11 DIAGNOSIS — I4891 Unspecified atrial fibrillation: Secondary | ICD-10-CM | POA: Diagnosis not present

## 2023-05-18 DIAGNOSIS — C220 Liver cell carcinoma: Secondary | ICD-10-CM | POA: Diagnosis not present

## 2023-05-18 DIAGNOSIS — I4891 Unspecified atrial fibrillation: Secondary | ICD-10-CM | POA: Diagnosis not present

## 2023-05-18 DIAGNOSIS — G4733 Obstructive sleep apnea (adult) (pediatric): Secondary | ICD-10-CM | POA: Diagnosis not present

## 2023-05-18 DIAGNOSIS — E785 Hyperlipidemia, unspecified: Secondary | ICD-10-CM | POA: Diagnosis not present

## 2023-05-18 DIAGNOSIS — I10 Essential (primary) hypertension: Secondary | ICD-10-CM | POA: Diagnosis not present

## 2023-05-25 DIAGNOSIS — I4891 Unspecified atrial fibrillation: Secondary | ICD-10-CM | POA: Diagnosis not present

## 2023-05-25 DIAGNOSIS — C220 Liver cell carcinoma: Secondary | ICD-10-CM | POA: Diagnosis not present

## 2023-05-25 DIAGNOSIS — E785 Hyperlipidemia, unspecified: Secondary | ICD-10-CM | POA: Diagnosis not present

## 2023-05-25 DIAGNOSIS — I10 Essential (primary) hypertension: Secondary | ICD-10-CM | POA: Diagnosis not present

## 2023-05-25 DIAGNOSIS — G4733 Obstructive sleep apnea (adult) (pediatric): Secondary | ICD-10-CM | POA: Diagnosis not present

## 2023-06-01 DIAGNOSIS — C220 Liver cell carcinoma: Secondary | ICD-10-CM | POA: Diagnosis not present

## 2023-06-01 DIAGNOSIS — I10 Essential (primary) hypertension: Secondary | ICD-10-CM | POA: Diagnosis not present

## 2023-06-01 DIAGNOSIS — G4733 Obstructive sleep apnea (adult) (pediatric): Secondary | ICD-10-CM | POA: Diagnosis not present

## 2023-06-01 DIAGNOSIS — I4891 Unspecified atrial fibrillation: Secondary | ICD-10-CM | POA: Diagnosis not present

## 2023-06-01 DIAGNOSIS — E785 Hyperlipidemia, unspecified: Secondary | ICD-10-CM | POA: Diagnosis not present

## 2023-06-06 DIAGNOSIS — C220 Liver cell carcinoma: Secondary | ICD-10-CM | POA: Diagnosis not present

## 2023-06-06 DIAGNOSIS — I4891 Unspecified atrial fibrillation: Secondary | ICD-10-CM | POA: Diagnosis not present

## 2023-06-06 DIAGNOSIS — E785 Hyperlipidemia, unspecified: Secondary | ICD-10-CM | POA: Diagnosis not present

## 2023-06-06 DIAGNOSIS — I10 Essential (primary) hypertension: Secondary | ICD-10-CM | POA: Diagnosis not present

## 2023-06-06 DIAGNOSIS — Z6825 Body mass index (BMI) 25.0-25.9, adult: Secondary | ICD-10-CM | POA: Diagnosis not present

## 2023-06-06 DIAGNOSIS — N400 Enlarged prostate without lower urinary tract symptoms: Secondary | ICD-10-CM | POA: Diagnosis not present

## 2023-06-06 DIAGNOSIS — Z9989 Dependence on other enabling machines and devices: Secondary | ICD-10-CM | POA: Diagnosis not present

## 2023-06-06 DIAGNOSIS — Z8739 Personal history of other diseases of the musculoskeletal system and connective tissue: Secondary | ICD-10-CM | POA: Diagnosis not present

## 2023-06-06 DIAGNOSIS — G4733 Obstructive sleep apnea (adult) (pediatric): Secondary | ICD-10-CM | POA: Diagnosis not present

## 2023-06-15 DIAGNOSIS — C220 Liver cell carcinoma: Secondary | ICD-10-CM | POA: Diagnosis not present

## 2023-06-15 DIAGNOSIS — I10 Essential (primary) hypertension: Secondary | ICD-10-CM | POA: Diagnosis not present

## 2023-06-15 DIAGNOSIS — E785 Hyperlipidemia, unspecified: Secondary | ICD-10-CM | POA: Diagnosis not present

## 2023-06-15 DIAGNOSIS — G4733 Obstructive sleep apnea (adult) (pediatric): Secondary | ICD-10-CM | POA: Diagnosis not present

## 2023-06-15 DIAGNOSIS — I4891 Unspecified atrial fibrillation: Secondary | ICD-10-CM | POA: Diagnosis not present

## 2023-06-22 DIAGNOSIS — E785 Hyperlipidemia, unspecified: Secondary | ICD-10-CM | POA: Diagnosis not present

## 2023-06-22 DIAGNOSIS — I10 Essential (primary) hypertension: Secondary | ICD-10-CM | POA: Diagnosis not present

## 2023-06-22 DIAGNOSIS — C220 Liver cell carcinoma: Secondary | ICD-10-CM | POA: Diagnosis not present

## 2023-06-22 DIAGNOSIS — I4891 Unspecified atrial fibrillation: Secondary | ICD-10-CM | POA: Diagnosis not present

## 2023-06-22 DIAGNOSIS — G4733 Obstructive sleep apnea (adult) (pediatric): Secondary | ICD-10-CM | POA: Diagnosis not present

## 2023-06-29 DIAGNOSIS — E785 Hyperlipidemia, unspecified: Secondary | ICD-10-CM | POA: Diagnosis not present

## 2023-06-29 DIAGNOSIS — I10 Essential (primary) hypertension: Secondary | ICD-10-CM | POA: Diagnosis not present

## 2023-06-29 DIAGNOSIS — I4891 Unspecified atrial fibrillation: Secondary | ICD-10-CM | POA: Diagnosis not present

## 2023-06-29 DIAGNOSIS — G4733 Obstructive sleep apnea (adult) (pediatric): Secondary | ICD-10-CM | POA: Diagnosis not present

## 2023-06-29 DIAGNOSIS — C220 Liver cell carcinoma: Secondary | ICD-10-CM | POA: Diagnosis not present

## 2023-07-06 DIAGNOSIS — E785 Hyperlipidemia, unspecified: Secondary | ICD-10-CM | POA: Diagnosis not present

## 2023-07-06 DIAGNOSIS — G4733 Obstructive sleep apnea (adult) (pediatric): Secondary | ICD-10-CM | POA: Diagnosis not present

## 2023-07-06 DIAGNOSIS — C220 Liver cell carcinoma: Secondary | ICD-10-CM | POA: Diagnosis not present

## 2023-07-06 DIAGNOSIS — I4891 Unspecified atrial fibrillation: Secondary | ICD-10-CM | POA: Diagnosis not present

## 2023-07-06 DIAGNOSIS — I10 Essential (primary) hypertension: Secondary | ICD-10-CM | POA: Diagnosis not present

## 2023-07-07 DIAGNOSIS — I4891 Unspecified atrial fibrillation: Secondary | ICD-10-CM | POA: Diagnosis not present

## 2023-07-07 DIAGNOSIS — N400 Enlarged prostate without lower urinary tract symptoms: Secondary | ICD-10-CM | POA: Diagnosis not present

## 2023-07-07 DIAGNOSIS — I10 Essential (primary) hypertension: Secondary | ICD-10-CM | POA: Diagnosis not present

## 2023-07-07 DIAGNOSIS — Z9989 Dependence on other enabling machines and devices: Secondary | ICD-10-CM | POA: Diagnosis not present

## 2023-07-07 DIAGNOSIS — G4733 Obstructive sleep apnea (adult) (pediatric): Secondary | ICD-10-CM | POA: Diagnosis not present

## 2023-07-07 DIAGNOSIS — C220 Liver cell carcinoma: Secondary | ICD-10-CM | POA: Diagnosis not present

## 2023-07-07 DIAGNOSIS — Z6825 Body mass index (BMI) 25.0-25.9, adult: Secondary | ICD-10-CM | POA: Diagnosis not present

## 2023-07-07 DIAGNOSIS — Z8739 Personal history of other diseases of the musculoskeletal system and connective tissue: Secondary | ICD-10-CM | POA: Diagnosis not present

## 2023-07-07 DIAGNOSIS — E785 Hyperlipidemia, unspecified: Secondary | ICD-10-CM | POA: Diagnosis not present

## 2023-07-13 DIAGNOSIS — G4733 Obstructive sleep apnea (adult) (pediatric): Secondary | ICD-10-CM | POA: Diagnosis not present

## 2023-07-13 DIAGNOSIS — I4891 Unspecified atrial fibrillation: Secondary | ICD-10-CM | POA: Diagnosis not present

## 2023-07-13 DIAGNOSIS — I10 Essential (primary) hypertension: Secondary | ICD-10-CM | POA: Diagnosis not present

## 2023-07-13 DIAGNOSIS — C220 Liver cell carcinoma: Secondary | ICD-10-CM | POA: Diagnosis not present

## 2023-07-13 DIAGNOSIS — E785 Hyperlipidemia, unspecified: Secondary | ICD-10-CM | POA: Diagnosis not present

## 2023-07-16 DIAGNOSIS — I4891 Unspecified atrial fibrillation: Secondary | ICD-10-CM | POA: Diagnosis not present

## 2023-07-16 DIAGNOSIS — G4733 Obstructive sleep apnea (adult) (pediatric): Secondary | ICD-10-CM | POA: Diagnosis not present

## 2023-07-16 DIAGNOSIS — C220 Liver cell carcinoma: Secondary | ICD-10-CM | POA: Diagnosis not present

## 2023-07-16 DIAGNOSIS — I10 Essential (primary) hypertension: Secondary | ICD-10-CM | POA: Diagnosis not present

## 2023-07-16 DIAGNOSIS — E785 Hyperlipidemia, unspecified: Secondary | ICD-10-CM | POA: Diagnosis not present

## 2023-07-20 DIAGNOSIS — E785 Hyperlipidemia, unspecified: Secondary | ICD-10-CM | POA: Diagnosis not present

## 2023-07-20 DIAGNOSIS — G4733 Obstructive sleep apnea (adult) (pediatric): Secondary | ICD-10-CM | POA: Diagnosis not present

## 2023-07-20 DIAGNOSIS — I10 Essential (primary) hypertension: Secondary | ICD-10-CM | POA: Diagnosis not present

## 2023-07-20 DIAGNOSIS — I4891 Unspecified atrial fibrillation: Secondary | ICD-10-CM | POA: Diagnosis not present

## 2023-07-20 DIAGNOSIS — C220 Liver cell carcinoma: Secondary | ICD-10-CM | POA: Diagnosis not present

## 2023-07-22 DIAGNOSIS — C220 Liver cell carcinoma: Secondary | ICD-10-CM | POA: Diagnosis not present

## 2023-07-22 DIAGNOSIS — I4891 Unspecified atrial fibrillation: Secondary | ICD-10-CM | POA: Diagnosis not present

## 2023-07-22 DIAGNOSIS — E785 Hyperlipidemia, unspecified: Secondary | ICD-10-CM | POA: Diagnosis not present

## 2023-07-22 DIAGNOSIS — G4733 Obstructive sleep apnea (adult) (pediatric): Secondary | ICD-10-CM | POA: Diagnosis not present

## 2023-07-22 DIAGNOSIS — I10 Essential (primary) hypertension: Secondary | ICD-10-CM | POA: Diagnosis not present

## 2023-07-23 DIAGNOSIS — E785 Hyperlipidemia, unspecified: Secondary | ICD-10-CM | POA: Diagnosis not present

## 2023-07-23 DIAGNOSIS — I10 Essential (primary) hypertension: Secondary | ICD-10-CM | POA: Diagnosis not present

## 2023-07-23 DIAGNOSIS — C220 Liver cell carcinoma: Secondary | ICD-10-CM | POA: Diagnosis not present

## 2023-07-23 DIAGNOSIS — G4733 Obstructive sleep apnea (adult) (pediatric): Secondary | ICD-10-CM | POA: Diagnosis not present

## 2023-07-23 DIAGNOSIS — I4891 Unspecified atrial fibrillation: Secondary | ICD-10-CM | POA: Diagnosis not present

## 2023-07-27 DIAGNOSIS — C220 Liver cell carcinoma: Secondary | ICD-10-CM | POA: Diagnosis not present

## 2023-07-27 DIAGNOSIS — E785 Hyperlipidemia, unspecified: Secondary | ICD-10-CM | POA: Diagnosis not present

## 2023-07-27 DIAGNOSIS — G4733 Obstructive sleep apnea (adult) (pediatric): Secondary | ICD-10-CM | POA: Diagnosis not present

## 2023-07-27 DIAGNOSIS — I10 Essential (primary) hypertension: Secondary | ICD-10-CM | POA: Diagnosis not present

## 2023-07-27 DIAGNOSIS — I4891 Unspecified atrial fibrillation: Secondary | ICD-10-CM | POA: Diagnosis not present

## 2023-07-28 DIAGNOSIS — G4733 Obstructive sleep apnea (adult) (pediatric): Secondary | ICD-10-CM | POA: Diagnosis not present

## 2023-07-28 DIAGNOSIS — C220 Liver cell carcinoma: Secondary | ICD-10-CM | POA: Diagnosis not present

## 2023-07-28 DIAGNOSIS — I4891 Unspecified atrial fibrillation: Secondary | ICD-10-CM | POA: Diagnosis not present

## 2023-07-28 DIAGNOSIS — E785 Hyperlipidemia, unspecified: Secondary | ICD-10-CM | POA: Diagnosis not present

## 2023-07-28 DIAGNOSIS — I10 Essential (primary) hypertension: Secondary | ICD-10-CM | POA: Diagnosis not present

## 2023-07-30 DIAGNOSIS — I4891 Unspecified atrial fibrillation: Secondary | ICD-10-CM | POA: Diagnosis not present

## 2023-07-30 DIAGNOSIS — C220 Liver cell carcinoma: Secondary | ICD-10-CM | POA: Diagnosis not present

## 2023-07-30 DIAGNOSIS — G4733 Obstructive sleep apnea (adult) (pediatric): Secondary | ICD-10-CM | POA: Diagnosis not present

## 2023-07-30 DIAGNOSIS — I10 Essential (primary) hypertension: Secondary | ICD-10-CM | POA: Diagnosis not present

## 2023-07-30 DIAGNOSIS — E785 Hyperlipidemia, unspecified: Secondary | ICD-10-CM | POA: Diagnosis not present

## 2023-08-03 DIAGNOSIS — C220 Liver cell carcinoma: Secondary | ICD-10-CM | POA: Diagnosis not present

## 2023-08-03 DIAGNOSIS — I4891 Unspecified atrial fibrillation: Secondary | ICD-10-CM | POA: Diagnosis not present

## 2023-08-03 DIAGNOSIS — G4733 Obstructive sleep apnea (adult) (pediatric): Secondary | ICD-10-CM | POA: Diagnosis not present

## 2023-08-03 DIAGNOSIS — E785 Hyperlipidemia, unspecified: Secondary | ICD-10-CM | POA: Diagnosis not present

## 2023-08-03 DIAGNOSIS — I10 Essential (primary) hypertension: Secondary | ICD-10-CM | POA: Diagnosis not present

## 2023-08-04 DIAGNOSIS — G4733 Obstructive sleep apnea (adult) (pediatric): Secondary | ICD-10-CM | POA: Diagnosis not present

## 2023-08-04 DIAGNOSIS — I4891 Unspecified atrial fibrillation: Secondary | ICD-10-CM | POA: Diagnosis not present

## 2023-08-04 DIAGNOSIS — I10 Essential (primary) hypertension: Secondary | ICD-10-CM | POA: Diagnosis not present

## 2023-08-04 DIAGNOSIS — E785 Hyperlipidemia, unspecified: Secondary | ICD-10-CM | POA: Diagnosis not present

## 2023-08-04 DIAGNOSIS — Z8739 Personal history of other diseases of the musculoskeletal system and connective tissue: Secondary | ICD-10-CM | POA: Diagnosis not present

## 2023-08-04 DIAGNOSIS — N400 Enlarged prostate without lower urinary tract symptoms: Secondary | ICD-10-CM | POA: Diagnosis not present

## 2023-08-04 DIAGNOSIS — Z9989 Dependence on other enabling machines and devices: Secondary | ICD-10-CM | POA: Diagnosis not present

## 2023-08-04 DIAGNOSIS — C220 Liver cell carcinoma: Secondary | ICD-10-CM | POA: Diagnosis not present

## 2023-08-04 DIAGNOSIS — Z6825 Body mass index (BMI) 25.0-25.9, adult: Secondary | ICD-10-CM | POA: Diagnosis not present

## 2023-08-05 DIAGNOSIS — C220 Liver cell carcinoma: Secondary | ICD-10-CM | POA: Diagnosis not present

## 2023-08-05 DIAGNOSIS — I10 Essential (primary) hypertension: Secondary | ICD-10-CM | POA: Diagnosis not present

## 2023-08-05 DIAGNOSIS — E785 Hyperlipidemia, unspecified: Secondary | ICD-10-CM | POA: Diagnosis not present

## 2023-08-05 DIAGNOSIS — I4891 Unspecified atrial fibrillation: Secondary | ICD-10-CM | POA: Diagnosis not present

## 2023-08-05 DIAGNOSIS — G4733 Obstructive sleep apnea (adult) (pediatric): Secondary | ICD-10-CM | POA: Diagnosis not present

## 2023-08-08 DIAGNOSIS — C220 Liver cell carcinoma: Secondary | ICD-10-CM | POA: Diagnosis not present

## 2023-08-08 DIAGNOSIS — E785 Hyperlipidemia, unspecified: Secondary | ICD-10-CM | POA: Diagnosis not present

## 2023-08-08 DIAGNOSIS — I10 Essential (primary) hypertension: Secondary | ICD-10-CM | POA: Diagnosis not present

## 2023-08-08 DIAGNOSIS — I4891 Unspecified atrial fibrillation: Secondary | ICD-10-CM | POA: Diagnosis not present

## 2023-08-08 DIAGNOSIS — G4733 Obstructive sleep apnea (adult) (pediatric): Secondary | ICD-10-CM | POA: Diagnosis not present

## 2023-08-13 DIAGNOSIS — C220 Liver cell carcinoma: Secondary | ICD-10-CM | POA: Diagnosis not present

## 2023-08-13 DIAGNOSIS — E785 Hyperlipidemia, unspecified: Secondary | ICD-10-CM | POA: Diagnosis not present

## 2023-08-13 DIAGNOSIS — I10 Essential (primary) hypertension: Secondary | ICD-10-CM | POA: Diagnosis not present

## 2023-08-13 DIAGNOSIS — I4891 Unspecified atrial fibrillation: Secondary | ICD-10-CM | POA: Diagnosis not present

## 2023-08-13 DIAGNOSIS — G4733 Obstructive sleep apnea (adult) (pediatric): Secondary | ICD-10-CM | POA: Diagnosis not present

## 2023-08-15 DIAGNOSIS — G4733 Obstructive sleep apnea (adult) (pediatric): Secondary | ICD-10-CM | POA: Diagnosis not present

## 2023-08-15 DIAGNOSIS — C220 Liver cell carcinoma: Secondary | ICD-10-CM | POA: Diagnosis not present

## 2023-08-15 DIAGNOSIS — I10 Essential (primary) hypertension: Secondary | ICD-10-CM | POA: Diagnosis not present

## 2023-08-15 DIAGNOSIS — I4891 Unspecified atrial fibrillation: Secondary | ICD-10-CM | POA: Diagnosis not present

## 2023-08-15 DIAGNOSIS — E785 Hyperlipidemia, unspecified: Secondary | ICD-10-CM | POA: Diagnosis not present

## 2023-08-24 DIAGNOSIS — I10 Essential (primary) hypertension: Secondary | ICD-10-CM | POA: Diagnosis not present

## 2023-08-24 DIAGNOSIS — C220 Liver cell carcinoma: Secondary | ICD-10-CM | POA: Diagnosis not present

## 2023-08-24 DIAGNOSIS — E785 Hyperlipidemia, unspecified: Secondary | ICD-10-CM | POA: Diagnosis not present

## 2023-08-24 DIAGNOSIS — I4891 Unspecified atrial fibrillation: Secondary | ICD-10-CM | POA: Diagnosis not present

## 2023-08-24 DIAGNOSIS — G4733 Obstructive sleep apnea (adult) (pediatric): Secondary | ICD-10-CM | POA: Diagnosis not present

## 2023-08-25 DIAGNOSIS — I4891 Unspecified atrial fibrillation: Secondary | ICD-10-CM | POA: Diagnosis not present

## 2023-08-25 DIAGNOSIS — C220 Liver cell carcinoma: Secondary | ICD-10-CM | POA: Diagnosis not present

## 2023-08-25 DIAGNOSIS — G4733 Obstructive sleep apnea (adult) (pediatric): Secondary | ICD-10-CM | POA: Diagnosis not present

## 2023-08-25 DIAGNOSIS — I10 Essential (primary) hypertension: Secondary | ICD-10-CM | POA: Diagnosis not present

## 2023-08-25 DIAGNOSIS — E785 Hyperlipidemia, unspecified: Secondary | ICD-10-CM | POA: Diagnosis not present

## 2023-08-26 DIAGNOSIS — I4891 Unspecified atrial fibrillation: Secondary | ICD-10-CM | POA: Diagnosis not present

## 2023-08-26 DIAGNOSIS — G4733 Obstructive sleep apnea (adult) (pediatric): Secondary | ICD-10-CM | POA: Diagnosis not present

## 2023-08-26 DIAGNOSIS — I10 Essential (primary) hypertension: Secondary | ICD-10-CM | POA: Diagnosis not present

## 2023-08-26 DIAGNOSIS — C220 Liver cell carcinoma: Secondary | ICD-10-CM | POA: Diagnosis not present

## 2023-08-26 DIAGNOSIS — E785 Hyperlipidemia, unspecified: Secondary | ICD-10-CM | POA: Diagnosis not present

## 2023-08-27 DIAGNOSIS — I10 Essential (primary) hypertension: Secondary | ICD-10-CM | POA: Diagnosis not present

## 2023-08-27 DIAGNOSIS — C220 Liver cell carcinoma: Secondary | ICD-10-CM | POA: Diagnosis not present

## 2023-08-27 DIAGNOSIS — E785 Hyperlipidemia, unspecified: Secondary | ICD-10-CM | POA: Diagnosis not present

## 2023-08-27 DIAGNOSIS — I4891 Unspecified atrial fibrillation: Secondary | ICD-10-CM | POA: Diagnosis not present

## 2023-08-27 DIAGNOSIS — G4733 Obstructive sleep apnea (adult) (pediatric): Secondary | ICD-10-CM | POA: Diagnosis not present

## 2023-08-28 DIAGNOSIS — I10 Essential (primary) hypertension: Secondary | ICD-10-CM | POA: Diagnosis not present

## 2023-08-28 DIAGNOSIS — G4733 Obstructive sleep apnea (adult) (pediatric): Secondary | ICD-10-CM | POA: Diagnosis not present

## 2023-08-28 DIAGNOSIS — I4891 Unspecified atrial fibrillation: Secondary | ICD-10-CM | POA: Diagnosis not present

## 2023-08-28 DIAGNOSIS — C220 Liver cell carcinoma: Secondary | ICD-10-CM | POA: Diagnosis not present

## 2023-08-28 DIAGNOSIS — E785 Hyperlipidemia, unspecified: Secondary | ICD-10-CM | POA: Diagnosis not present

## 2023-08-29 DIAGNOSIS — I10 Essential (primary) hypertension: Secondary | ICD-10-CM | POA: Diagnosis not present

## 2023-08-29 DIAGNOSIS — G4733 Obstructive sleep apnea (adult) (pediatric): Secondary | ICD-10-CM | POA: Diagnosis not present

## 2023-08-29 DIAGNOSIS — E785 Hyperlipidemia, unspecified: Secondary | ICD-10-CM | POA: Diagnosis not present

## 2023-08-29 DIAGNOSIS — C220 Liver cell carcinoma: Secondary | ICD-10-CM | POA: Diagnosis not present

## 2023-08-29 DIAGNOSIS — I4891 Unspecified atrial fibrillation: Secondary | ICD-10-CM | POA: Diagnosis not present

## 2023-09-04 DEATH — deceased
